# Patient Record
Sex: Male | Born: 1945 | Race: White | Hispanic: No | Smoking: Current every day smoker
Health system: Southern US, Community
[De-identification: ages and names within clinical notes are randomized; demographics above are authoritative.]

## PROBLEM LIST (undated history)

## (undated) DIAGNOSIS — I2510 Atherosclerotic heart disease of native coronary artery without angina pectoris: Secondary | ICD-10-CM

## (undated) DIAGNOSIS — I10 Essential (primary) hypertension: Secondary | ICD-10-CM

## (undated) DIAGNOSIS — R7303 Prediabetes: Secondary | ICD-10-CM

## (undated) DIAGNOSIS — E785 Hyperlipidemia, unspecified: Secondary | ICD-10-CM

## (undated) DIAGNOSIS — J45909 Unspecified asthma, uncomplicated: Secondary | ICD-10-CM

## (undated) DIAGNOSIS — I700 Atherosclerosis of aorta: Secondary | ICD-10-CM

## (undated) DIAGNOSIS — J449 Chronic obstructive pulmonary disease, unspecified: Secondary | ICD-10-CM

## (undated) DIAGNOSIS — M170 Bilateral primary osteoarthritis of knee: Secondary | ICD-10-CM

## (undated) DIAGNOSIS — R0600 Dyspnea, unspecified: Secondary | ICD-10-CM

## (undated) DIAGNOSIS — M1990 Unspecified osteoarthritis, unspecified site: Secondary | ICD-10-CM

## (undated) HISTORY — DX: Essential (primary) hypertension: I10

## (undated) HISTORY — PX: HERNIA REPAIR: SHX51

## (undated) HISTORY — DX: Atherosclerotic heart disease of native coronary artery without angina pectoris: I25.10

## (undated) HISTORY — PX: COLONOSCOPY: SHX174

## (undated) HISTORY — PX: APPENDECTOMY: SHX54

---

## 2018-10-03 ENCOUNTER — Encounter (INDEPENDENT_AMBULATORY_CARE_PROVIDER_SITE_OTHER): Payer: Self-pay

## 2018-10-24 ENCOUNTER — Encounter: Payer: Self-pay | Admitting: Family Medicine

## 2018-10-24 ENCOUNTER — Ambulatory Visit
Admission: RE | Admit: 2018-10-24 | Discharge: 2018-10-24 | Disposition: A | Discharge status: Home / Self Care | Payer: Medicare Other | Source: Ambulatory Visit | Attending: Family Medicine | Admitting: Family Medicine

## 2018-10-24 ENCOUNTER — Ambulatory Visit (INDEPENDENT_AMBULATORY_CARE_PROVIDER_SITE_OTHER): Payer: Medicare Other | Admitting: Family Medicine

## 2018-10-24 ENCOUNTER — Other Ambulatory Visit: Payer: Self-pay | Admitting: Family Medicine

## 2018-10-24 VITALS — BP 138/84 | HR 109 | Temp 98.2°F | Resp 16 | Ht 76.0 in | Wt 252.0 lb

## 2018-10-24 DIAGNOSIS — Z7689 Persons encountering health services in other specified circumstances: Secondary | ICD-10-CM | POA: Diagnosis not present

## 2018-10-24 DIAGNOSIS — J309 Allergic rhinitis, unspecified: Secondary | ICD-10-CM | POA: Insufficient documentation

## 2018-10-24 DIAGNOSIS — G8929 Other chronic pain: Secondary | ICD-10-CM

## 2018-10-24 DIAGNOSIS — M25562 Pain in left knee: Secondary | ICD-10-CM

## 2018-10-24 DIAGNOSIS — Z23 Encounter for immunization: Secondary | ICD-10-CM

## 2018-10-24 DIAGNOSIS — R030 Elevated blood-pressure reading, without diagnosis of hypertension: Secondary | ICD-10-CM | POA: Diagnosis not present

## 2018-10-24 DIAGNOSIS — M170 Bilateral primary osteoarthritis of knee: Secondary | ICD-10-CM

## 2018-10-24 DIAGNOSIS — N401 Enlarged prostate with lower urinary tract symptoms: Secondary | ICD-10-CM

## 2018-10-24 DIAGNOSIS — E669 Obesity, unspecified: Secondary | ICD-10-CM

## 2018-10-24 DIAGNOSIS — R7309 Other abnormal glucose: Secondary | ICD-10-CM

## 2018-10-24 DIAGNOSIS — M25561 Pain in right knee: Secondary | ICD-10-CM | POA: Diagnosis present

## 2018-10-24 DIAGNOSIS — J3089 Other allergic rhinitis: Secondary | ICD-10-CM

## 2018-10-24 DIAGNOSIS — N138 Other obstructive and reflux uropathy: Secondary | ICD-10-CM | POA: Insufficient documentation

## 2018-10-24 DIAGNOSIS — Z1159 Encounter for screening for other viral diseases: Secondary | ICD-10-CM

## 2018-10-24 DIAGNOSIS — M1712 Unilateral primary osteoarthritis, left knee: Secondary | ICD-10-CM | POA: Insufficient documentation

## 2018-10-24 DIAGNOSIS — I10 Essential (primary) hypertension: Secondary | ICD-10-CM | POA: Insufficient documentation

## 2018-10-24 DIAGNOSIS — Z0000 Encounter for general adult medical examination without abnormal findings: Secondary | ICD-10-CM

## 2018-10-24 DIAGNOSIS — E66811 Obesity, class 1: Secondary | ICD-10-CM

## 2018-10-24 IMAGING — DX DG KNEE COMPLETE 4+V*R*
4 series · 5 of 5 positions shown · non-contrast
Comparison: None.

CLINICAL DATA: Chronic right knee pain for years.

EXAM:
RIGHT KNEE - COMPLETE 4+ VIEW

[knee ap]
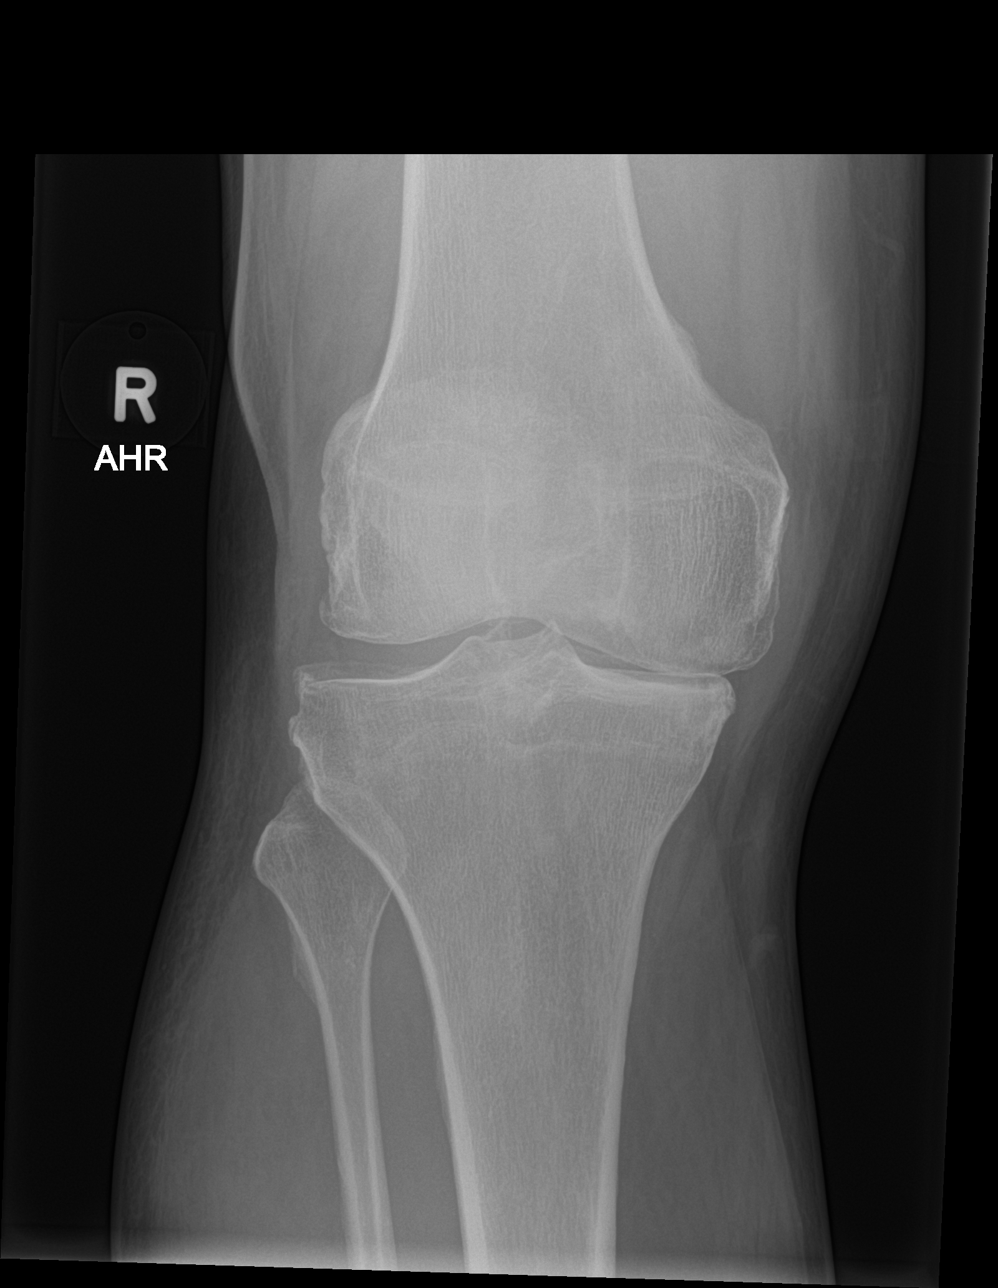

[knee tunnel]
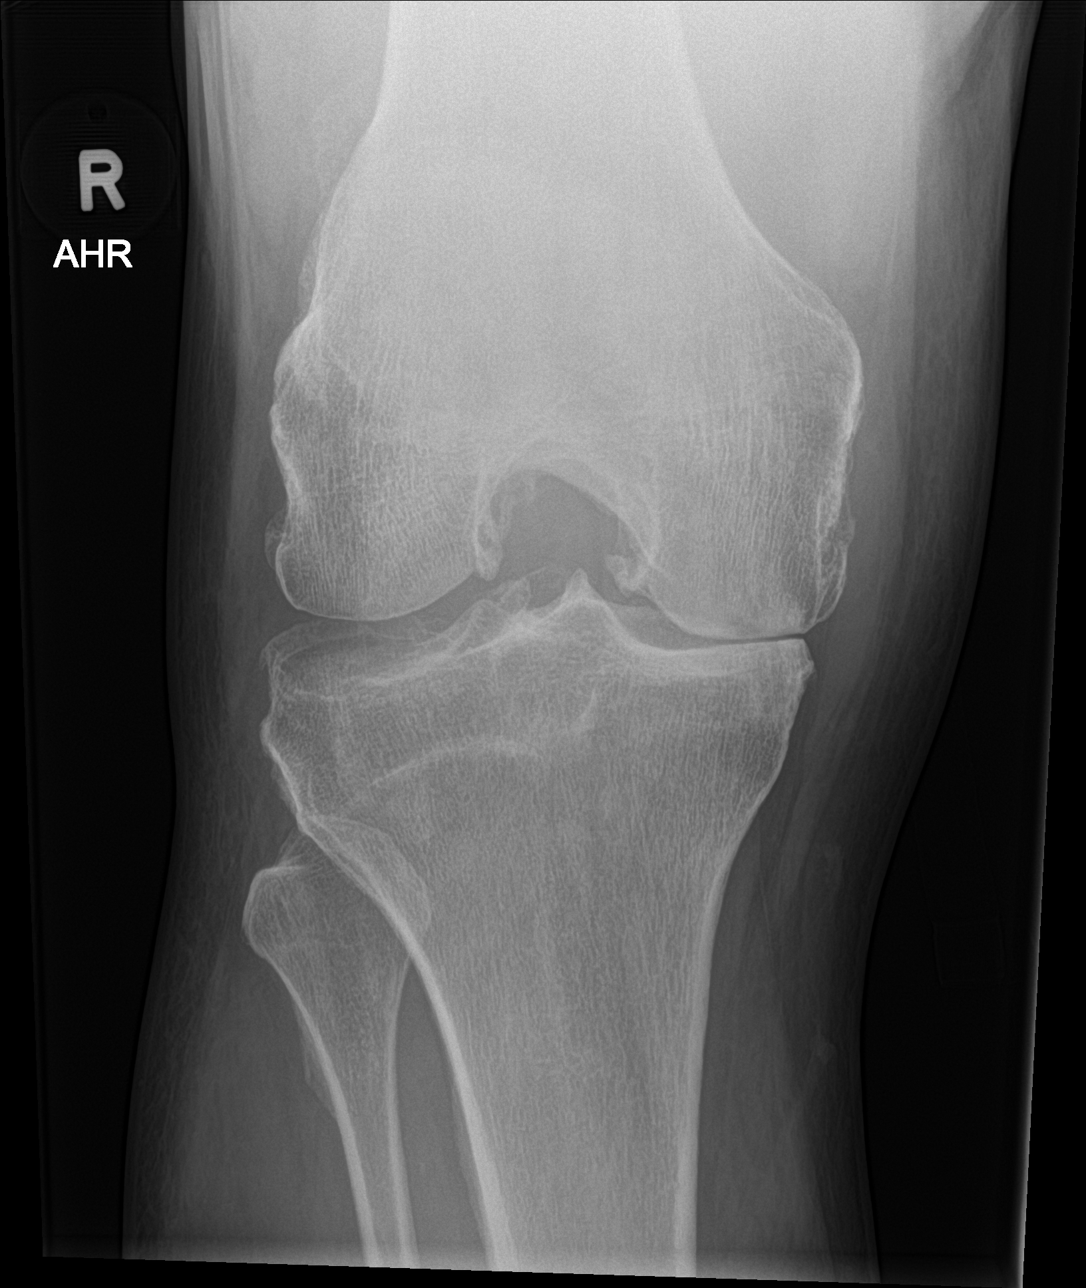

[knee lat]
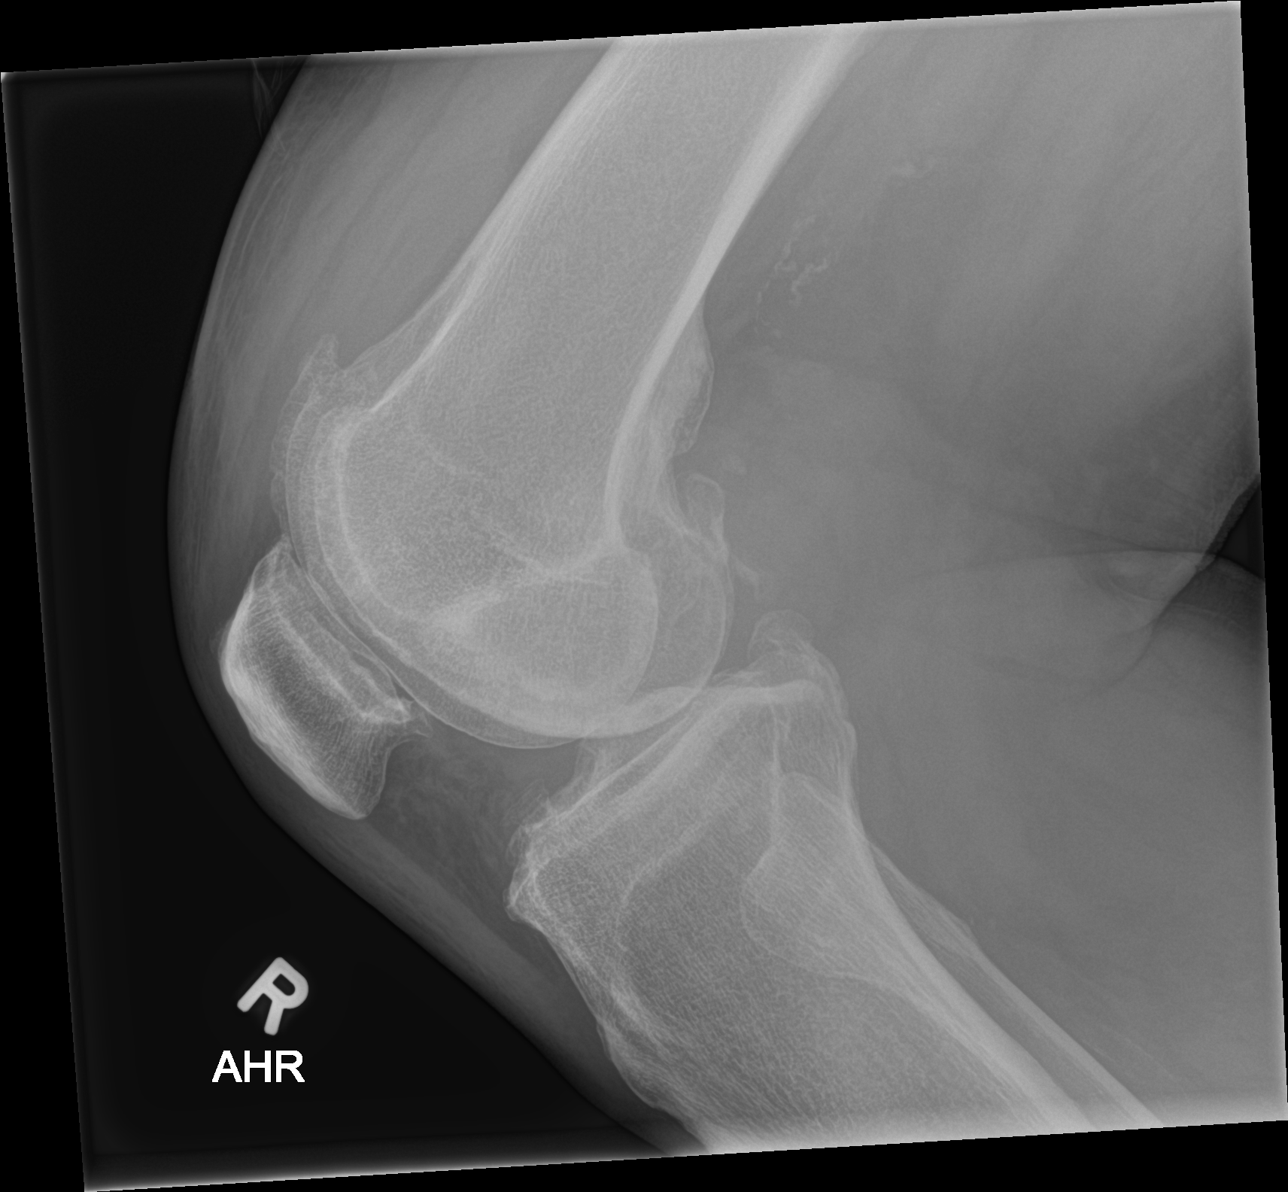

[Series 4: patella skyline · 0.14mm/px · 2 of 2 slices shown]
[im 1/2]
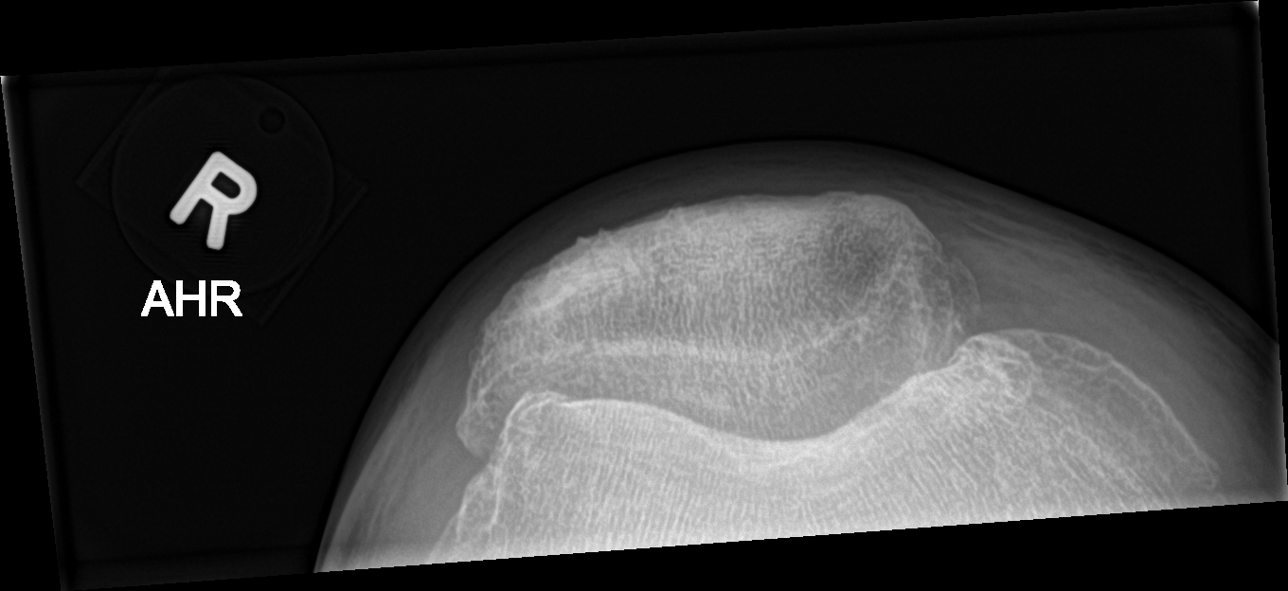
[im 2/2]
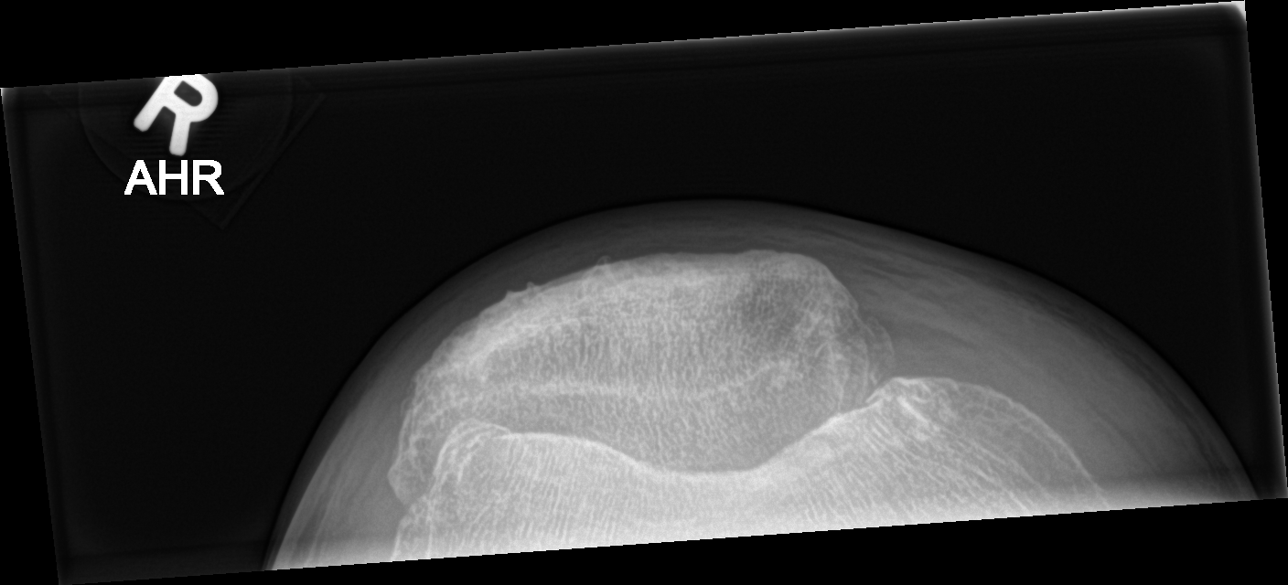

[5 of 5 positions shown; findings below may reference images not displayed]

FINDINGS: No fracture or bone lesion.

There is marked medial joint space compartment narrowing. There are
marginal osteophytes from all 3 compartments. Trace joint effusion.

Bones are demineralized.

There are posterior vascular calcifications. Soft tissues otherwise
unremarkable.
IMPRESSION: 1. No fracture or bone lesion.
2. Advanced osteoarthritis predominantly affecting the medial
compartment. Trace joint effusion.

## 2018-10-24 IMAGING — DX DG KNEE STANDING AP BILAT
1 series · 1 of 1 positions shown · non-contrast
Comparison: None.

CLINICAL DATA: Pt c/o bilateral knee pain, R > L, chronic wear and
tear, suspected osteo arthritis

EXAM:
BILATERAL KNEES STANDING - 1 VIEW

[knee ap]
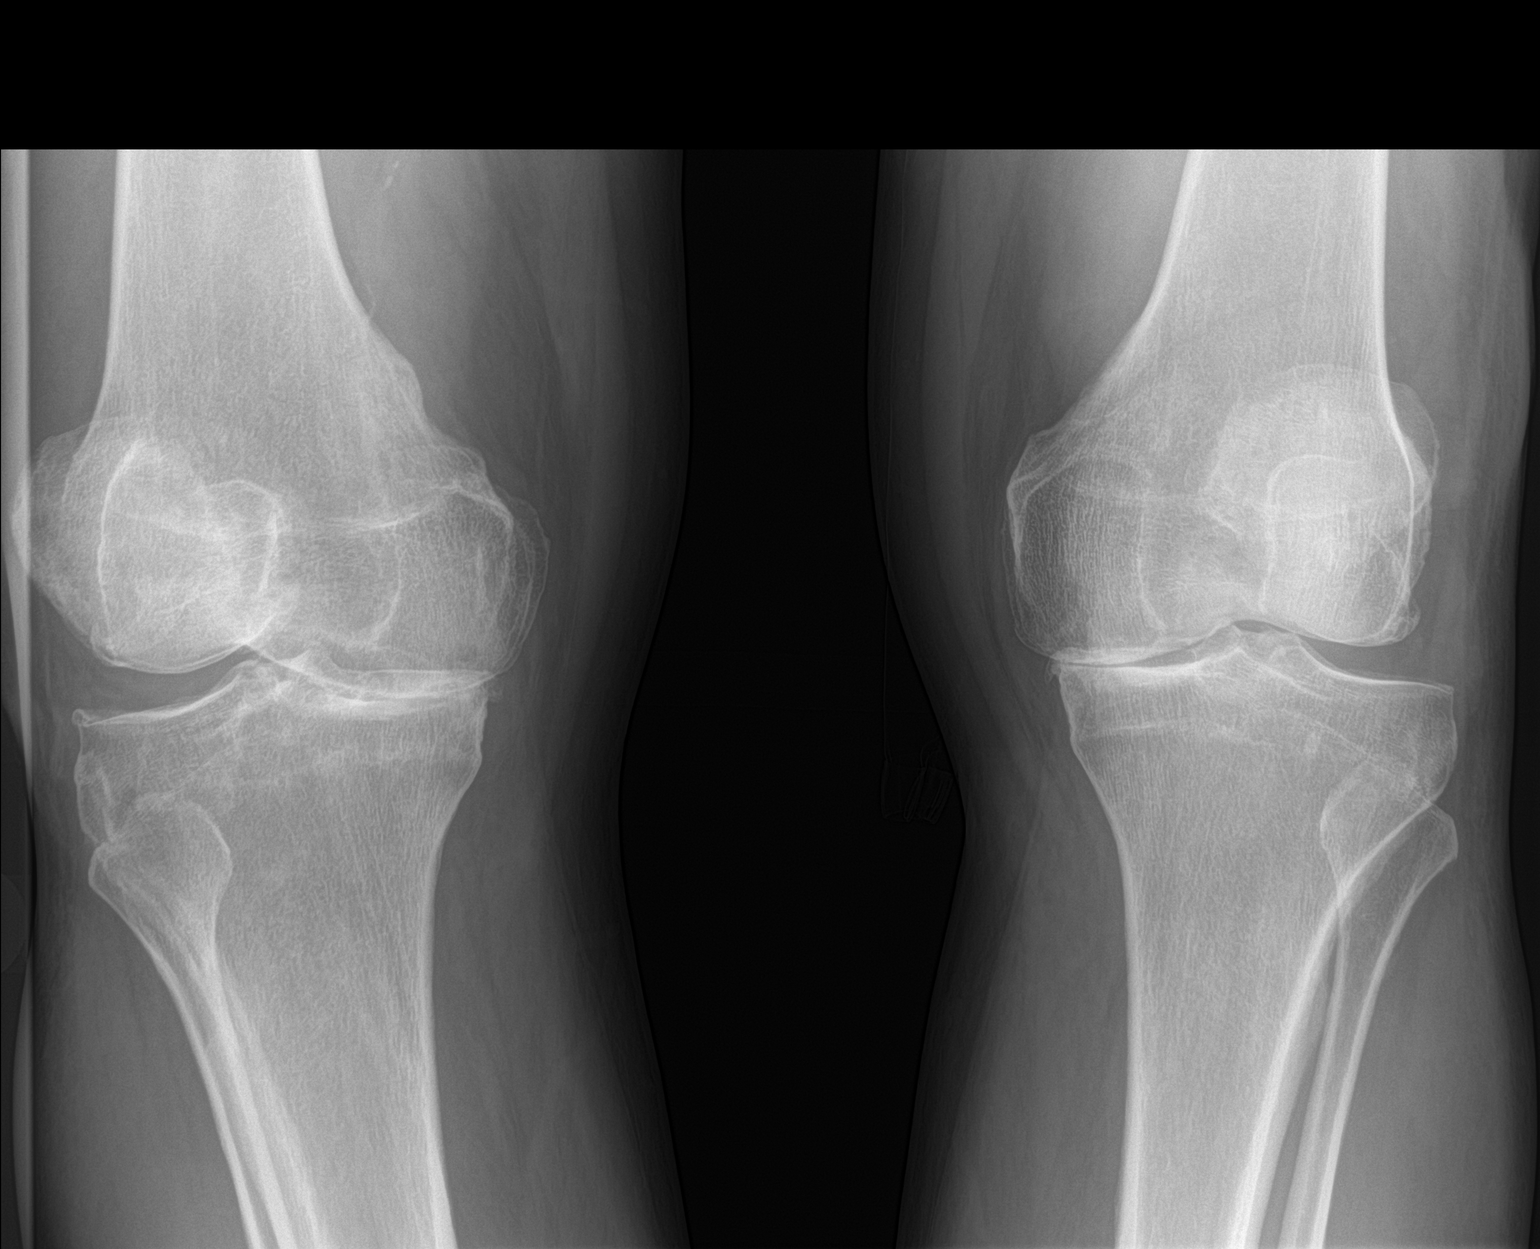

[1 of 1 positions shown; findings below may reference images not displayed]

FINDINGS: There is marked bilateral medial joint space compartment narrowing
with mild subchondral sclerosis of the medial tibial plateaus.
Marginal osteophytes project from the mediolateral compartments.
Knee appearance is relatively symmetric.

No fracture or bone lesion.

Soft tissues are unremarkable.
IMPRESSION: Advanced bilateral osteoarthritis predominantly affecting the medial
compartments. No fracture or acute finding.

## 2018-10-24 MED ORDER — FLUTICASONE PROPIONATE 50 MCG/ACT NA SUSP: 2.0000 | Freq: Every day | NASAL | 3 refills | Status: DC

## 2018-10-24 NOTE — Assessment & Plan Note (Signed)
Subacute on chronic R>L generalized Knee pain and swelling without known injury or trauma  Presumed significant OA/DJD, without confirmed on X-ray, long history of wear and tear on knees - Able to bear weight, no knee instability, mechanical locking - No prior history of knee surgery, arthroscopy - Inadequate conservative therapy   Plan: 1. Bilateral knee standing AP x-ray today w/ additional views of R knee - reviewed results, confirmed OA/DJD, see results - patient notified, continue current plan - Continue w/ NSAID OTC Aleve may notify us if interested in rx NSAID Meloxicam or Naproxen in future - Start Tylenol 500-1000mg  per dose TID PRN breakthrough - RICE therapy (rest, ice, compression, elevation) for swelling, activity modification  Follow-up 3 months, if still worsening, consider steroid injection and referral to Ortho vs PT for further eval

## 2018-10-24 NOTE — Assessment & Plan Note (Signed)
Secondary to underlying OA/DJD See A&P 

## 2018-10-24 NOTE — Assessment & Plan Note (Signed)
Encourage wt loss, diet exercise

## 2018-10-24 NOTE — Assessment & Plan Note (Signed)
Initial elevated BP May be related to knee pain arthritis, smoking and other factors Improved on re-check Advised him to keep track of home Bp readings, follow-up sooner if abnormal or persistently elevated Follow-up as scheduled 3 months for annual w/ labs

## 2018-10-24 NOTE — Progress Notes (Signed)
Subjective:    Patient ID: Jesse Gardner, male    DOB: 11/29/1945, 72 y.o.   MRN: [id number]  Jesse Gardner is a 72 y.o. male presenting on 10/24/2018 for Establish Care (knee pain right side, Elevated BP as per patient) and Osteoarthritis  [facility]. He states has been few years since previous general check up. Here to establish care.  HPI   Elevated BP without dx HTN / Obesity BMI >30 Reports history of mixed BP, has had some elevated readings, but never on anti HTN medication. He has a wrist cuff at home, checks Bp occasionally, seems variable Current Meds - None   Lifestyle: - Diet: Drinks pot of coffee daily, drinks tea. Not drinking soft drinks. - Exercise: No longer going to the gym. He has silver sneakers plan. Denies CP, dyspnea, HA, edema, dizziness / lightheadedness  Bilateral Knee Pain, Osteoarthritis Reports history of bilateral knee pain R > L for past 5 years Past history he grew up working w/ Nutritional therapistplumber, and in Electronics engineerarmy / Retail bankeraircraft mechanic, he worked in Landscape architectindustrial maintenance, - often his jobs have entailed crawling, kneeling, lifting - No prior X-rays of knees - He takes Osteo-Bioflex supplement. Aleve 250mg  x 2 in AM daily, rarely take 1-2 in PM usually just AM dose. - Not taking any Tylenol, has not tried - Tried OTC [place] Dream Cream some mild relief if aching. Tried hot or cold packs. - Not tried any knee sleeve or brace - Never had any knee surgery or shots within knees Admits some swelling at times of R>L knee Denies any recent fall or new injury, redness or bruising of knees, weakness, numbness tingling  Tobacco Abuse Active smoker >65 years, 0.5 to 1ppd. He has no formal dx of COPD or other lung problems. He is asking about Chest X-ray evaluation. He has not had CT of lungs before. Interested in this program for smoker.  Allergic Rhinosinusitis Reports persistent issue with sinus drainage, leads to cough and other symptoms. He has  difficulty now with drainage, not tried nose spray.  Health Maintenance: Due for High Dose Flu Shot, will receive today    Depression screen [facility] 10/24/2018  Decreased Interest 0  Down, Depressed, Hopeless 0  PHQ - 2 Score 0    History reviewed. No pertinent past medical history. Past Surgical History:  Procedure Laterality Date  . APPENDECTOMY    . HERNIA REPAIR     Social History   Socioeconomic History  . Marital status: Widowed    Spouse name: Not on file  . Number of children: Not on file  . Years of education: Not on file  . Highest education level: Not on file  Occupational History  . Not on file  Social Needs  . Financial resource strain: Not on file  . Food insecurity:    Worry: Not on file    Inability: Not on file  . Transportation needs:    Medical: Not on file    Non-medical: Not on file  Tobacco Use  . Smoking status: Current Every Day Smoker    Packs/day: 0.50    Years: 65.00    Pack years: 32.50    Types: Cigarettes  . Smokeless tobacco: Current User  Substance and Sexual Activity  . Alcohol use: Yes  . Drug use: Never  . Sexual activity: Not on file  Lifestyle  . Physical activity:    Days per week: Not on file    Minutes per session: Not  on file  . Stress: Not on file  Relationships  . Social connections:    Talks on phone: Not on file    Gets together: Not on file    Attends religious service: Not on file    Active member of club or organization: Not on file    Attends meetings of clubs or organizations: Not on file    Relationship status: Not on file  . Intimate partner violence:    Fear of current or ex partner: Not on file    Emotionally abused: Not on file    Physically abused: Not on file    Forced sexual activity: Not on file  Other Topics Concern  . Not on file  Social History Narrative  . Not on file   Family History  Problem Relation Age of Onset  . Alzheimer's disease Mother   . Prostate cancer Brother    No  current outpatient medications on file prior to visit.   No current facility-administered medications on file prior to visit.     Review of Systems Per HPI unless specifically indicated above     Objective:    BP 138/84 (BP Location: Left Arm, Cuff Size: Normal)   Pulse (!) 109   Temp 98.2 F (36.8 C) (Oral)   Resp 16   Ht 6\' 4"  (1.93 m)   Wt 252 lb (114.3 kg)   BMI 30.67 kg/m   Wt Readings from Last 3 Encounters:  10/24/18 252 lb (114.3 kg)    Physical Exam Vitals signs and nursing note reviewed.  Constitutional:      General: He is not in acute distress.    Appearance: He is well-developed. He is not diaphoretic.     Comments: Well-appearing, comfortable, cooperative  HENT:     Head: Normocephalic and atraumatic.  Eyes:     General:        Right eye: No discharge.        Left eye: No discharge.     Conjunctiva/sclera: Conjunctivae normal.  Neck:     Musculoskeletal: Normal range of motion and neck supple.     Thyroid: No thyromegaly.  Cardiovascular:     Rate and Rhythm: Normal rate and regular rhythm.     Heart sounds: Normal heart sounds. No murmur.  Pulmonary:     Effort: Pulmonary effort is normal. No respiratory distress.     Breath sounds: Normal breath sounds. No wheezing or rales.     Comments: Mild reduced air movement bilateral lower lung fields. Musculoskeletal:     Comments: Bilateral Knees Inspection: R>L bulky larger appearance, some localized soft tissue edema to R knee. No ecchymosis. Palpation: R mild +tenderness lateral aspect of knee. R knee very significant crepitus generalized, L knee has mild crepitus on ROM ROM: Slightly reduced knee flexion bilaterally but mostly full range actively. Special Testing: Lachman / Valgus/Varus tests negative with intact ligaments (ACL, MCL, LCL). McMurray negative without meniscus symptoms. Standing Thessaly test negative for meniscus bilaterally Strength: 5/5 intact knee flex/ext, ankle  dorsi/plantarflex Neurovascular: distally intact sensation light touch and pulses   Lymphadenopathy:     Cervical: No cervical adenopathy.  Skin:    General: Skin is warm and dry.     Findings: No erythema or rash.  Neurological:     Mental Status: He is alert and oriented to person, place, and time.  Psychiatric:        Behavior: Behavior normal.     Comments: Well groomed, good eye contact,  normal speech and thoughts    I have personally reviewed the radiology report from Bilateral Knee AP and R Knee X-rays on 10/24/18  CLINICAL DATA:  Chronic right knee pain for years.  EXAM: RIGHT KNEE - COMPLETE 4+ VIEW  COMPARISON:  None.  FINDINGS: No fracture or bone lesion.  There is marked medial joint space compartment narrowing. There are marginal osteophytes from all 3 compartments. Trace joint effusion.  Bones are demineralized.  There are posterior vascular calcifications. Soft tissues otherwise unremarkable.  IMPRESSION: 1. No fracture or bone lesion. 2. Advanced osteoarthritis predominantly affecting the medial compartment. Trace joint effusion.   Electronically Signed   By: Amie Portland M.D.   On: 10/24/2018 12:15  No results found for this or any previous visit.    Assessment & Plan:   Problem List Items Addressed This Visit    Allergic rhinitis due to allergen    Clinically without obvious sign of bacterial sinusitis Likely secondary to allergy vs smoking  Start nasal steroid Flonase 2 sprays in each nostril daily for 4-6 weeks, may repeat course seasonally or as needed      Relevant Medications   fluticasone (FLONASE) 50 MCG/ACT nasal spray   Bilateral chronic knee pain    Secondary to underlying OA/DJD See A&P      Relevant Orders   DG Knee Bilateral Standing AP (Completed)   DG Knee Complete 4 Views Right (Completed)   BPH with obstruction/lower urinary tract symptoms    Persistent chronic issue mild BPH LUTS symptoms with reduced  urinary stream Not on medication Not seen Urologist  Plan Advised to follow-up w/ labs including routine PSA at next visit and will discuss AUA BPH score and possibility of Saw Palmetto vs Flomax      Elevated BP without diagnosis of hypertension    Initial elevated BP May be related to knee pain arthritis, smoking and other factors Improved on re-check Advised him to keep track of home Bp readings, follow-up sooner if abnormal or persistently elevated Follow-up as scheduled 3 months for annual w/ labs      Obesity (BMI 30.0-34.9)    Encourage wt loss, diet exercise      Osteoarthritis of knees, bilateral - Primary    Subacute on chronic R>L generalized Knee pain and swelling without known injury or trauma  Presumed significant OA/DJD, without confirmed on X-ray, long history of wear and tear on knees - Able to bear weight, no knee instability, mechanical locking - No prior history of knee surgery, arthroscopy - Inadequate conservative therapy   Plan: 1. Bilateral knee standing AP x-ray today w/ additional views of R knee - reviewed results, confirmed OA/DJD, see results - patient notified, continue current plan - Continue w/ NSAID OTC Aleve may notify us if interested in rx NSAID Meloxicam or Naproxen in future - Start Tylenol 500-1000mg  per dose TID PRN breakthrough - RICE therapy (rest, ice, compression, elevation) for swelling, activity modification  Follow-up 3 months, if still worsening, consider steroid injection and referral to Ortho vs PT for further eval      Relevant Orders   DG Knee Bilateral Standing AP (Completed)   DG Knee Complete 4 Views Right (Completed)    Other Visit Diagnoses    Encounter to establish care with new doctor       Needs flu shot       Relevant Orders   Flu vaccine HIGH DOSE PF (Completed)      Meds ordered this encounter  Medications  . fluticasone (FLONASE) 50 MCG/ACT nasal spray    Sig: Place 2 sprays into both nostrils daily. Use  for 4-6 weeks then stop and use seasonally or as needed.    Dispense:  16 g    Refill:  3    Follow up plan: Return in about 3 months (around 01/23/2019) for Annual Physical.   Future labs ordered for 01/19/19 - including   Saralyn Pilar, DO [facility] 10/24/2018, 10:38 AM

## 2018-10-24 NOTE — Assessment & Plan Note (Signed)
Persistent chronic issue mild BPH LUTS symptoms with reduced urinary stream Not on medication Not seen Urologist  Plan Advised to follow-up w/ labs including routine PSA at next visit and will discuss AUA BPH score and possibility of Saw Palmetto vs Flomax

## 2018-10-24 NOTE — Patient Instructions (Addendum)
Thank you for coming to the office today.  For Knees - most likely arthritis, wear and tear - X-ray of Knees today, stay tuned for results  Recommend to start taking Tylenol Extra Strength 500mg  tabs - take 1 to 2 tabs per dose (max 1000mg ) every 6-8 hours for pain (take regularly, don't skip a dose for next 7 days), max 24 hour daily dose is 6 tablets or 3000mg . In the future you can repeat the same everyday Tylenol course for 1-2 weeks at a time.  - This is safe to take with anti-inflammatory medicines (Aleve)  Also I would recommend a knee sleeve for compression  --------------------------  Low Dose Chest CT Lung CA Screening - Age 72-74 - Smoking history >30 pack years - Annual Physical  [facility] Glenna FellowsShawn Perkins, RN, OCN  [facility] Smoking Cessation Class Ph: [phone]  DUE for FASTING BLOOD WORK (no food or drink after midnight before the lab appointment, only water or coffee without cream/sugar on the morning of)  SCHEDULE "Lab Only" visit in the morning at the clinic for lab draw in 3 MONTHS   - Make sure Lab Only appointment is at about 1 week before your next appointment, so that results will be available  For Lab Results, once available within 2-3 days of blood draw, you can can log in to MyChart online to view your results and a brief explanation. Also, we can discuss results at next follow-up visit.    Please schedule a Follow-up Appointment to: Return in about 3 months (around 01/23/2019) for Annual Physical.  If you have any other questions or concerns, please feel free to call the office or send a message through MyChart. You may also schedule an earlier appointment if necessary.  Additionally, you may be receiving a survey about your experience at our office within a few days to 1 week by e-mail or mail. We value your feedback.  Saralyn PilarAlexander Karamalegos, DO [facility]

## 2018-10-24 NOTE — Assessment & Plan Note (Signed)
Clinically without obvious sign of bacterial sinusitis Likely secondary to allergy vs smoking  Start nasal steroid Flonase 2 sprays in each nostril daily for 4-6 weeks, may repeat course seasonally or as needed

## 2018-10-27 ENCOUNTER — Telehealth: Payer: Self-pay | Admitting: *Deleted

## 2018-10-27 ENCOUNTER — Ambulatory Visit: Payer: Self-pay | Admitting: Family Medicine

## 2018-10-27 DIAGNOSIS — Z122 Encounter for screening for malignant neoplasm of respiratory organs: Secondary | ICD-10-CM

## 2018-10-27 DIAGNOSIS — Z87891 Personal history of nicotine dependence: Secondary | ICD-10-CM

## 2018-10-27 NOTE — Telephone Encounter (Signed)
Received referral for initial lung cancer screening scan. Contacted patient and obtained smoking history,(current, 103.5 pack year) as well as answering questions related to screening process. Patient denies signs of lung cancer such as weight loss or hemoptysis. Patient denies comorbidity that would prevent curative treatment if lung cancer were found. Patient is scheduled for shared decision making visit and CT scan on 11/06/18 at 215pm.

## 2018-10-30 ENCOUNTER — Telehealth: Payer: Self-pay | Admitting: Family Medicine

## 2018-10-30 NOTE — Telephone Encounter (Signed)
I can have paperwork completed for his handicap placard by tomorrow Friday 10/31/18. He may either pick it up anytime from office starting Friday or can be picked up next week. Or we can mail it to him.  Let me know what he prefers.  Saralyn Pilar, DO [facility] 10/30/2018, 2:12 PM

## 2018-10-30 NOTE — Telephone Encounter (Signed)
Pt asked about getting a handicap placard.  His call back number is [phone]

## 2018-10-30 NOTE — Telephone Encounter (Signed)
Patient's preference to pick it up from the office tomorrow.

## 2018-11-03 ENCOUNTER — Telehealth: Payer: Self-pay | Admitting: *Deleted

## 2018-11-03 NOTE — Telephone Encounter (Signed)
Called patient to remind him of his appt for ldct screening on 11-06-2018 at 1415, voiced understanding.

## 2018-11-06 ENCOUNTER — Encounter: Payer: Self-pay | Admitting: Oncology

## 2018-11-06 ENCOUNTER — Ambulatory Visit
Admission: RE | Admit: 2018-11-06 | Discharge: 2018-11-06 | Disposition: A | Discharge status: Home / Self Care | Payer: Medicare Other | Source: Ambulatory Visit | Attending: Oncology | Admitting: Oncology

## 2018-11-06 ENCOUNTER — Inpatient Hospital Stay: Payer: Medicare Other | Attending: Nurse Practitioner | Admitting: Oncology

## 2018-11-06 DIAGNOSIS — Z122 Encounter for screening for malignant neoplasm of respiratory organs: Secondary | ICD-10-CM | POA: Diagnosis present

## 2018-11-06 DIAGNOSIS — Z87891 Personal history of nicotine dependence: Secondary | ICD-10-CM | POA: Diagnosis present

## 2018-11-06 IMAGING — CT CT CHEST LUNG CANCER SCREENING LOW DOSE W/O CM
2 of 5 series · 15 of 40 positions shown, 18 images · non-contrast
Comparison: No priors.

CLINICAL DATA: 72-year-old male current smoker with 104 pack-year
history of smoking. Lung cancer screening examination.

EXAM:
CT CHEST WITHOUT CONTRAST LOW-DOSE FOR LUNG CANCER SCREENING
TECHNIQUE: Multidetector CT imaging of the chest was performed following the
standard protocol without IV contrast.

[Series 3: lung · axial · 0.75mm/px · z∈[-1240,-908]mm · 12 of 368 slices shown, 15 images (1 of 2)]
[im 18/368  mediastinal]
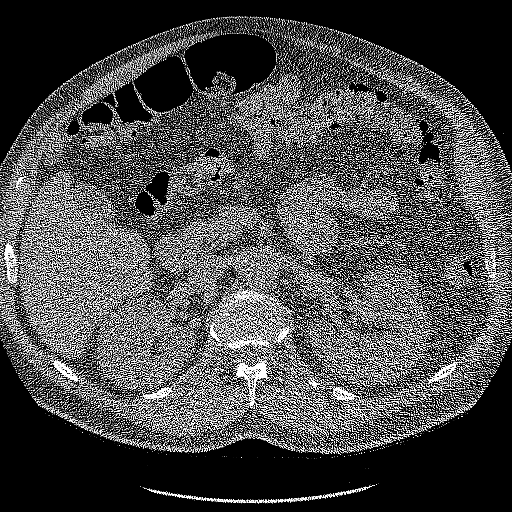
[im 18/368  lung]
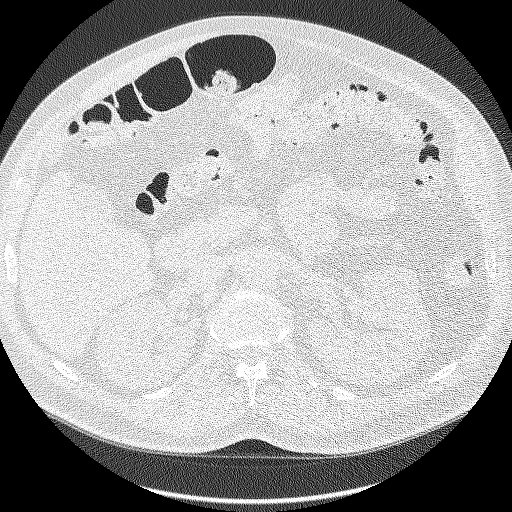
[im 53/368  lung]
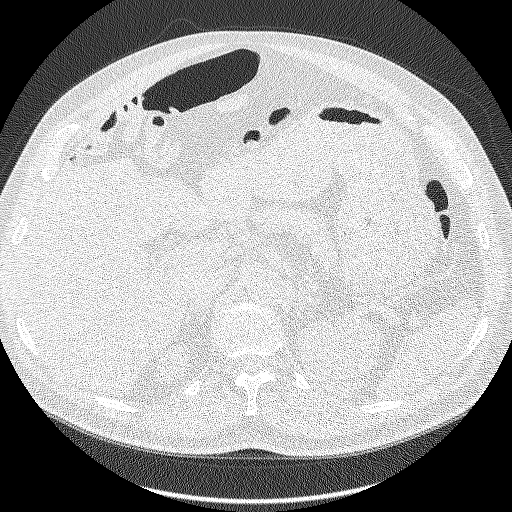
[im 88/368  lung]
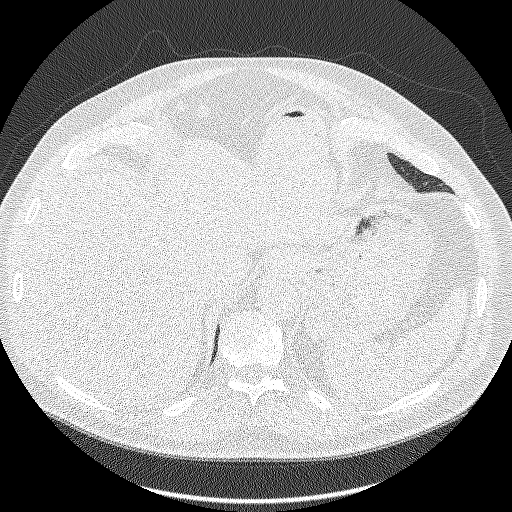
[im 105/368  lung]
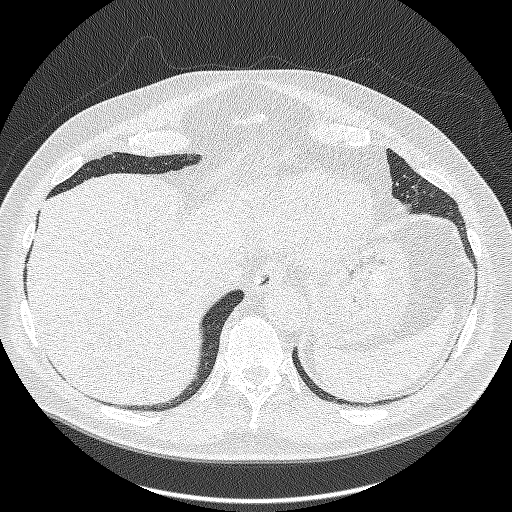
[im 140/368  mediastinal]
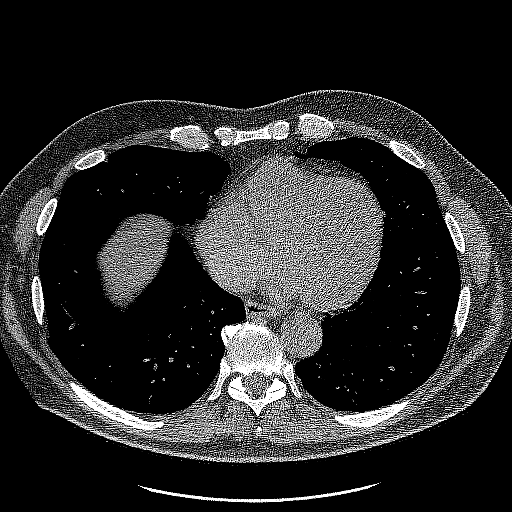
[im 140/368  lung]
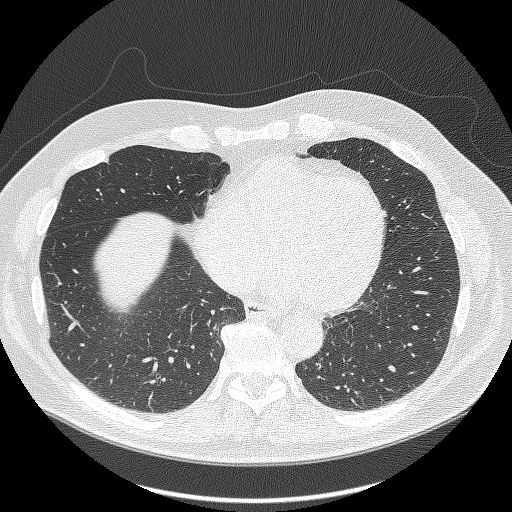
[im 175/368  lung]
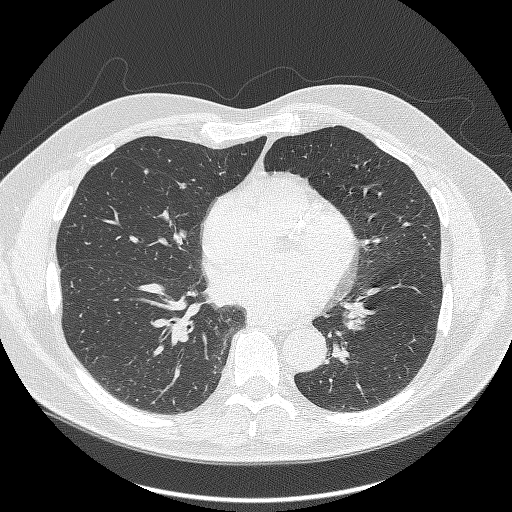
[im 193/368  lung]
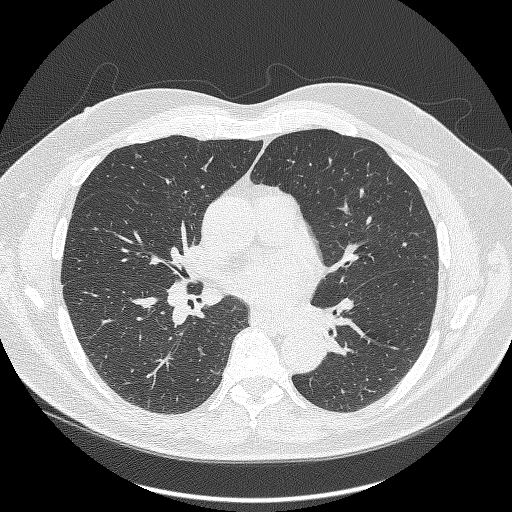
[im 228/368  lung]
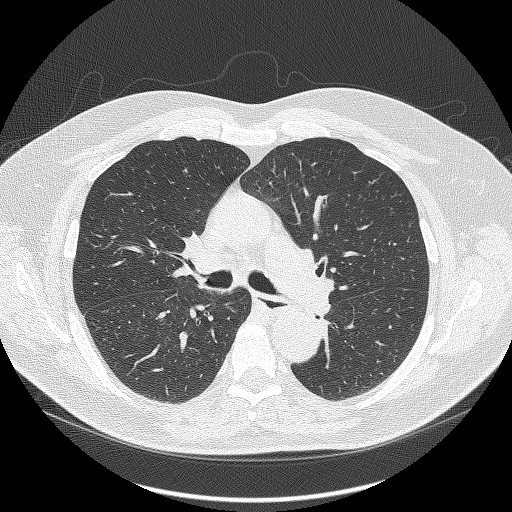
[im 263/368  mediastinal]
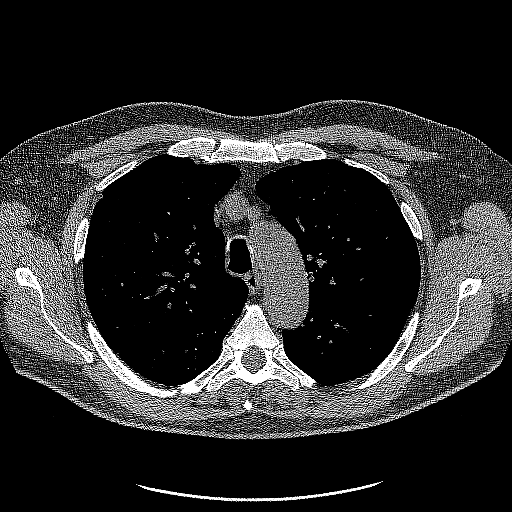
[im 263/368  lung]
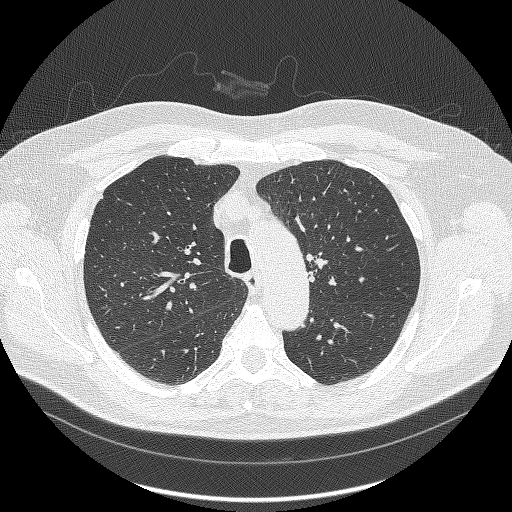
[im 280/368  lung]
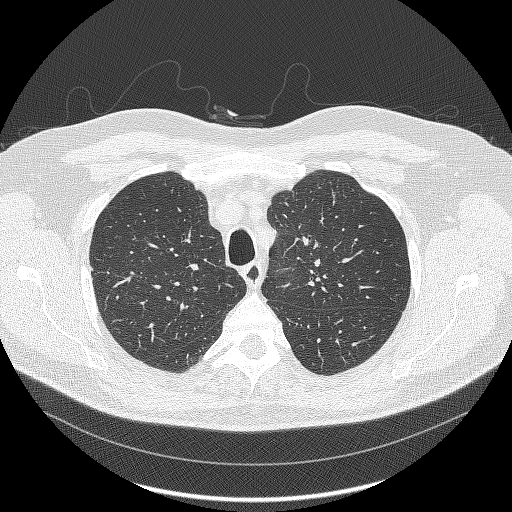
[im 315/368  lung]
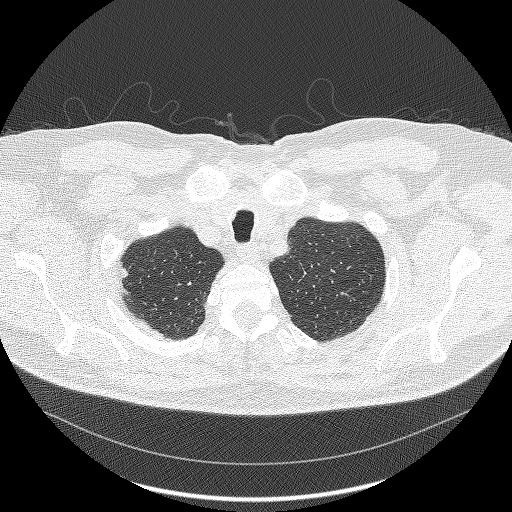
[im 350/368  lung]
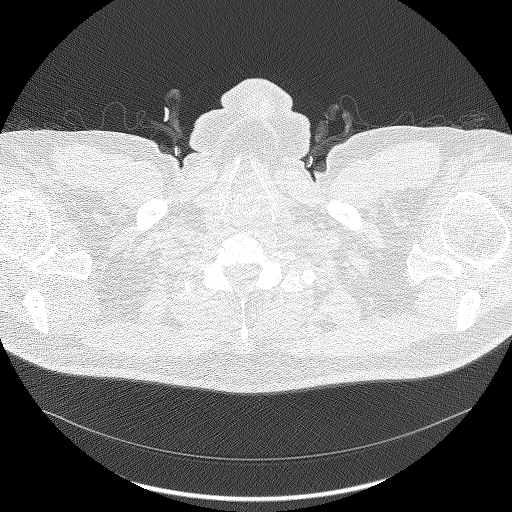

[Series 4: lung · coronal · 0.72mm/px · 3 of 268 slices shown (2 of 2)]
[im 54/268  lung]
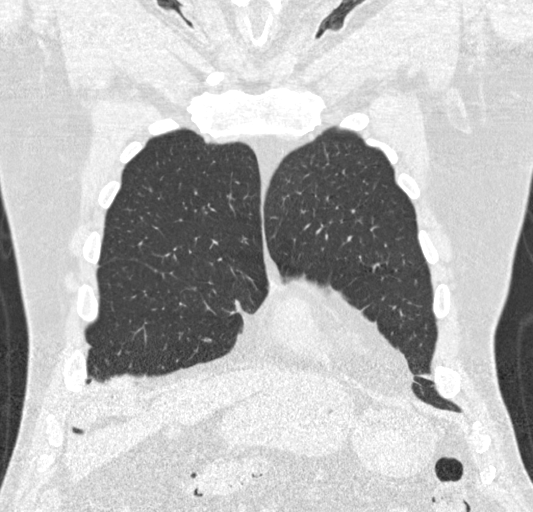
[im 107/268  lung]
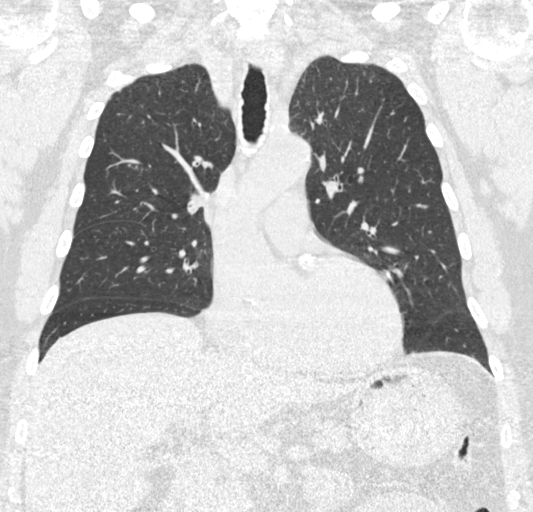
[im 161/268  lung]
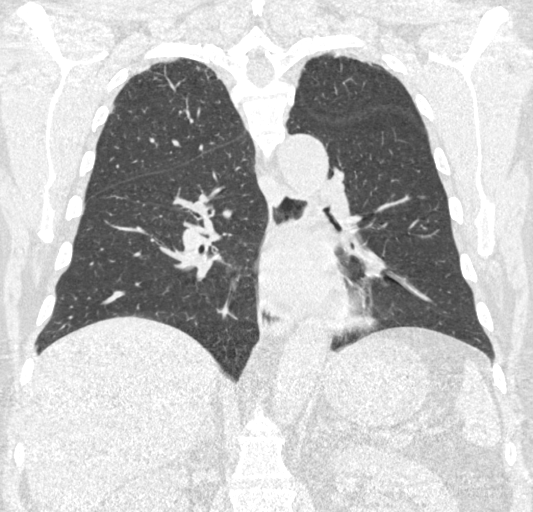

[15 of 40 positions shown; findings below may reference images not displayed]

FINDINGS: Cardiovascular: Heart size is normal. There is no significant
pericardial fluid, thickening or pericardial calcification. There is
aortic atherosclerosis, as well as atherosclerosis of the great
vessels of the mediastinum and the coronary arteries, including
calcified atherosclerotic plaque in the left main, left anterior
descending, left circumflex and right coronary arteries.
Calcifications of the aortic valve.

Mediastinum/Nodes: No pathologically enlarged mediastinal or hilar
lymph nodes. Please note that accurate exclusion of hilar adenopathy
is limited on noncontrast CT scans. Esophagus is unremarkable in
appearance. No axillary lymphadenopathy.

Lungs/Pleura: Multiple small pulmonary nodules are scattered
throughout both lungs, largest of which is in the right middle lobe
abutting the minor fissure (axial image 201 of series 3), with a
volume derived mean diameter 5.4 mm. No larger more suspicious
appearing pulmonary nodules or masses are noted. No acute
consolidative airspace disease. No pleural effusions. Mild diffuse
bronchial wall thickening with mild very centrilobular and
paraseptal emphysema.

Upper Abdomen: Aortic atherosclerosis. Incompletely imaged
low-attenuation lesion measuring at least 6.1 cm extending from the
anterior aspect of the interpolar region of the right kidney, not
characterized on today's noncontrast CT examination, but
statistically likely a large cyst.

Musculoskeletal: There are no aggressive appearing lytic or blastic
lesions noted in the visualized portions of the skeleton.
IMPRESSION: 1. Lung-RADS 2S, benign appearance or behavior. Continue annual
screening with low-dose chest CT without contrast in 12 months.
2. The "S" modifier above refers to potentially clinically
significant non lung cancer related findings. Specifically, there is
aortic atherosclerosis, in addition to left main and 3 vessel
coronary artery disease. Assessment for potential risk factor
modification, dietary therapy or pharmacologic therapy may be
warranted, if clinically indicated.
3. Mild diffuse bronchial wall thickening with very mild
centrilobular and paraseptal emphysema; imaging findings suggestive
of underlying COPD.

Aortic Atherosclerosis ([OE]-[OE]) and Emphysema ([OE]-[OE]).

## 2018-11-06 NOTE — Progress Notes (Signed)
In accordance with CMS guidelines, patient has met eligibility criteria including age, absence of signs or symptoms of lung cancer.  Social History   Tobacco Use  . Smoking status: Current Every Day Smoker    Packs/day: 1.82    Years: 57.00    Pack years: 103.74    Types: Cigarettes  . Smokeless tobacco: Current User  Substance Use Topics  . Alcohol use: Yes  . Drug use: Never     A shared decision-making session was conducted prior to the performance of CT scan. This includes one or more decision aids, includes benefits and harms of screening, follow-up diagnostic testing, over-diagnosis, false positive rate, and total radiation exposure.  Counseling on the importance of adherence to annual lung cancer LDCT screening, impact of co-morbidities, and ability or willingness to undergo diagnosis and treatment is imperative for compliance of the program.  Counseling on the importance of continued smoking cessation for former smokers; the importance of smoking cessation for current smokers, and information about tobacco cessation interventions have been given to patient including [place] and [place] quit [place] programs.  Written order for lung cancer screening with LDCT has been given to the patient and any and all questions have been answered to the best of my abilities.   Yearly follow up will be coordinated by Burgess Estelle, Thoracic Navigator.  Faythe Casa, NP 11/06/2018 2:58 PM

## 2018-11-07 ENCOUNTER — Encounter: Payer: Self-pay | Admitting: *Deleted

## 2019-01-19 ENCOUNTER — Other Ambulatory Visit: Payer: Self-pay

## 2019-01-19 ENCOUNTER — Other Ambulatory Visit: Payer: Medicare Other

## 2019-01-19 DIAGNOSIS — M170 Bilateral primary osteoarthritis of knee: Secondary | ICD-10-CM

## 2019-01-19 DIAGNOSIS — E669 Obesity, unspecified: Secondary | ICD-10-CM

## 2019-01-19 DIAGNOSIS — N138 Other obstructive and reflux uropathy: Secondary | ICD-10-CM

## 2019-01-19 DIAGNOSIS — R7309 Other abnormal glucose: Secondary | ICD-10-CM

## 2019-01-19 DIAGNOSIS — R030 Elevated blood-pressure reading, without diagnosis of hypertension: Secondary | ICD-10-CM

## 2019-01-19 DIAGNOSIS — Z1159 Encounter for screening for other viral diseases: Secondary | ICD-10-CM

## 2019-01-19 DIAGNOSIS — N401 Enlarged prostate with lower urinary tract symptoms: Secondary | ICD-10-CM

## 2019-01-19 DIAGNOSIS — Z0000 Encounter for general adult medical examination without abnormal findings: Secondary | ICD-10-CM

## 2019-01-20 LAB — CBC WITH DIFFERENTIAL/PLATELET
Absolute Monocytes: 1111 cells/uL — ABNORMAL HIGH (ref 200–950)
Basophils Absolute: 44 cells/uL (ref 0–200)
Basophils Relative: 0.4 %
Eosinophils Absolute: 308 cells/uL (ref 15–500)
Eosinophils Relative: 2.8 %
HCT: 43.8 % (ref 38.5–50.0)
Hemoglobin: 15.1 g/dL (ref 13.2–17.1)
Lymphs Abs: 3234 cells/uL (ref 850–3900)
MCH: 29.4 pg (ref 27.0–33.0)
MCHC: 34.5 g/dL (ref 32.0–36.0)
MCV: 85.2 fL (ref 80.0–100.0)
MPV: 12.1 fL (ref 7.5–12.5)
Monocytes Relative: 10.1 %
NEUTROS PCT: 57.3 %
Neutro Abs: 6303 cells/uL (ref 1500–7800)
Platelets: 212 10*3/uL (ref 140–400)
RBC: 5.14 10*6/uL (ref 4.20–5.80)
RDW: 12.7 % (ref 11.0–15.0)
Total Lymphocyte: 29.4 %
WBC: 11 10*3/uL — ABNORMAL HIGH (ref 3.8–10.8)

## 2019-01-20 LAB — COMPLETE METABOLIC PANEL WITH GFR
AG Ratio: 1.6 (calc) (ref 1.0–2.5)
ALT: 23 U/L (ref 9–46)
AST: 23 U/L (ref 10–35)
Albumin: 4.6 g/dL (ref 3.6–5.1)
Alkaline phosphatase (APISO): 58 U/L (ref 35–144)
BILIRUBIN TOTAL: 0.5 mg/dL (ref 0.2–1.2)
BUN: 21 mg/dL (ref 7–25)
CHLORIDE: 105 mmol/L (ref 98–110)
CO2: 28 mmol/L (ref 20–32)
Calcium: 9.6 mg/dL (ref 8.6–10.3)
Creat: 0.72 mg/dL (ref 0.70–1.18)
GFR, Est African American: 108 mL/min/{1.73_m2} (ref >=60)
GFR, Est Non African American: 93 mL/min/{1.73_m2} (ref >=60)
Globulin: 2.8 g/dL (calc) (ref 1.9–3.7)
Glucose, Bld: 101 mg/dL — ABNORMAL HIGH (ref 65–99)
Potassium: 4.2 mmol/L (ref 3.5–5.3)
Sodium: 141 mmol/L (ref 135–146)
Total Protein: 7.4 g/dL (ref 6.1–8.1)

## 2019-01-20 LAB — HEMOGLOBIN A1C
Hgb A1c MFr Bld: 6.2 % of total Hgb — ABNORMAL HIGH (ref <5.7)
Mean Plasma Glucose: 131 (calc)
eAG (mmol/L): 7.3 (calc)

## 2019-01-20 LAB — LIPID PANEL
Cholesterol: 220 mg/dL — ABNORMAL HIGH (ref <200)
HDL: 43 mg/dL (ref >=40)
LDL CHOLESTEROL (CALC): 153 mg/dL — AB
Non-HDL Cholesterol (Calc): 177 mg/dL (calc) — ABNORMAL HIGH (ref <130)
TRIGLYCERIDES: 119 mg/dL (ref <150)
Total CHOL/HDL Ratio: 5.1 (calc) — ABNORMAL HIGH (ref <5.0)

## 2019-01-20 LAB — HEPATITIS C ANTIBODY
Hepatitis C Ab: NONREACTIVE
SIGNAL TO CUT-OFF: 0.04 (ref <1.00)

## 2019-01-20 LAB — PSA: PSA: 1.8 ng/mL (ref <=4.0)

## 2019-01-22 ENCOUNTER — Encounter: Payer: Self-pay | Admitting: Family Medicine

## 2019-01-22 DIAGNOSIS — E7800 Pure hypercholesterolemia, unspecified: Secondary | ICD-10-CM | POA: Insufficient documentation

## 2019-01-22 DIAGNOSIS — E1169 Type 2 diabetes mellitus with other specified complication: Secondary | ICD-10-CM | POA: Insufficient documentation

## 2019-01-22 DIAGNOSIS — R7303 Prediabetes: Secondary | ICD-10-CM | POA: Insufficient documentation

## 2019-01-26 ENCOUNTER — Encounter: Payer: Medicare Other | Admitting: Family Medicine

## 2019-04-02 ENCOUNTER — Ambulatory Visit (INDEPENDENT_AMBULATORY_CARE_PROVIDER_SITE_OTHER): Payer: Medicare Other | Admitting: Family Medicine

## 2019-04-02 ENCOUNTER — Encounter: Payer: Self-pay | Admitting: Family Medicine

## 2019-04-02 ENCOUNTER — Other Ambulatory Visit: Payer: Self-pay

## 2019-04-02 VITALS — BP 161/90 | HR 99 | Temp 98.1°F | Ht 76.0 in | Wt 250.8 lb

## 2019-04-02 DIAGNOSIS — Z0000 Encounter for general adult medical examination without abnormal findings: Secondary | ICD-10-CM | POA: Diagnosis not present

## 2019-04-02 DIAGNOSIS — I10 Essential (primary) hypertension: Secondary | ICD-10-CM

## 2019-04-02 DIAGNOSIS — R7303 Prediabetes: Secondary | ICD-10-CM

## 2019-04-02 DIAGNOSIS — E669 Obesity, unspecified: Secondary | ICD-10-CM | POA: Diagnosis not present

## 2019-04-02 DIAGNOSIS — J432 Centrilobular emphysema: Secondary | ICD-10-CM | POA: Diagnosis not present

## 2019-04-02 DIAGNOSIS — E7800 Pure hypercholesterolemia, unspecified: Secondary | ICD-10-CM

## 2019-04-02 MED ORDER — LOSARTAN POTASSIUM 50 MG PO TABS: 50.0000 mg | ORAL_TABLET | Freq: Every day | ORAL | 3 refills | Status: DC

## 2019-04-02 NOTE — Patient Instructions (Addendum)
Thank you for coming to the office today.  START new medication for high blood pressure - Losartan 50mg , once daily, if you develop facial swelling or lip swelling, or short of breath reaction - STOP taking and call us or go to hospital or urgent care immediately  Try to improve diet and eat low sugar low carb diet  Stay active  DUE for FASTING BLOOD WORK (no food or drink after midnight before the lab appointment, only water or coffee without cream/sugar on the morning of)  SCHEDULE "Lab Only" visit in the morning at the clinic for lab draw in 3 MONTHS   - Make sure Lab Only appointment is at about 1 week before your next appointment, so that results will be available  For Lab Results, once available within 2-3 days of blood draw, you can can log in to MyChart online to view your results and a brief explanation. Also, we can discuss results at next follow-up visit.    Please schedule a Follow-up Appointment to: Return in about 3 months (around 07/03/2019) for HTN, PreDM result.  If you have any other questions or concerns, please feel free to call the office or send a message through MyChart. You may also schedule an earlier appointment if necessary.  Additionally, you may be receiving a survey about your experience at our office within a few days to 1 week by e-mail or mail. We value your feedback.  Saralyn Pilar, DO [facility], [facility]   Heart-Healthy Eating Plan Many factors influence your heart (coronary) health, including eating and exercise habits. Coronary risk increases with abnormal blood fat (lipid) levels. Heart-healthy meal planning includes limiting unhealthy fats, increasing healthy fats, and making other diet and lifestyle changes. What is my plan? Your health care provider may recommend that you:  Limit your fat intake to _________% or less of your total calories each day.  Limit your saturated fat intake to _________% or less of your total  calories each day.  Limit the amount of cholesterol in your diet to less than _________ mg per day. What are tips for following this plan? Cooking Cook foods using methods other than frying. Baking, boiling, grilling, and broiling are all good options. Other ways to reduce fat include:  Removing the skin from poultry.  Removing all visible fats from meats.  Steaming vegetables in water or broth. Meal planning   At meals, imagine dividing your plate into fourths: ? Fill one-half of your plate with vegetables and green salads. ? Fill one-fourth of your plate with whole grains. ? Fill one-fourth of your plate with lean protein foods.  Eat 4-5 servings of vegetables per day. One serving equals 1 cup raw or cooked vegetable, or 2 cups raw leafy greens.  Eat 4-5 servings of fruit per day. One serving equals 1 medium whole fruit,  cup dried fruit,  cup fresh, frozen, or canned fruit, or  cup 100% fruit juice.  Eat more foods that contain soluble fiber. Examples include apples, broccoli, carrots, beans, peas, and barley. Aim to get 25-30 g of fiber per day.  Increase your consumption of legumes, nuts, and seeds to 4-5 servings per week. One serving of dried beans or legumes equals  cup cooked, 1 serving of nuts is  cup, and 1 serving of seeds equals 1 tablespoon. Fats  Choose healthy fats more often. Choose monounsaturated and polyunsaturated fats, such as olive and canola oils, flaxseeds, walnuts, almonds, and seeds.  Eat more omega-3 fats. Choose  salmon, mackerel, sardines, tuna, flaxseed oil, and ground flaxseeds. Aim to eat fish at least 2 times each week.  Check food labels carefully to identify foods with trans fats or high amounts of saturated fat.  Limit saturated fats. These are found in animal products, such as meats, butter, and cream. Plant sources of saturated fats include palm oil, palm kernel oil, and coconut oil.  Avoid foods with partially hydrogenated oils in them.  These contain trans fats. Examples are stick margarine, some tub margarines, cookies, crackers, and other baked goods.  Avoid fried foods. General information  Eat more home-cooked food and less restaurant, buffet, and fast food.  Limit or avoid alcohol.  Limit foods that are high in starch and sugar.  Lose weight if you are overweight. Losing just 5-10% of your body weight can help your overall health and prevent diseases such as diabetes and heart disease.  Monitor your salt (sodium) intake, especially if you have high blood pressure. Talk with your health care provider about your sodium intake.  Try to incorporate more vegetarian meals weekly. What foods can I eat? Fruits All fresh, canned (in natural juice), or frozen fruits. Vegetables Fresh or frozen vegetables (raw, steamed, roasted, or grilled). Green salads. Grains Most grains. Choose whole wheat and whole grains most of the time. Rice and pasta, including brown rice and pastas made with whole wheat. Meats and other proteins Lean, well-trimmed beef, veal, pork, and lamb. Chicken and [place] without skin. All fish and shellfish. Wild duck, rabbit, pheasant, and venison. Egg whites or low-cholesterol egg substitutes. Dried beans, peas, lentils, and tofu. Seeds and most nuts. Dairy Low-fat or nonfat cheeses, including ricotta and mozzarella. Skim or 1% milk (liquid, powdered, or evaporated). Buttermilk made with low-fat milk. Nonfat or low-fat yogurt. Fats and oils Non-hydrogenated (trans-free) margarines. Vegetable oils, including soybean, sesame, sunflower, olive, peanut, safflower, corn, canola, and cottonseed. Salad dressings or mayonnaise made with a vegetable oil. Beverages Water (mineral or sparkling). Coffee and tea. Diet carbonated beverages. Sweets and desserts Sherbet, gelatin, and fruit ice. Small amounts of dark chocolate. Limit all sweets and desserts. Seasonings and condiments All seasonings and condiments. The  items listed above may not be a complete list of foods and beverages you can eat. Contact a dietitian for more options. What foods are not recommended? Fruits Canned fruit in heavy syrup. Fruit in cream or butter sauce. Fried fruit. Limit coconut. Vegetables Vegetables cooked in cheese, cream, or butter sauce. Fried vegetables. Grains Breads made with saturated or trans fats, oils, or whole milk. Croissants. Sweet rolls. Donuts. High-fat crackers, such as cheese crackers. Meats and other proteins Fatty meats, such as hot dogs, ribs, sausage, bacon, rib-eye roast or steak. High-fat deli meats, such as salami and bologna. Caviar. Domestic duck and goose. Organ meats, such as liver. Dairy Cream, sour cream, cream cheese, and creamed cottage cheese. Whole milk cheeses. Whole or 2% milk (liquid, evaporated, or condensed). Whole buttermilk. Cream sauce or high-fat cheese sauce. Whole-milk yogurt. Fats and oils Meat fat, or shortening. Cocoa butter, hydrogenated oils, palm oil, coconut oil, palm kernel oil. Solid fats and shortenings, including bacon fat, salt pork, lard, and butter. Nondairy cream substitutes. Salad dressings with cheese or sour cream. Beverages Regular sodas and any drinks with added sugar. Sweets and desserts Frosting. Pudding. Cookies. Cakes. Pies. Milk chocolate or white chocolate. Buttered syrups. Full-fat ice cream or ice cream drinks. The items listed above may not be a complete list of foods and beverages to avoid.  Contact a dietitian for more information. Summary  Heart-healthy meal planning includes limiting unhealthy fats, increasing healthy fats, and making other diet and lifestyle changes.  Lose weight if you are overweight. Losing just 5-10% of your body weight can help your overall health and prevent diseases such as diabetes and heart disease.  Focus on eating a balance of foods, including fruits and vegetables, low-fat or nonfat dairy, lean protein, nuts and  legumes, whole grains, and heart-healthy oils and fats. This information is not intended to replace advice given to you by your health care provider. Make sure you discuss any questions you have with your health care provider. Document Released: 07/24/2008 Document Revised: 11/22/2017 Document Reviewed: 11/22/2017 Elsevier Interactive Patient Education  2019 [employer].

## 2019-04-02 NOTE — Assessment & Plan Note (Signed)
Elevated weight Encourage lifestyle diet, exercise

## 2019-04-02 NOTE — Assessment & Plan Note (Signed)
Elevated Bp consistently new diagnosis HTN confirmed - Home BP readings elevated >SBP 140-150 avg  No known complications     Plan:  1. START new medicine ARB - Losartan 50mg  daily 2. Encourage improved lifestyle - low sodium diet, regular exercise 3. Continue monitor BP outside office, bring readings to next visit, if persistently >140/90 or new symptoms notify office sooner 4. Follow-up 3 months labs chemistry for K and Cr on new ARB and BP check

## 2019-04-02 NOTE — Assessment & Plan Note (Signed)
Uncontrolled cholesterol >150 LDL, poor lifestyle Last lipid panel 12/2018  Plan: 1. Recommended Statin cholesterol medicine but he declined, discussed ASCVD risk 2. Encourage improved lifestyle - low carb/cholesterol, reduce portion size, continue improving regular exercise

## 2019-04-02 NOTE — Assessment & Plan Note (Signed)
Mild elevated A1c to 6.2 Concern with obesity, HTN, HLD  Plan:  1. Not on any therapy currently  2. Encourage improved lifestyle - low carb, low sugar diet, reduce portion size, continue improving regular exercise 3. Follow-up 3 months check A1c

## 2019-04-02 NOTE — Assessment & Plan Note (Signed)
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

## 2019-04-02 NOTE — Progress Notes (Signed)
Subjective:    Patient ID: Jesse Gardner, male    DOB: 07/20/1946, 73 y.o.   MRN: [id number]  Jesse Gardner is a 73 y.o. male presenting on 04/02/2019 for Annual Exam (elevated bp)   HPI   Here for Annual Physical and Lab Review  Pre-Diabetes Last lab A1c 6.2, no prior comparison. Meds: None Lifestyle: - Diet (not following any particular diet, admits not limiting starch or sugar)  - Exercise (limited due to knee arthritis) Denies hypoglycemia, polyuria, visual changes, numbness or tingling.  Essential Hypertension / Obesity BMI >30 Reports history of mixed BP, has had some elevated readings, but never on anti HTN medication. He has a wrist cuff at home, checks Bp occasionally, seems variable readings >140-150 often Current Meds - None   Lifestyle: - Diet: Drinks up to pot of coffee daily, drinks tea. Not drinking soft drinks. - Exercise: No longer going to the gym. Limited exercise  HYPERLIPIDEMIA: - Last lipid panel 12/2018 elevated LDL Not on cholesterol medicine   PMH Bilateral Knee Pain, Osteoarthritis  Centrilobular Emphyseoma (COPD) /  Tobacco Abuse Active smoker >65 years, 0.5 to 1ppd. Last imaging done CT chest screen lung cancer diagnosed emphysema in 10/2018. He has declined maintenance therapy inhalers. - He admits some slight inc work of breathing at times usually only when "stuffy or hot out"   Health Maintenance:  Colon CA Screening: Last Colonoscopy few years ago, results were normal. Currently asymptomatic. He does the FOBT test sent by insurance yearly. He declines Cologuard or Colonoscopy today.  Prostate CA Screening: Prior PSA / DRE reported normal. Last PSA 1.8 (02/2019). Currently asymptomatic. No known family history of prostate CA.   Depression screen PHQ 2/9 10/24/2018  Decreased Interest 0  Down, Depressed, Hopeless 0  PHQ - 2 Score 0    No past medical history on file. Past Surgical History:  Procedure Laterality Date  . APPENDECTOMY     . HERNIA REPAIR     Social History   Socioeconomic History  . Marital status: Widowed    Spouse name: Not on file  . Number of children: Not on file  . Years of education: Not on file  . Highest education level: Not on file  Occupational History  . Not on file  Social Needs  . Financial resource strain: Not on file  . Food insecurity:    Worry: Not on file    Inability: Not on file  . Transportation needs:    Medical: Not on file    Non-medical: Not on file  Tobacco Use  . Smoking status: Current Every Day Smoker    Packs/day: 0.50    Years: 57.00    Pack years: 28.50    Types: Cigarettes  . Smokeless tobacco: Never Used  Substance and Sexual Activity  . Alcohol use: Yes    Alcohol/week: 1.0 standard drinks    Types: 1 Glasses of wine per week  . Drug use: Never  . Sexual activity: Not on file  Lifestyle  . Physical activity:    Days per week: Not on file    Minutes per session: Not on file  . Stress: Not on file  Relationships  . Social connections:    Talks on phone: Not on file    Gets together: Not on file    Attends religious service: Not on file    Active member of club or organization: Not on file    Attends meetings of clubs or organizations: Not on file  Relationship status: Not on file  . Intimate partner violence:    Fear of current or ex partner: Not on file    Emotionally abused: Not on file    Physically abused: Not on file    Forced sexual activity: Not on file  Other Topics Concern  . Not on file  Social History Narrative  . Not on file   Family History  Problem Relation Age of Onset  . Alzheimer's disease Mother   . Prostate cancer Brother    Current Outpatient Medications on File Prior to Visit  Medication Sig  . fluticasone (FLONASE) 50 MCG/ACT nasal spray Place 2 sprays into both nostrils daily. Use for 4-6 weeks then stop and use seasonally or as needed.  . Misc Natural Products (OSTEO BI-FLEX TRIPLE STRENGTH PO) Take by mouth.   . naproxen sodium (ALEVE) 220 MG tablet Take 440 mg by mouth daily as needed.   No current facility-administered medications on file prior to visit.     Review of Systems  Constitutional: Negative for activity change, appetite change, chills, diaphoresis, fatigue and fever.  HENT: Negative for congestion and hearing loss.   Eyes: Negative for visual disturbance.  Respiratory: Positive for cough. Negative for apnea, choking, chest tightness, shortness of breath and wheezing.   Cardiovascular: Negative for chest pain, palpitations and leg swelling.  Gastrointestinal: Negative for abdominal pain, anal bleeding, blood in stool, constipation, diarrhea, nausea and vomiting.  Endocrine: Negative for cold intolerance.  Genitourinary: Negative for dysuria, frequency and hematuria.  Musculoskeletal: Negative for arthralgias, back pain and neck pain.  Skin: Negative for rash.  Allergic/Immunologic: Negative for environmental allergies.  Neurological: Negative for dizziness, weakness, light-headedness, numbness and headaches.  Hematological: Negative for adenopathy.  Psychiatric/Behavioral: Negative for behavioral problems, dysphoric mood and sleep disturbance. The patient is not nervous/anxious.    Per HPI unless specifically indicated above      Objective:    BP (!) 161/90 (BP Location: Left Arm, Patient Position: Sitting, Cuff Size: Normal)   Pulse 99   Temp 98.1 F (36.7 C) (Oral)   Ht  (1.93 m)   Wt 250 lb 12.8 oz (113.8 kg)   BMI 30.53 kg/m   Wt Readings from Last 3 Encounters:  04/02/19 250 lb 12.8 oz (113.8 kg)  11/06/18 252 lb (114.3 kg)  10/24/18 252 lb (114.3 kg)    Physical Exam Vitals signs and nursing note reviewed.  Constitutional:      General: He is not in acute distress.    Appearance: He is well-developed. He is not diaphoretic.     Comments: Well-appearing, comfortable, cooperative, obese  HENT:     Head: Normocephalic and atraumatic.  Eyes:     General:         Right eye: No discharge.        Left eye: No discharge.     Conjunctiva/sclera: Conjunctivae normal.     Pupils: Pupils are equal, round, and reactive to light.  Neck:     Musculoskeletal: Normal range of motion and neck supple.     Thyroid: No thyromegaly.  Cardiovascular:     Rate and Rhythm: Normal rate and regular rhythm.     Heart sounds: Normal heart sounds. No murmur.  Pulmonary:     Effort: Pulmonary effort is normal. No respiratory distress.     Breath sounds: No wheezing, rhonchi or rales.     Comments: Reduced air movement diffuse just mild. Some slight increased work of breathing at baseline. Abdominal:  General: Bowel sounds are normal. There is no distension.     Palpations: Abdomen is soft. There is no mass.     Tenderness: There is no abdominal tenderness.  Musculoskeletal: Normal range of motion.        General: No tenderness.     Comments: Upper / Lower Extremities: - Normal muscle tone, strength bilateral upper extremities 5/5, lower extremities 5/5  Lymphadenopathy:     Cervical: No cervical adenopathy.  Skin:    General: Skin is warm and dry.     Findings: No erythema or rash.  Neurological:     Mental Status: He is alert and oriented to person, place, and time.     Comments: Distal sensation intact to light touch all extremities  Psychiatric:        Behavior: Behavior normal.     Comments: Well groomed, good eye contact, normal speech and thoughts       I have personally reviewed the radiology report from 11/06/18 on LDCT Lung.  CLINICAL DATA:  73 year old male current smoker with 104 pack-year history of smoking. Lung cancer screening examination.  EXAM: CT CHEST WITHOUT CONTRAST LOW-DOSE FOR LUNG CANCER SCREENING  TECHNIQUE: Multidetector CT imaging of the chest was performed following the standard protocol without IV contrast.  COMPARISON:  No priors.  FINDINGS: Cardiovascular: Heart size is normal. There is no significant  pericardial fluid, thickening or pericardial calcification. There is aortic atherosclerosis, as well as atherosclerosis of the great vessels of the mediastinum and the coronary arteries, including calcified atherosclerotic plaque in the left main, left anterior descending, left circumflex and right coronary arteries. Calcifications of the aortic valve.  Mediastinum/Nodes: No pathologically enlarged mediastinal or hilar lymph nodes. Please note that accurate exclusion of hilar adenopathy is limited on noncontrast CT scans. Esophagus is unremarkable in appearance. No axillary lymphadenopathy.  Lungs/Pleura: Multiple small pulmonary nodules are scattered throughout both lungs, largest of which is in the right middle lobe abutting the minor fissure (axial image 201 of series 3), with a volume derived mean diameter 5.4 mm. No larger more suspicious appearing pulmonary nodules or masses are noted. No acute consolidative airspace disease. No pleural effusions. Mild diffuse bronchial wall thickening with mild very centrilobular and paraseptal emphysema.  Upper Abdomen: Aortic atherosclerosis. Incompletely imaged low-attenuation lesion measuring at least 6.1 cm extending from the anterior aspect of the interpolar region of the right kidney, not characterized on today's noncontrast CT examination, but statistically likely a large cyst.  Musculoskeletal: There are no aggressive appearing lytic or blastic lesions noted in the visualized portions of the skeleton.  IMPRESSION: 1. Lung-RADS 2S, benign appearance or behavior. Continue annual screening with low-dose chest CT without contrast in 12 months. 2. The "S" modifier above refers to potentially clinically significant non lung cancer related findings. Specifically, there is aortic atherosclerosis, in addition to left main and 3 vessel coronary artery disease. Assessment for potential risk factor modification, dietary therapy or  pharmacologic therapy may be warranted, if clinically indicated. 3. Mild diffuse bronchial wall thickening with very mild centrilobular and paraseptal emphysema; imaging findings suggestive of underlying COPD.  Aortic Atherosclerosis (ICD10-I70.0) and Emphysema (ICD10-J43.9).   Electronically Signed   By: Trudie Reed M.D.   On: 11/07/2018 08:31  Results for orders placed or performed in visit on 01/19/19  Hepatitis C antibody  Result Value Ref Range   Hepatitis C Ab NON-REACTIVE NON-REACTI   SIGNAL TO CUT-OFF 0.04 <1.00  PSA  Result Value Ref Range   PSA 1.8 <  OR = 4.0 ng/mL  Lipid panel  Result Value Ref Range   Cholesterol 220 (H) <200 mg/dL   HDL 43 > OR = 40 mg/dL   Triglycerides 161 <096 mg/dL   LDL Cholesterol (Calc) 153 (H) mg/dL (calc)   Total CHOL/HDL Ratio 5.1 (H) <5.0 (calc)   Non-HDL Cholesterol (Calc) 177 (H) <130 mg/dL (calc)  COMPLETE METABOLIC PANEL WITH GFR  Result Value Ref Range   Glucose, Bld 101 (H) 65 - 99 mg/dL   BUN 21 7 - 25 mg/dL   Creat 0.45 4.09 - 8.11 mg/dL   GFR, Est Non African American 93 > OR = 60 mL/min/1.70m2   GFR, Est African American 108 > OR = 60 mL/min/1.82m2   BUN/Creatinine Ratio NOT APPLICABLE 6 - 22 (calc)   Sodium 141 135 - 146 mmol/L   Potassium 4.2 3.5 - 5.3 mmol/L   Chloride 105 98 - 110 mmol/L   CO2 28 20 - 32 mmol/L   Calcium 9.6 8.6 - 10.3 mg/dL   Total Protein 7.4 6.1 - 8.1 g/dL   Albumin 4.6 3.6 - 5.1 g/dL   Globulin 2.8 1.9 - 3.7 g/dL (calc)   AG Ratio 1.6 1.0 - 2.5 (calc)   Total Bilirubin 0.5 0.2 - 1.2 mg/dL   Alkaline phosphatase (APISO) 58 35 - 144 U/L   AST 23 10 - 35 U/L   ALT 23 9 - 46 U/L  CBC with Differential/Platelet  Result Value Ref Range   WBC 11.0 (H) 3.8 - 10.8 Thousand/uL   RBC 5.14 4.20 - 5.80 Million/uL   Hemoglobin 15.1 13.2 - 17.1 g/dL   HCT 91.4 78.2 - 95.6 %   MCV 85.2 80.0 - 100.0 fL   MCH 29.4 27.0 - 33.0 pg   MCHC 34.5 32.0 - 36.0 g/dL   RDW 21.3 08.6 - 57.8 %    Platelets 212 140 - 400 Thousand/uL   MPV 12.1 7.5 - 12.5 fL   Neutro Abs 6,303 1,500 - 7,800 cells/uL   Lymphs Abs 3,234 850 - 3,900 cells/uL   Absolute Monocytes 1,111 (H) 200 - 950 cells/uL   Eosinophils Absolute 308 15 - 500 cells/uL   Basophils Absolute 44 0 - 200 cells/uL   Neutrophils Relative % 57.3 %   Total Lymphocyte 29.4 %   Monocytes Relative 10.1 %   Eosinophils Relative 2.8 %   Basophils Relative 0.4 %  Hemoglobin A1c  Result Value Ref Range   Hgb A1c MFr Bld 6.2 (H) <5.7 % of total Hgb   Mean Plasma Glucose 131 (calc)   eAG (mmol/L) 7.3 (calc)      Assessment & Plan:   Problem List Items Addressed This Visit    Centrilobular emphysema (HCC)    Chronic COPD emphysema, 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 START new medicine ARB - Losartan  daily 2. Encourage improved lifestyle - low sodium diet, regular exercise 3. Continue monitor BP outside office, bring readings to next visit, if persistently >140/90 or new symptoms notify office sooner 4. Follow-up 3 months labs chemistry for K and Cr on new ARB and BP  check       Relevant Medications   losartan (COZAAR) 50 MG tablet   Other Relevant Orders   BASIC METABOLIC PANEL WITH GFR   Obesity (BMI 30.0-34.9)    Elevated weight Encourage lifestyle diet, exercise      Pre-diabetes    Mild elevated A1c to 6.2 Concern with obesity, HTN, HLD  Plan:  1. Not on any therapy currently  2. Encourage improved lifestyle - low carb, low sugar diet, reduce portion size, continue improving regular exercise 3. Follow-up 3 months check A1c         Relevant Orders   Hemoglobin A1c   Pure hypercholesterolemia    Uncontrolled cholesterol >150 LDL, poor lifestyle Last lipid panel 12/2018  Plan: 1. Recommended Statin cholesterol medicine but he declined, discussed ASCVD risk 2. Encourage improved lifestyle - low carb/cholesterol, reduce portion size, continue improving regular exercise      Relevant Medications   losartan (COZAAR) 50 MG tablet    Other Visit Diagnoses    Annual physical exam    -  Primary      Updated Health Maintenance information - Offered Cologuard, he declined, reconsider Reviewed recent lab results with patient Encouraged improvement to lifestyle with diet and exercise   Meds ordered this encounter  Medications  . losartan (COZAAR) 50 MG tablet    Sig: Take 1 tablet (50 mg total) by mouth daily.    Dispense:  30 tablet    Refill:  3      Follow up plan: Return in about 3 months (around 07/03/2019) for HTN, PreDM result.  Saralyn Pilar, DO [facility] 04/02/2019, 9:14 AM

## 2019-06-29 ENCOUNTER — Other Ambulatory Visit: Payer: Medicare Other

## 2019-06-29 ENCOUNTER — Other Ambulatory Visit: Payer: Self-pay

## 2019-06-29 DIAGNOSIS — I10 Essential (primary) hypertension: Secondary | ICD-10-CM

## 2019-06-29 DIAGNOSIS — R7303 Prediabetes: Secondary | ICD-10-CM | POA: Diagnosis not present

## 2019-06-30 ENCOUNTER — Telehealth: Payer: Self-pay | Admitting: Family Medicine

## 2019-06-30 LAB — BASIC METABOLIC PANEL WITH GFR
BUN: 19 mg/dL (ref 7–25)
CO2: 30 mmol/L (ref 20–32)
Calcium: 9.2 mg/dL (ref 8.6–10.3)
Chloride: 104 mmol/L (ref 98–110)
Creat: 0.82 mg/dL (ref 0.70–1.18)
GFR, Est African American: 102 mL/min/{1.73_m2} (ref >=60)
GFR, Est Non African American: 88 mL/min/{1.73_m2} (ref >=60)
Glucose, Bld: 106 mg/dL — ABNORMAL HIGH (ref 65–99)
Potassium: 4.4 mmol/L (ref 3.5–5.3)
Sodium: 141 mmol/L (ref 135–146)

## 2019-06-30 LAB — HEMOGLOBIN A1C
Hgb A1c MFr Bld: 6 % of total Hgb — ABNORMAL HIGH (ref <5.7)
Mean Plasma Glucose: 126 (calc)
eAG (mmol/L): 7 (calc)

## 2019-06-30 NOTE — Telephone Encounter (Signed)
Telephone note created in error.  Nobie Putnam, DO [facility] 06/30/2019, 12:57 PM

## 2019-07-03 ENCOUNTER — Ambulatory Visit (INDEPENDENT_AMBULATORY_CARE_PROVIDER_SITE_OTHER): Payer: Medicare Other | Admitting: Family Medicine

## 2019-07-03 ENCOUNTER — Encounter: Payer: Self-pay | Admitting: Family Medicine

## 2019-07-03 ENCOUNTER — Other Ambulatory Visit: Payer: Self-pay

## 2019-07-03 VITALS — BP 138/80 | HR 105 | Temp 98.4°F | Resp 16 | Ht 76.0 in | Wt 251.0 lb

## 2019-07-03 DIAGNOSIS — Z23 Encounter for immunization: Secondary | ICD-10-CM | POA: Diagnosis not present

## 2019-07-03 DIAGNOSIS — I10 Essential (primary) hypertension: Secondary | ICD-10-CM | POA: Diagnosis not present

## 2019-07-03 DIAGNOSIS — R7303 Prediabetes: Secondary | ICD-10-CM

## 2019-07-03 DIAGNOSIS — M25561 Pain in right knee: Secondary | ICD-10-CM

## 2019-07-03 DIAGNOSIS — M170 Bilateral primary osteoarthritis of knee: Secondary | ICD-10-CM | POA: Diagnosis not present

## 2019-07-03 DIAGNOSIS — G8929 Other chronic pain: Secondary | ICD-10-CM

## 2019-07-03 DIAGNOSIS — M25562 Pain in left knee: Secondary | ICD-10-CM

## 2019-07-03 NOTE — Patient Instructions (Addendum)
Thank you for coming to the office today.  Recent Labs    01/19/19 0808 06/29/19 0802  HGBA1C 6.2* 6.0*   Sugar is improved, keep up the good work  Flu shot today  If you need me sooner for the knee pain, we can consider medication and or injection of cortisone, or referral to Orthopedic - let me knkow   Please schedule a Follow-up Appointment to: Return in about 6 months (around 12/31/2019) for 6 month follow-up HTN, PreDM A1c, Smoking, Knee Pain.  If you have any other questions or concerns, please feel free to call the office or send a message through [facility]. You may also schedule an earlier appointment if necessary.  Additionally, you may be receiving a survey about your experience at our office within a few days to 1 week by e-mail or mail. We value your feedback.  Nobie Putnam, DO [facility]

## 2019-07-03 NOTE — Assessment & Plan Note (Signed)
Secondary to underlying OA/DJD See A&P

## 2019-07-03 NOTE — Assessment & Plan Note (Signed)
Improved BP, mild elevated initial reading. - Home BP readings improved avg  No known complications     Plan:  1. Continue current med - Losartan 50mg  daily 2. Encourage improved lifestyle - low sodium diet, regular exercise 3. Continue monitor BP outside office, bring readings to next visit, if persistently >140/90 or new symptoms notify office sooner 4. Follow-up 6 mo

## 2019-07-03 NOTE — Assessment & Plan Note (Signed)
Subacute on chronic R>L generalized vs upper lateral aspect Knee pain and swelling without known injury or trauma  Confirmed bilateral OA/DJD knees on last X-ray 09/2018 - Able to bear weight, possible rare knee instability but no locking - No prior history of knee surgery, arthroscopy  Plan: 1. Bilateral knee standing AP x-ray today w/ additional views of R knee - reviewed results, confirmed OA/DJD, see results - patient notified, continue current plan - Offered options - rx NSAID oral temporary use, rx topical NSAID, Muscle relaxant, future knee injection cortisone or referral to PT vs Ortho - he declines at this time. - Continue w/ NSAID OTC Aleve - Can still use Tylenol 500-1000mg  per dose TID PRN breakthrough - RICE therapy (rest, ice, compression, elevation) for swelling, activity modification  F/u PRN

## 2019-07-03 NOTE — Assessment & Plan Note (Signed)
Improved A1c from 6.2 down to 6.0 Concern with obesity, HTN, HLD  Plan:  1. Not on any therapy currently  2. Encourage improved lifestyle - low carb, low sugar diet, reduce portion size, continue improving regular exercise  F/u A1c q 6 mo

## 2019-07-03 NOTE — Progress Notes (Signed)
Subjective:    Patient ID: Jesse Gardner, male    DOB: 19-Feb-1946, 73 y.o.   MRN: [id number]  Jesse Gardner is a 73 y.o. male presenting on 07/03/2019 for Hypertension and Pre-Diabetes   HPI   Pre-Diabetes Improved A1c from 6.2 down to 6.0 on last lab. He has improved diet. Meds: None He is on ARB Lifestyle: - Diet (Reduced sugar added to tea and coffee, he has reduced desserts) - Exercise (limited due to knee arthritis) Denies hypoglycemia, polyuria, visual changes, numbness or tingling.  Essential Hypertension / Obesity BMI >30 Previously started on Losartan in 03/2019, avg 112/60 - 133/80 Last lab was normal since starting ARB Current Meds -Losartan 50mg  daily Lifestyle: - Diet:Drinks up to pot of coffee daily, drinks tea. Not drinking soft drinks. - Exercise:No longer going to the gym. Limited exercise Denies CP, dyspnea, HA, edema, dizziness / lightheadedness  Bilateral Knee Pain, Osteoarthritis Chronic problem. R>L. Known OA/DJD Worse aching after increased activity Has taken OTC Aleve, Tylenol PRN He uses OTC [place] Dream cream, Osteobiflex Tried Knee wrap with limited results. Admits occasional leg swelling and knee swelling Denies injury or trauma, bruising, redness, other joint pain   PMH COPD, Tobacco abuse  Health Maintenance:  Due for Flu Shot, will receive today     Depression screen [facility] 2/9 07/03/2019 10/24/2018  Decreased Interest 0 0  Down, Depressed, Hopeless 0 0  PHQ - 2 Score 0 0    Social History   Tobacco Use  . Smoking status: Current Every Day Smoker    Packs/day: 0.50    Years: 57.00    Pack years: 28.50    Types: Cigarettes  . Smokeless tobacco: Never Used  Substance Use Topics  . Alcohol use: Yes    Alcohol/week: 1.0 standard drinks    Types: 1 Glasses of wine per week  . Drug use: Never    Review of Systems Per HPI unless specifically indicated above     Objective:    BP 138/80 (BP Location: Left Arm, Cuff  Size: Normal)   Pulse (!) 105   Temp 98.4 F (36.9 C) (Oral)   Resp 16   Ht 6\' 4"  (1.93 m)   Wt 251 lb (113.9 kg)   SpO2 93%   BMI 30.55 kg/m   Wt Readings from Last 3 Encounters:  07/03/19 251 lb (113.9 kg)  04/02/19 250 lb 12.8 oz (113.8 kg)  11/06/18 252 lb (114.3 kg)    Physical Exam Vitals signs and nursing note reviewed.  Constitutional:      General: He is not in acute distress.    Appearance: He is well-developed. He is not diaphoretic.     Comments: Well-appearing, comfortable, cooperative, obese  HENT:     Head: Normocephalic and atraumatic.  Eyes:     General:        Right eye: No discharge.        Left eye: No discharge.     Conjunctiva/sclera: Conjunctivae normal.     Pupils: Pupils are equal, round, and reactive to light.  Neck:     Musculoskeletal: Normal range of motion and neck supple.     Thyroid: No thyromegaly.  Cardiovascular:     Rate and Rhythm: Normal rate and regular rhythm.     Heart sounds: Normal heart sounds. No murmur.  Pulmonary:     Effort: Pulmonary effort is normal. No respiratory distress.     Breath sounds: No wheezing, rhonchi or rales.     Comments:  Reduced air movement diffuse just mild. Some slight increased work of breathing at baseline. Abdominal:     General: Bowel sounds are normal. There is no distension.     Palpations: Abdomen is soft. There is no mass.     Tenderness: There is no abdominal tenderness.  Musculoskeletal: Normal range of motion.        General: No tenderness.     Comments: Upper / Lower Extremities: - Normal muscle tone, strength bilateral upper extremities 5/5, lower extremities 5/5  Right Knee Inspection: Mild bulky appearance. No ecchymosis or effusion. Palpation: Mild +TTP R knee superior lateral aspect over muscle tendon. Significant fine crepitus ROM: Full active ROM bilaterally Special Testing: Lachman / Valgus/Varus tests negative with intact ligaments (ACL, MCL, LCL). Strength: 5/5 intact knee  flex/ext, ankle dorsi/plantarflex Neurovascular: distally intact sensation light touch and pulses   Lymphadenopathy:     Cervical: No cervical adenopathy.  Skin:    General: Skin is warm and dry.     Findings: No erythema or rash.  Neurological:     Mental Status: He is alert and oriented to person, place, and time.     Comments: Distal sensation intact to light touch all extremities  Psychiatric:        Behavior: Behavior normal.     Comments: Well groomed, good eye contact, normal speech and thoughts      Results for orders placed or performed in visit on 06/29/19  Hemoglobin A1c  Result Value Ref Range   Hgb A1c MFr Bld 6.0 (H) <5.7 % of total Hgb   Mean Plasma Glucose 126 (calc)   eAG (mmol/L) 7.0 (calc)  BASIC METABOLIC PANEL WITH GFR  Result Value Ref Range   Glucose, Bld 106 (H) 65 - 99 mg/dL   BUN 19 7 - 25 mg/dL   Creat 6.570.82 8.460.70 - 9.621.18 mg/dL   GFR, Est Non African American 88 > OR = 60 mL/min/1.773m2   GFR, Est African American 102 > OR = 60 mL/min/1.2173m2   BUN/Creatinine Ratio NOT APPLICABLE 6 - 22 (calc)   Sodium 141 135 - 146 mmol/L   Potassium 4.4 3.5 - 5.3 mmol/L   Chloride 104 98 - 110 mmol/L   CO2 30 20 - 32 mmol/L   Calcium 9.2 8.6 - 10.3 mg/dL      Assessment & Plan:   Problem List Items Addressed This Visit    Bilateral chronic knee pain    Secondary to underlying OA/DJD See A&P      Essential hypertension    Improved BP, mild elevated initial reading. - Home BP readings improved avg  No known complications     Plan:  1. Continue current med - Losartan 50mg  daily 2. Encourage improved lifestyle - low sodium diet, regular exercise 3. Continue monitor BP outside office, bring readings to next visit, if persistently >140/90 or new symptoms notify office sooner 4. Follow-up 6 mo      Osteoarthritis of knees, bilateral    Subacute on chronic R>L generalized vs upper lateral aspect Knee pain and swelling without known injury or trauma   Confirmed bilateral OA/DJD knees on last X-ray 09/2018 - Able to bear weight, possible rare knee instability but no locking - No prior history of knee surgery, arthroscopy  Plan: 1. Bilateral knee standing AP x-ray today w/ additional views of R knee - reviewed results, confirmed OA/DJD, see results - patient notified, continue current plan - Offered options - rx NSAID oral temporary use, rx topical  NSAID, Muscle relaxant, future knee injection cortisone or referral to PT vs Ortho - he declines at this time. - Continue w/ NSAID OTC Aleve - Can still use Tylenol 500-1000mg  per dose TID PRN breakthrough - RICE therapy (rest, ice, compression, elevation) for swelling, activity modification  F/u PRN      Pre-diabetes - Primary    Improved A1c from 6.2 down to 6.0 Concern with obesity, HTN, HLD  Plan:  1. Not on any therapy currently  2. Encourage improved lifestyle - low carb, low sugar diet, reduce portion size, continue improving regular exercise  F/u A1c q 6 mo        Other Visit Diagnoses    Needs flu shot       Relevant Orders   Flu Vaccine QUAD High Dose(Fluad) (Completed)      No orders of the defined types were placed in this encounter.   Follow up plan: Return in about 6 months (around 12/31/2019) for 6 month follow-up HTN, PreDM A1c, Smoking, Knee Pain.   Saralyn Pilar, DO [facility] 07/03/2019, 9:42 AM

## 2019-07-16 ENCOUNTER — Other Ambulatory Visit: Payer: Self-pay | Admitting: Family Medicine

## 2019-07-16 DIAGNOSIS — I10 Essential (primary) hypertension: Secondary | ICD-10-CM

## 2019-09-14 LAB — FECAL OCCULT BLOOD, IMMUNOCHEMICAL: IFOBT: NEGATIVE

## 2019-09-22 ENCOUNTER — Ambulatory Visit (INDEPENDENT_AMBULATORY_CARE_PROVIDER_SITE_OTHER): Payer: Medicare Other

## 2019-09-22 ENCOUNTER — Other Ambulatory Visit: Payer: Self-pay

## 2019-09-22 VITALS — BP 141/81 | HR 93 | Ht 76.0 in | Wt 264.0 lb

## 2019-09-22 DIAGNOSIS — Z0000 Encounter for general adult medical examination without abnormal findings: Secondary | ICD-10-CM | POA: Diagnosis not present

## 2019-09-22 NOTE — Progress Notes (Signed)
Subjective:   Jesse Gardner is a 74 y.o. male who presents for an Initial Medicare Annual Wellness Visit.  Review of Systems   Cardiac Risk Factors include: advanced age (>66men, >11 women);hypertension;obesity (BMI >30kg/m2);smoking/ tobacco exposure    Objective:    Today's Vitals   09/22/19 0922  BP: (!) 141/81  Pulse: 93  Weight: 264 lb (119.7 kg)  Height: 6\' 4"  (1.93 m)   Body mass index is 32.14 kg/m.  Advanced Directives 09/22/2019  Does Patient Have a Medical Advance Directive? No  Would patient like information on creating a medical advance directive? No - Patient declined    Current Medications (verified) Outpatient Encounter Medications as of 09/22/2019  Medication Sig   fluticasone (FLONASE) 50 MCG/ACT nasal spray Place 2 sprays into both nostrils daily. Use for 4-6 weeks then stop and use seasonally or as needed.   losartan (COZAAR) 50 MG tablet TAKE 1 TABLET(50 MG) BY MOUTH DAILY   Misc Natural Products (OSTEO BI-FLEX TRIPLE STRENGTH PO) Take by mouth.   naproxen sodium (ALEVE) 220 MG tablet Take 440 mg by mouth daily as needed.   No facility-administered encounter medications on file as of 09/22/2019.     Allergies (verified) Patient has no known allergies.   History: Past Medical History:  Diagnosis Date   Hypertension    Past Surgical History:  Procedure Laterality Date   APPENDECTOMY     HERNIA REPAIR     Family History  Problem Relation Age of Onset   Alzheimer's disease Mother    Diabetes Father    Prostate cancer Brother    Social History   Socioeconomic History   Marital status: Significant Other    Spouse name: Not on file   Number of children: Not on file   Years of education: Not on file   Highest education level: Not on file  Occupational History   Not on file  Social Needs   Financial resource strain: Not hard at all   Food insecurity    Worry: Never true    Inability: Never true   Transportation  needs    Medical: No    Non-medical: No  Tobacco Use   Smoking status: Current Some Day Smoker    Packs/day: 0.25    Years: 57.00    Pack years: 14.25    Types: Cigarettes   Smokeless tobacco: Never Used   Tobacco comment: 1 cigarette in the last month   Substance and Sexual Activity   Alcohol use: Yes    Alcohol/week: 1.0 standard drinks    Types: 1 Glasses of wine per week   Drug use: Never   Sexual activity: Not on file  Lifestyle   Physical activity    Days per week: 0 days    Minutes per session: 0 min   Stress: To some extent  Relationships   Social connections    Talks on phone: Three times a week    Gets together: Three times a week    Attends religious service: Never    Active member of club or organization: No    Attends meetings of clubs or organizations: Never    Relationship status: Living with partner  Other Topics Concern   Not on file  Social History Narrative   Not on file   Tobacco Counseling Ready to quit: Yes Counseling given: Yes Comment: 1 cigarette in the last month    Clinical Intake:  Pre-visit preparation completed: Yes  Pain : No/denies pain  Nutritional Status: BMI > 30  Obese Nutritional Risks: None Diabetes: No  How often do you need to have someone help you when you read instructions, pamphlets, or other written materials from your doctor or pharmacy?: 1 - Never  Interpreter Needed?: No  Information entered by :: Jesse Kelsay,LPN  Activities of Daily Living In your present state of health, do you have any difficulty performing the following activities: 09/22/2019 07/03/2019  Hearing? Y N  Comment no hearing aids -  Vision? Y N  Comment eyeglasses, [facility] annually -  Difficulty concentrating or making decisions? Y N  Comment comes back -  Walking or climbing stairs? Y N  Comment knee pain -  Dressing or bathing? N N  Doing errands, shopping? N N  Preparing Food and eating ? N -  Using the Toilet?  N -  In the past six months, have you accidently leaked urine? N -  Do you have problems with loss of bowel control? N -  Managing your Medications? N -  Managing your Finances? N -  Housekeeping or managing your Housekeeping? N -     Immunizations and Health Maintenance Immunization History  Administered Date(s) Administered   Fluad Quad(high Dose 65+) 07/03/2019   Influenza, High Dose Seasonal PF 10/24/2018   Health Maintenance Due  Topic Date Due   COLONOSCOPY  05/02/1996    Patient Care Team: Jesse Cords, DO as PCP - General (Family Medicine)  Indicate any recent Medical Services you may have received from other than [facility] providers in the past year (date may be approximate).    Assessment:   This is a routine wellness examination for Jesse Gardner.  Hearing/Vision screen No exam data present  Dietary issues and exercise activities discussed: Current Exercise Habits: The patient does not participate in regular exercise at present, Exercise limited by: None identified  Goals     Quit Smoking      Depression Screen PHQ 2/9 Scores 09/22/2019 07/03/2019 10/24/2018  PHQ - 2 Score 0 0 0    Fall Risk Fall Risk  09/22/2019 07/03/2019 10/24/2018  Falls in the past year? 0 0 0  Number falls in past yr: 0 - -  Injury with Fall? 0 - -  Follow up - Falls evaluation completed Falls evaluation completed    FALL RISK PREVENTION PERTAINING TO THE HOME:  Any stairs in or around the home? Yes  If so, are there any without handrails? No   Home free of loose throw rugs in walkways, pet beds, electrical Gardner, etc? Yes  Adequate lighting in your home to reduce risk of falls? Yes   ASSISTIVE DEVICES UTILIZED TO PREVENT FALLS:  Life alert? No  Use of a cane, walker or w/c? No  Grab bars in the bathroom? No  Shower chair or bench in shower? No  Elevated toilet seat or a handicapped toilet? No    TIMED UP AND GO:  Was the test performed? Yes .  Length of time to  ambulate 10 feet: 9 sec.   GAIT:  Appearance of gait: Gait steady and fast *without the use of an assistive device.  Education: Fall risk prevention has been discussed.  Intervention(s) required? No   DME/home health order needed?  No    Cognitive Function:     6CIT Screen 09/22/2019  What Year? 0 points  What month? 0 points  What time? 0 points  Count back from 20 0 points  Months in reverse 0 points  Repeat  phrase 0 points  Total Score 0    Screening Tests Health Maintenance  Topic Date Due   COLONOSCOPY  05/02/1996   TETANUS/TDAP  09/21/2020 (Originally 05/02/1965)   PNA vac Low Risk Adult (1 of 2 - PCV13) 09/21/2020 (Originally 05/03/2011)   INFLUENZA VACCINE  Completed   Hepatitis C Screening  Completed    Qualifies for Shingles Vaccine? Yes  Zostavax completed n/a. Due for Shingrix. Education has been provided regarding the importance of this vaccine. Pt has been advised to call insurance company to determine out of pocket expense. Advised may also receive vaccine at local pharmacy or Health Dept. Verbalized acceptance and understanding.  Tdap: Although this vaccine is not a covered service during a Wellness Exam, does the patient still wish to receive this vaccine today?  No .  Education has been provided regarding the importance of this vaccine. Advised may receive this vaccine at local pharmacy or Health Dept. Aware to provide a copy of the vaccination record if obtained from local pharmacy or Health Dept. Verbalized acceptance and understanding.  Flu Vaccine: up to date   Pneumococcal Vaccine: Due for Pneumococcal vaccine. Does the patient want to receive this vaccine today?  No . Education has been provided regarding the importance of this vaccine but still declined. Advised may receive this vaccine at local pharmacy or Health Dept. Aware to provide a copy of the vaccination record if obtained from local pharmacy or Health Dept. Verbalized acceptance and  understanding.   Cancer Screenings:  Colorectal Screening: Completed colonoscopy in [place] in the last 5 years, it was normal.  Completed fecal occult testing this past week through insurance.   Lung Cancer Screening: (Low Dose CT Chest recommended if Age 30-80 years, 30 pack-year currently smoking OR have quit w/in 15years.) does qualify.  Completed 10/2018    Additional Screening:  Hepatitis C Screening: does qualify; Completed 01/19/2019  Vision Screening: Recommended annual ophthalmology exams for early detection of glaucoma and other disorders of the eye. Is the patient up to date with their annual eye exam?  Yes  Who is the provider or what is the name of the office in which the pt attends annual eye exams? [facility]   .  Dental Screening: Recommended annual dental exams for proper oral hygiene  Community Resource Referral:  CRR required this visit?  No        Plan:  I have personally reviewed and addressed the Medicare Annual Wellness questionnaire and have noted the following in the patients chart:  A. Medical and social history B. Use of alcohol, tobacco or illicit drugs  C. Current medications and supplements D. Functional ability and status E.  Nutritional status F.  Physical activity G. Advance directives H. List of other physicians I.  Hospitalizations, surgeries, and ER visits in previous 12 months J.  Vitals K. Screenings such as hearing and vision if needed, cognitive and depression L. Referrals and appointments   In addition, I have reviewed and discussed with patient certain preventive protocols, quality metrics, and best practice recommendations. A written personalized care plan for preventive services as well as general preventive health recommendations were provided to patient.   Signed,    Collene SchlichterHill, Meriah Shands A, LPN   [id number]/2020  Nurse Health Advisor   Nurse Notes: none

## 2019-09-22 NOTE — Patient Instructions (Signed)
Jesse Gardner , Thank you for taking time to come for your Medicare Wellness Visit. I appreciate your ongoing commitment to your health goals. Please review the following plan we discussed and let me know if I can assist you in the future.   Screening recommendations/referrals: Colonoscopy: up to date  Recommended yearly ophthalmology/optometry visit for glaucoma screening and checkup Recommended yearly dental visit for hygiene and checkup  Vaccinations: Influenza vaccine: up to date Pneumococcal vaccine: declined  Tdap vaccine: due now  Shingles vaccine: shingrix eligible     Advanced directives: Advance directive discussed with you today. Even though you declined this today please call our office should you change your mind and we can give you the proper paperwork for you to fill out.  Conditions/risks identified: smoking cessation discussed. Discussed lung cancer screening - due 10/2019  Next appointment: Follow up in one year for your annual wellness visit   Preventive Care 65 Years and Older, Male Preventive care refers to lifestyle choices and visits with your health care provider that can promote health and wellness. What does preventive care include?  A yearly physical exam. This is also called an annual well check.  Dental exams once or twice a year.  Routine eye exams. Ask your health care provider how often you should have your eyes checked.  Personal lifestyle choices, including:  Daily care of your teeth and gums.  Regular physical activity.  Eating a healthy diet.  Avoiding tobacco and drug use.  Limiting alcohol use.  Practicing safe sex.  Taking low doses of aspirin every day.  Taking vitamin and mineral supplements as recommended by your health care provider. What happens during an annual well check? The services and screenings done by your health care provider during your annual well check will depend on your age, overall health, lifestyle risk factors,  and family history of disease. Counseling  Your health care provider may ask you questions about your:  Alcohol use.  Tobacco use.  Drug use.  Emotional well-being.  Home and relationship well-being.  Sexual activity.  Eating habits.  History of falls.  Memory and ability to understand (cognition).  Work and work Statistician. Screening  You may have the following tests or measurements:  Height, weight, and BMI.  Blood pressure.  Lipid and cholesterol levels. These may be checked every 5 years, or more frequently if you are over 73 years old.  Skin check.  Lung cancer screening. You may have this screening every year starting at age 73 if you have a 30-pack-year history of smoking and currently smoke or have quit within the past 15 years.  Fecal occult blood test (FOBT) of the stool. You may have this test every year starting at age 73.  Flexible sigmoidoscopy or colonoscopy. You may have a sigmoidoscopy every 5 years or a colonoscopy every 10 years starting at age 73.  Prostate cancer screening. Recommendations will vary depending on your family history and other risks.  Hepatitis C blood test.  Hepatitis B blood test.  Sexually transmitted disease (STD) testing.  Diabetes screening. This is done by checking your blood sugar (glucose) after you have not eaten for a while (fasting). You may have this done every 1-3 years.  Abdominal aortic aneurysm (AAA) screening. You may need this if you are a current or former smoker.  Osteoporosis. You may be screened starting at age 73 if you are at high risk. Talk with your health care provider about your test results, treatment options, and if  necessary, the need for more tests. Vaccines  Your health care provider may recommend certain vaccines, such as:  Influenza vaccine. This is recommended every year.  Tetanus, diphtheria, and acellular pertussis (Tdap, Td) vaccine. You may need a Td booster every 10 years.   Zoster vaccine. You may need this after age 8.  Pneumococcal 13-valent conjugate (PCV13) vaccine. One dose is recommended after age 22.  Pneumococcal polysaccharide (PPSV23) vaccine. One dose is recommended after age 55. Talk to your health care provider about which screenings and vaccines you need and how often you need them. This information is not intended to replace advice given to you by your health care provider. Make sure you discuss any questions you have with your health care provider. Document Released: 11/11/2015 Document Revised: 07/04/2016 Document Reviewed: 08/16/2015 Elsevier Interactive Patient Education  2017 [place] Prevention in the Home Falls can cause injuries. They can happen to people of all ages. There are many things you can do to make your home safe and to help prevent falls. What can I do on the outside of my home?  Regularly fix the edges of walkways and driveways and fix any cracks.  Remove anything that might make you trip as you walk through a door, such as a raised step or threshold.  Trim any bushes or trees on the path to your home.  Use bright outdoor lighting.  Clear any walking paths of anything that might make someone trip, such as rocks or tools.  Regularly check to see if handrails are loose or broken. Make sure that both sides of any steps have handrails.  Any raised decks and porches should have guardrails on the edges.  Have any leaves, snow, or ice cleared regularly.  Use sand or salt on walking paths during winter.  Clean up any spills in your garage right away. This includes oil or grease spills. What can I do in the bathroom?  Use night lights.  Install grab bars by the toilet and in the tub and shower. Do not use towel bars as grab bars.  Use non-skid mats or decals in the tub or shower.  If you need to sit down in the shower, use a plastic, non-slip stool.  Keep the floor dry. Clean up any water that spills on  the floor as soon as it happens.  Remove soap buildup in the tub or shower regularly.  Attach bath mats securely with double-sided non-slip rug tape.  Do not have throw rugs and other things on the floor that can make you trip. What can I do in the bedroom?  Use night lights.  Make sure that you have a light by your bed that is easy to reach.  Do not use any sheets or blankets that are too big for your bed. They should not hang down onto the floor.  Have a firm chair that has side arms. You can use this for support while you get dressed.  Do not have throw rugs and other things on the floor that can make you trip. What can I do in the kitchen?  Clean up any spills right away.  Avoid walking on wet floors.  Keep items that you use a lot in easy-to-reach places.  If you need to reach something above you, use a strong step stool that has a grab bar.  Keep electrical cords out of the way.  Do not use floor polish or wax that makes floors slippery. If you must  use wax, use non-skid floor wax.  Do not have throw rugs and other things on the floor that can make you trip. What can I do with my stairs?  Do not leave any items on the stairs.  Make sure that there are handrails on both sides of the stairs and use them. Fix handrails that are broken or loose. Make sure that handrails are as long as the stairways.  Check any carpeting to make sure that it is firmly attached to the stairs. Fix any carpet that is loose or worn.  Avoid having throw rugs at the top or bottom of the stairs. If you do have throw rugs, attach them to the floor with carpet tape.  Make sure that you have a light switch at the top of the stairs and the bottom of the stairs. If you do not have them, ask someone to add them for you. What else can I do to help prevent falls?  Wear shoes that:  Do not have high heels.  Have rubber bottoms.  Are comfortable and fit you well.  Are closed at the toe. Do not  wear sandals.  If you use a stepladder:  Make sure that it is fully opened. Do not climb a closed stepladder.  Make sure that both sides of the stepladder are locked into place.  Ask someone to hold it for you, if possible.  Clearly mark and make sure that you can see:  Any grab bars or handrails.  First and last steps.  Where the edge of each step is.  Use tools that help you move around (mobility aids) if they are needed. These include:  Canes.  Walkers.  Scooters.  Crutches.  Turn on the lights when you go into a dark area. Replace any light bulbs as soon as they burn out.  Set up your furniture so you have a clear path. Avoid moving your furniture around.  If any of your floors are uneven, fix them.  If there are any pets around you, be aware of where they are.  Review your medicines with your doctor. Some medicines can make you feel dizzy. This can increase your chance of falling. Ask your doctor what other things that you can do to help prevent falls. This information is not intended to replace advice given to you by your health care provider. Make sure you discuss any questions you have with your health care provider. Document Released: 08/11/2009 Document Revised: 03/22/2016 Document Reviewed: 11/19/2014 Elsevier Interactive Patient Education  2017 [employer].

## 2019-11-01 ENCOUNTER — Other Ambulatory Visit: Payer: Self-pay | Admitting: Nurse Practitioner

## 2019-11-01 DIAGNOSIS — I10 Essential (primary) hypertension: Secondary | ICD-10-CM

## 2019-11-06 ENCOUNTER — Telehealth: Payer: Self-pay | Admitting: *Deleted

## 2019-11-06 DIAGNOSIS — Z87891 Personal history of nicotine dependence: Secondary | ICD-10-CM

## 2019-11-06 NOTE — Telephone Encounter (Signed)
Patient has been notified that annual lung cancer screening low dose CT scan is due currently or will be in near future. Confirmed that patient is within the age range of 55-77, and asymptomatic, (no signs or symptoms of lung cancer). Patient denies illness that would prevent curative treatment for lung cancer if found. Verified smoking history, (current, 103.5 pack year). The shared decision making visit was done 11/06/18. Patient is agreeable for CT scan being scheduled.

## 2019-11-13 ENCOUNTER — Ambulatory Visit
Admission: RE | Admit: 2019-11-13 | Discharge: 2019-11-13 | Disposition: A | Discharge status: Home / Self Care | Payer: Medicare Other | Source: Ambulatory Visit | Attending: Nurse Practitioner | Admitting: Nurse Practitioner

## 2019-11-13 ENCOUNTER — Other Ambulatory Visit: Payer: Self-pay

## 2019-11-13 DIAGNOSIS — Z87891 Personal history of nicotine dependence: Secondary | ICD-10-CM

## 2019-11-13 IMAGING — CT CT CHEST LUNG CANCER SCREENING LOW DOSE W/O CM
2 of 5 series · 15 of 40 positions shown, 18 images · non-contrast
Comparison: [DATE]

CLINICAL DATA: 73-year-old male with 104 pack-year history of
smoking. Lung cancer screening.

EXAM:
CT CHEST WITHOUT CONTRAST LOW-DOSE FOR LUNG CANCER SCREENING
TECHNIQUE: Multidetector CT imaging of the chest was performed following the
standard protocol without IV contrast.

[Series 3: lung 1.00 · axial · 0.81mm/px · z∈[-1198,-926]mm · 12 of 302 slices shown, 15 images]
[im 15/302  mediastinal]
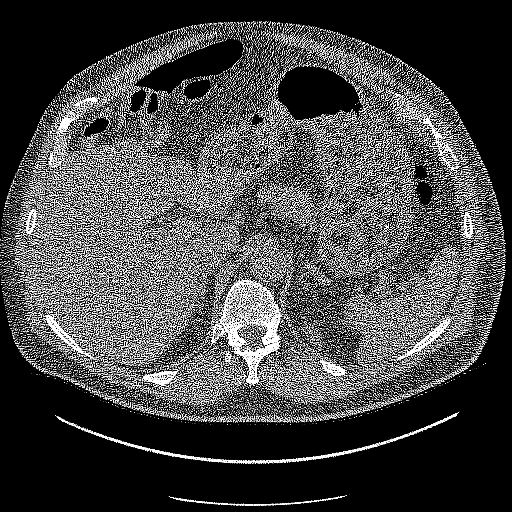
[im 15/302  lung]
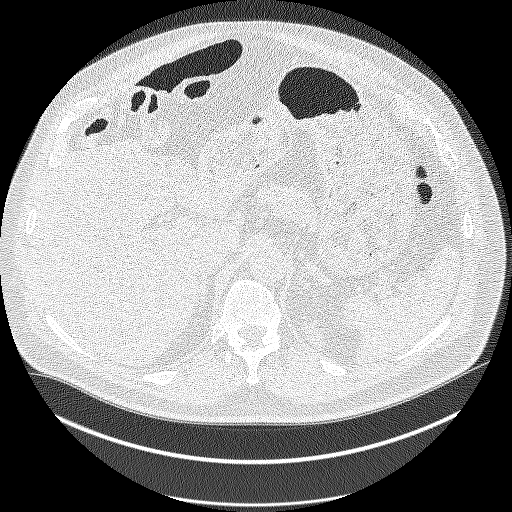
[im 44/302  lung]
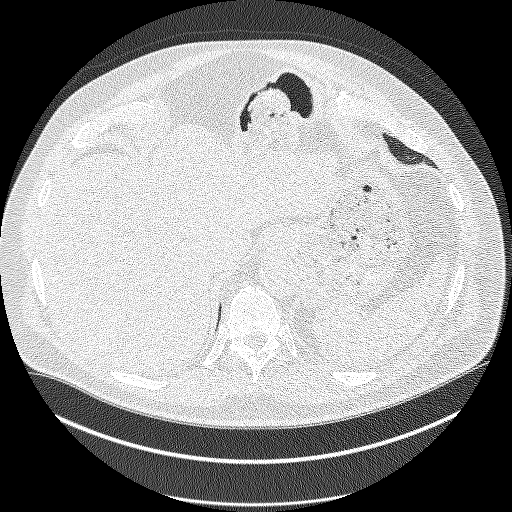
[im 72/302  lung]
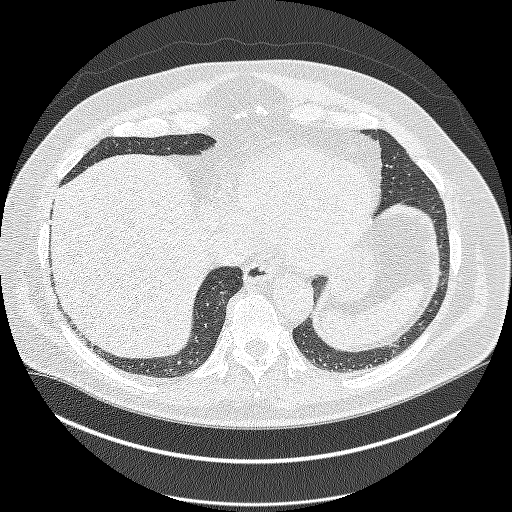
[im 87/302  lung]
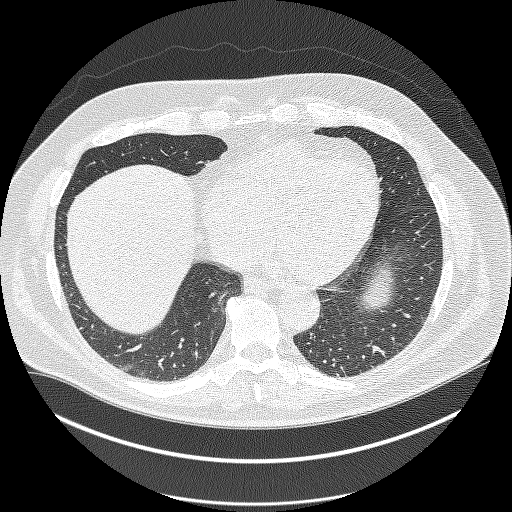
[im 115/302  mediastinal]
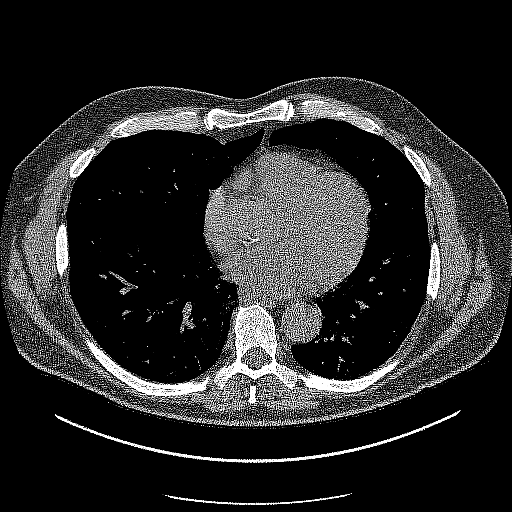
[im 115/302  lung]
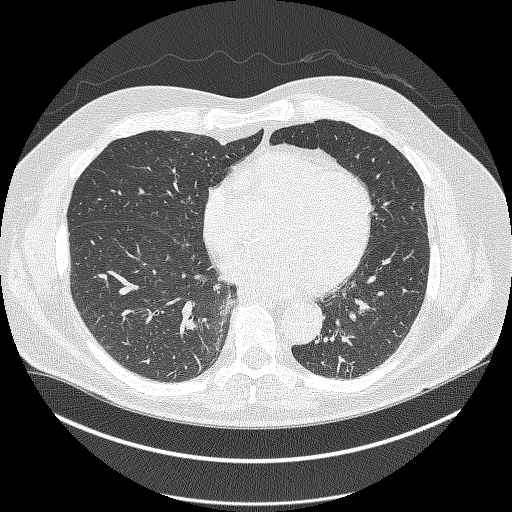
[im 144/302  lung]
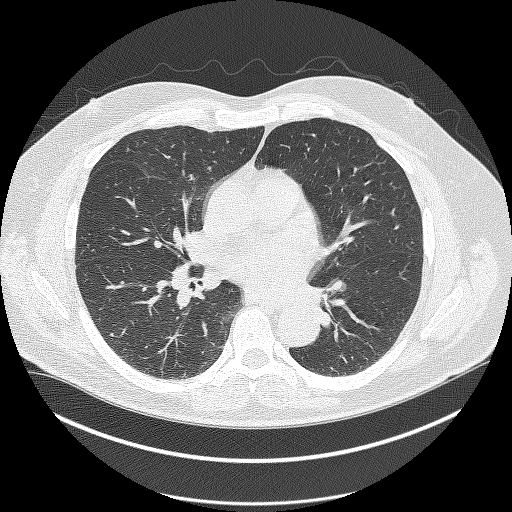
[im 158/302  lung]
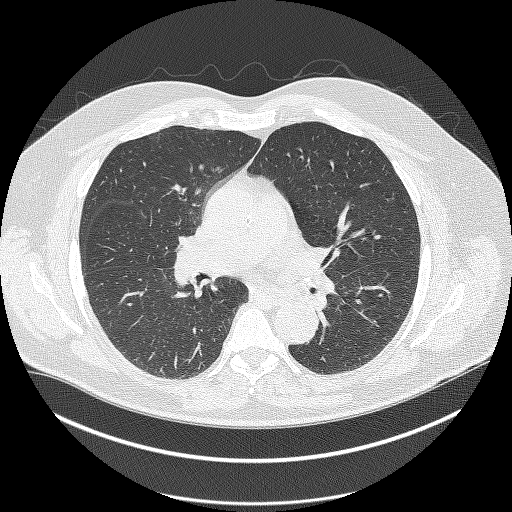
[im 187/302  lung]
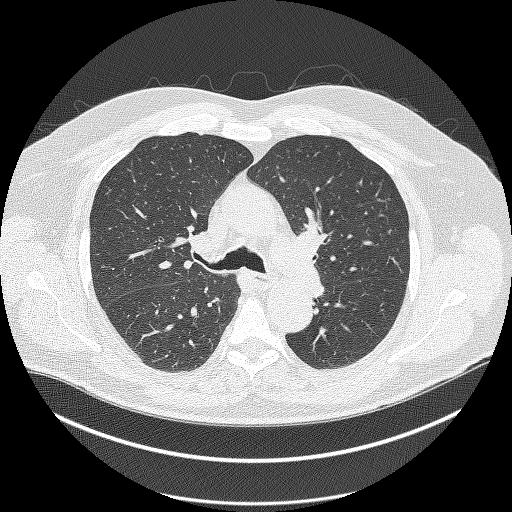
[im 216/302  mediastinal]
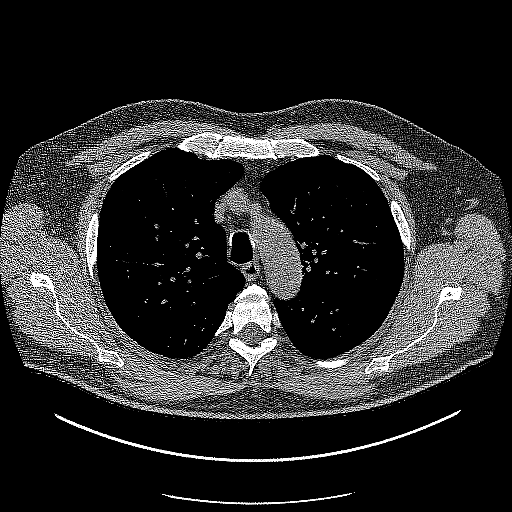
[im 216/302  lung]
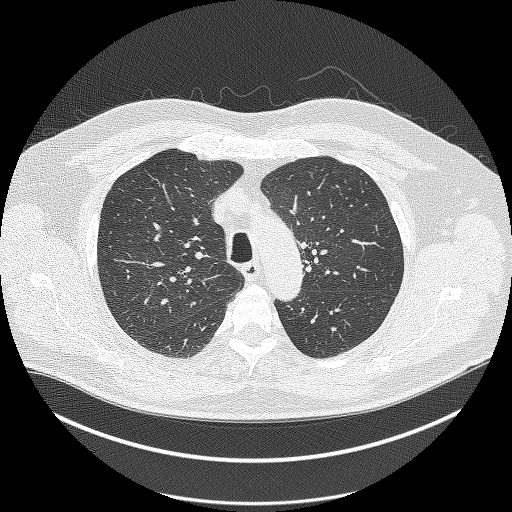
[im 230/302  lung]
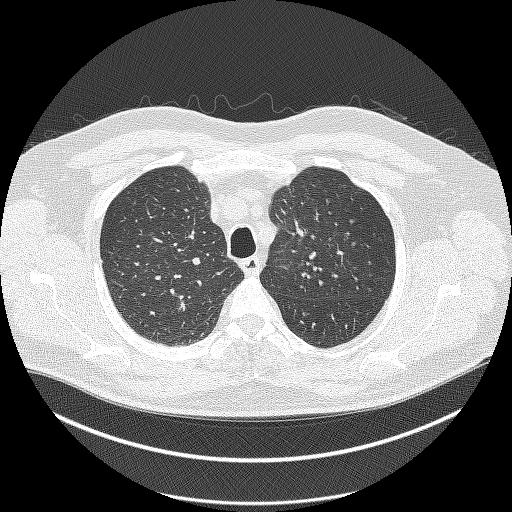
[im 259/302  lung]
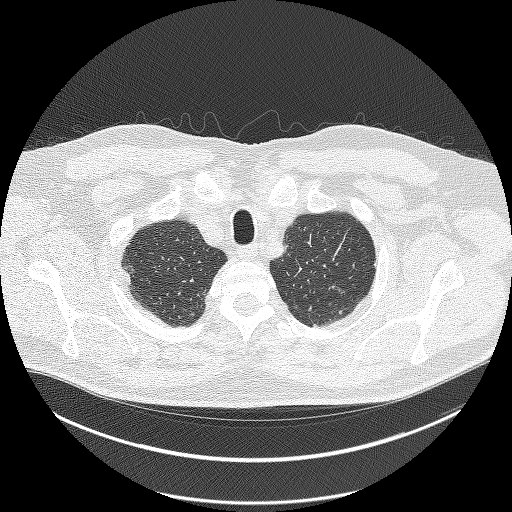
[im 287/302  lung]
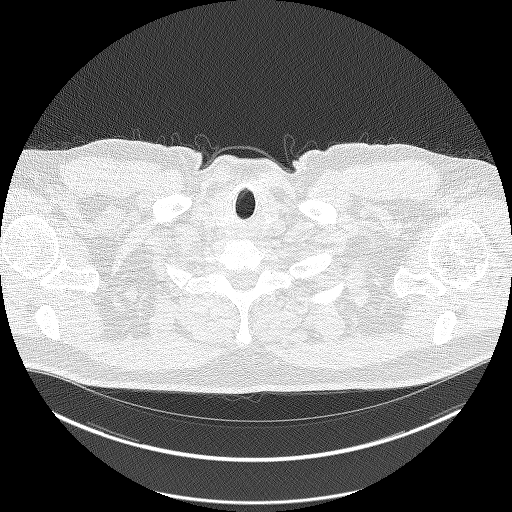

[Series 4: coronals lung 1.00 cor · coronal · 0.59mm/px · 3 of 407 slices shown]
[im 82/407  lung]
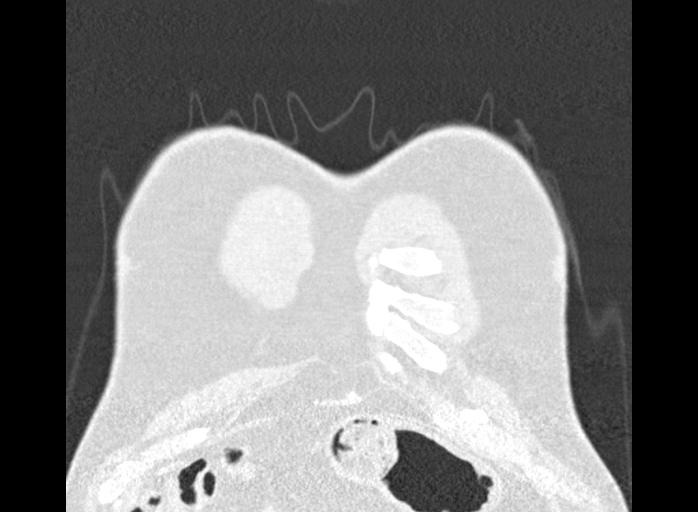
[im 163/407  lung]
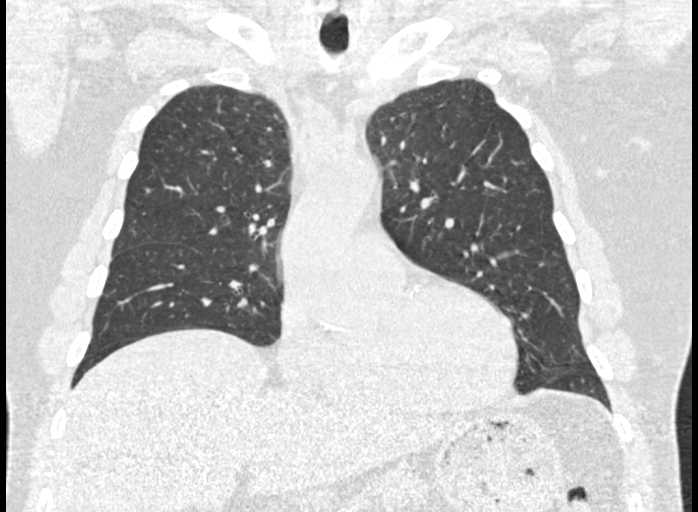
[im 244/407  lung]
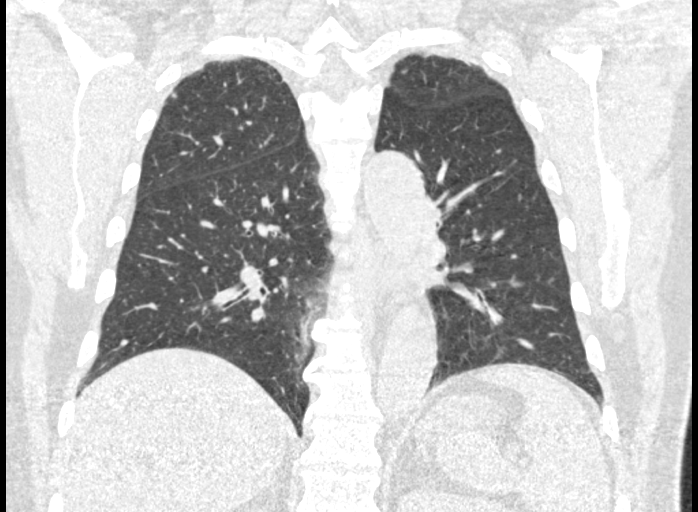

[15 of 40 positions shown; findings below may reference images not displayed]

FINDINGS: Cardiovascular: The heart size is normal. No substantial pericardial
effusion. Coronary artery calcification is evident. Atherosclerotic
calcification is noted in the wall of the thoracic aorta.

Mediastinum/Nodes: No mediastinal lymphadenopathy. No evidence for
gross hilar lymphadenopathy although assessment is limited by the
lack of intravenous contrast on today's study. The esophagus has
normal imaging features. There is no axillary lymphadenopathy.

Lungs/Pleura: Biapical pleuroparenchymal scarring noted.
Centrilobular emphsyema noted. Scattered bilateral pulmonary nodules
identified on the previous study show no substantial interval
change. No new suspicious pulmonary nodule or mass. No focal
airspace consolidation. No pleural effusion.

Upper Abdomen: Unremarkable.

Musculoskeletal: No worrisome lytic or sclerotic osseous
abnormality.
IMPRESSION: 1. Lung-RADS 2, benign appearance or behavior. Continue annual
screening with low-dose chest CT without contrast in 12 months.
2.  Emphysema. ([ZV]-[ZV])
3.  Aortic Atherosclerois ([ZV]-170.0)

## 2019-11-16 ENCOUNTER — Encounter: Payer: Self-pay | Admitting: *Deleted

## 2020-01-01 ENCOUNTER — Ambulatory Visit (INDEPENDENT_AMBULATORY_CARE_PROVIDER_SITE_OTHER): Payer: Medicare Other | Admitting: Family Medicine

## 2020-01-01 ENCOUNTER — Other Ambulatory Visit: Payer: Self-pay | Admitting: Family Medicine

## 2020-01-01 ENCOUNTER — Encounter: Payer: Self-pay | Admitting: Family Medicine

## 2020-01-01 ENCOUNTER — Other Ambulatory Visit: Payer: Self-pay

## 2020-01-01 VITALS — BP 136/77 | HR 100 | Temp 97.9°F | Resp 16 | Ht 76.0 in | Wt 263.0 lb

## 2020-01-01 DIAGNOSIS — I10 Essential (primary) hypertension: Secondary | ICD-10-CM

## 2020-01-01 DIAGNOSIS — K644 Residual hemorrhoidal skin tags: Secondary | ICD-10-CM | POA: Insufficient documentation

## 2020-01-01 DIAGNOSIS — M25562 Pain in left knee: Secondary | ICD-10-CM

## 2020-01-01 DIAGNOSIS — M25561 Pain in right knee: Secondary | ICD-10-CM

## 2020-01-01 DIAGNOSIS — J432 Centrilobular emphysema: Secondary | ICD-10-CM

## 2020-01-01 DIAGNOSIS — E669 Obesity, unspecified: Secondary | ICD-10-CM

## 2020-01-01 DIAGNOSIS — N138 Other obstructive and reflux uropathy: Secondary | ICD-10-CM

## 2020-01-01 DIAGNOSIS — M170 Bilateral primary osteoarthritis of knee: Secondary | ICD-10-CM

## 2020-01-01 DIAGNOSIS — R7303 Prediabetes: Secondary | ICD-10-CM | POA: Diagnosis not present

## 2020-01-01 DIAGNOSIS — G8929 Other chronic pain: Secondary | ICD-10-CM

## 2020-01-01 DIAGNOSIS — E7800 Pure hypercholesterolemia, unspecified: Secondary | ICD-10-CM

## 2020-01-01 DIAGNOSIS — Z0000 Encounter for general adult medical examination without abnormal findings: Secondary | ICD-10-CM

## 2020-01-01 DIAGNOSIS — N401 Enlarged prostate with lower urinary tract symptoms: Secondary | ICD-10-CM

## 2020-01-01 LAB — POCT GLYCOSYLATED HEMOGLOBIN (HGB A1C): Hemoglobin A1C: 5.9 % — AB (ref 4.0–5.6)

## 2020-01-01 NOTE — Progress Notes (Signed)
Subjective:    Patient ID: Jesse Gardner, male    DOB: [birth year], 74 y.o.   MRN: [id number]  Bastion Bolger is a 74 y.o. male presenting on 01/01/2020 for Hypertension   HPI   Pre-Diabetes Improved A1c trend from 6.2 to 6.0. Now due A1c Meds:None He is on ARB Lifestyle: - Diet (Reduced sugar added to tea and coffee, he has reduced desserts even more now, less cobbler) - Exercise (limited due to knee arthritis) Denies hypoglycemia, polyuria, visual changes, numbness or tingling.  Essential Hypertension/ Obesity BMI >32 Home BP has been in normal range mostly Current Meds -Losartan 50mg  daily Lifestyle: - Diet:Drinks up topot of coffee daily, drinks tea. Not drinking soft drinks. - Exercise:No longer going to the gym.Limited exercise Denies CP, dyspnea, HA, edema, dizziness / lightheadedness  Bilateral Knee Pain, Osteoarthritis Chronic problem. R>L. Known OA/DJD Worse aching after increased activity Has taken OTC Aleve, Tylenol PRN He uses OTC Dream cream, Osteobiflex Tried Knee wrap with limited results. Admits occasional leg swelling and knee swelling Denies injury or trauma, bruising, redness, other joint pain  External Hemorrhoids / Anal Bleeding / Itching Burning Reports occasional episodes with some spots of blood on toilet paper or wipes, he has known large external hemorrhoid for many years, has episodic flare.  Centrilobular Emphysema No recent flare up. Prior dx on LDCT. Not on maintenance therapy.  PMH Tobacco abuse  Health Maintenance:  Due for colon cancer screening yearly, last FOBT FIT test 08/2019 negative. Offer Cologuard he can reconsider by next apt, he is interested.   Depression screen [facility] 2/9 01/01/2020 09/22/2019 07/03/2019  Decreased Interest 0 0 0  Down, Depressed, Hopeless 0 0 0  PHQ - 2 Score 0 0 0    Past Medical History:  Diagnosis Date  . Hypertension    Past Surgical History:  Procedure Laterality Date  .  APPENDECTOMY    . HERNIA REPAIR     Social History   Socioeconomic History  . Marital status: Significant Other    Spouse name: Not on file  . Number of children: Not on file  . Years of education: Not on file  . Highest education level: Not on file  Occupational History  . Not on file  Tobacco Use  . Smoking status: Current Some Day Smoker    Packs/day: 0.25    Years: 57.00    Pack years: 14.25    Types: Cigarettes  . Smokeless tobacco: Never Used  . Tobacco comment: 1 cigarette in the last month   Substance and Sexual Activity  . Alcohol use: Yes    Alcohol/week: 1.0 standard drinks    Types: 1 Glasses of wine per week  . Drug use: Never  . Sexual activity: Not on file  Other Topics Concern  . Not on file  Social History Narrative  . Not on file   Social Determinants of Health   Financial Resource Strain: Low Risk   . Difficulty of Paying Living Expenses: Not hard at all  Food Insecurity: No Food Insecurity  . Worried About 07-22-1993 in the Last Year: Never true  . Ran Out of Food in the Last Year: Never true  Transportation Needs: No Transportation Needs  . Lack of Transportation (Medical): No  . Lack of Transportation (Non-Medical): No  Physical Activity: Inactive  . Days of Exercise per Week: 0 days  . Minutes of Exercise per Session: 0 min  Stress: Stress Concern Present  . Feeling of  Stress : To some extent  Social Connections: Somewhat Isolated  . Frequency of Communication with Friends and Family: Three times a week  . Frequency of Social Gatherings with Friends and Family: Three times a week  . Attends Religious Services: Never  . Active Member of Clubs or Organizations: No  . Attends Archivist Meetings: Never  . Marital Status: Living with partner  Intimate Partner Violence: Not At Risk  . Fear of Current or Ex-Partner: No  . Emotionally Abused: No  . Physically Abused: No  . Sexually Abused: No   Family History  Problem  Relation Age of Onset  . Alzheimer's disease Mother   . Diabetes Father   . Prostate cancer Brother    Current Outpatient Medications on File Prior to Visit  Medication Sig  . fluticasone (FLONASE) 50 MCG/ACT nasal spray Place 2 sprays into both nostrils daily. Use for 4-6 weeks then stop and use seasonally or as needed.  Marland Kitchen losartan (COZAAR) 50 MG tablet TAKE 1 TABLET(50 MG) BY MOUTH DAILY  . Misc Natural Products (OSTEO BI-FLEX TRIPLE STRENGTH PO) Take by mouth.  . naproxen sodium (ALEVE) 220 MG tablet Take 440 mg by mouth daily as needed.   No current facility-administered medications on file prior to visit.    Review of Systems Per HPI unless specifically indicated above      Objective:    BP 136/77   Pulse 100   Temp 97.9 F (36.6 C) (Temporal)   Resp 16   Ht 6\' 4"  (1.93 m)   Wt 263 lb (119.3 kg)   BMI 32.01 kg/m   Wt Readings from Last 3 Encounters:  01/01/20 263 lb (119.3 kg)  11/13/19 264 lb (119.7 kg)  09/22/19 264 lb (119.7 kg)    Physical Exam Vitals and nursing note reviewed.  Constitutional:      General: He is not in acute distress.    Appearance: He is well-developed. He is not diaphoretic.     Comments: Well-appearing, comfortable, cooperative  HENT:     Head: Normocephalic and atraumatic.  Eyes:     General:        Right eye: No discharge.        Left eye: No discharge.     Conjunctiva/sclera: Conjunctivae normal.  Neck:     Thyroid: No thyromegaly.  Cardiovascular:     Rate and Rhythm: Normal rate and regular rhythm.     Heart sounds: Normal heart sounds. No murmur.  Pulmonary:     Effort: Pulmonary effort is normal. No respiratory distress.     Breath sounds: Normal breath sounds. No wheezing or rales.  Musculoskeletal:        General: Normal range of motion.     Cervical back: Normal range of motion and neck supple.     Right lower leg: Edema (+1 pitting) present.     Left lower leg: Edema (+1 pitting edema) present.  Lymphadenopathy:       Cervical: No cervical adenopathy.  Skin:    General: Skin is warm and dry.     Findings: No erythema or rash.  Neurological:     Mental Status: He is alert and oriented to person, place, and time.  Psychiatric:        Behavior: Behavior normal.     Comments: Well groomed, good eye contact, normal speech and thoughts      Recent Labs    01/19/19 0808 06/29/19 0802 01/01/20 0959  HGBA1C 6.2* 6.0* 5.9*  Results for orders placed or performed in visit on 01/01/20  POCT HgB A1C  Result Value Ref Range   Hemoglobin A1C 5.9 (A) 4.0 - 5.6 %      Assessment & Plan:   Problem List Items Addressed This Visit    Pre-diabetes - Primary    Improved A1c from 6 down to 5.9 Concern with obesity, HTN, HLD  Plan:  1. Not on any therapy currently  2. Encourage improved lifestyle - low carb, low sugar diet, reduce portion size, continue improving regular exercise       Relevant Orders   POCT HgB A1C (Completed)   Osteoarthritis of knees, bilateral    Subacute on chronic R>L generalized vs upper lateral aspect Knee pain and swelling without known injury or trauma  Confirmed bilateral OA/DJD knees on last X-ray 09/2018 - Able to bear weight, possible rare knee instability but no locking - No prior history of knee surgery, arthroscopy  Plan: Anticipate he will be interested in refer to Ortho / PT in future. Otherwise consider Rx NSAID PRN, Muscle relaxant injection - Continue w/ NSAID OTC Aleve - Can still use Tylenol 500-1000mg  per dose TID PRN breakthrough - RICE therapy (rest, ice, compression, elevation) for swelling, activity modification  F/u PRN      Obesity (BMI 30.0-34.9)    Weight down 1 lb Encourage lifestyle diet exercise       External hemorrhoids    Stable, chronic problem Mild symptoms episodic Offered OTC treatment and counseling Consider future surgical refer if indicated F/u      Centrilobular emphysema (HCC)    Chronic COPD emphysema New  identified 10/2018 on CT Some increased work of breathing Relatively new diagnosis of chronic problem, with active tobacco abuse long history Declined maintenance therapy inhalers  Counseling provided Follow yearly LDCT Lung Scan      Bilateral chronic knee pain      No orders of the defined types were placed in this encounter.    Follow up plan: Return in about 6 months (around 07/03/2020) for Annual Physical.   Future labs 07/06/20  Saralyn Pilar, DO [facility] 01/01/2020, 9:50 AM

## 2020-01-01 NOTE — Assessment & Plan Note (Signed)
Improved A1c from 6 down to 5.9 Concern with obesity, HTN, HLD  Plan:  1. Not on any therapy currently  2. Encourage improved lifestyle - low carb, low sugar diet, reduce portion size, continue improving regular exercise

## 2020-01-01 NOTE — Assessment & Plan Note (Signed)
Chronic COPD emphysema New identified 10/2018 on CT Some increased work of breathing Relatively new diagnosis of chronic problem, with active tobacco abuse long history Declined maintenance therapy inhalers  Counseling provided Follow yearly LDCT Lung Scan

## 2020-01-01 NOTE — Patient Instructions (Addendum)
Thank you for coming to the office today.  Let me know when ready for referral to Orthopedic to see PT as well to help with knees.  Recent Labs    01/19/19 0808 06/29/19 0802 01/01/20 0959  HGBA1C 6.2* 6.0* 5.9*   For hemorrhoids, your symptoms and small amount of bleeding on toilet paper is normal. Can keep using topical cream as needed. Let me know if want to see a surgeon at some point  Colon Cancer Screening: - For all adults age 74+ routine colon cancer screening is highly recommended.     - Recent guidelines from [place] recommend starting age of 15 - Early detection of colon cancer is important, because often there are no warning signs or symptoms, also if found early usually it can be cured. Late stage is hard to treat.  - If you are not interested in Colonoscopy screening (if done and normal you could be cleared for 5 to 10 years until next due), then Cologuard is an excellent alternative for screening test for Colon Cancer. It is highly sensitive for detecting DNA of colon cancer from even the earliest stages. Also, there is NO bowel prep required. - If Cologuard is NEGATIVE, then it is good for 3 years before next due - If Cologuard is POSITIVE, then it is strongly advised to get a Colonoscopy, which allows the GI doctor to locate the source of the cancer or polyp (even very early stage) and treat it by removing it. ------------------------- If you would like to proceed with Cologuard (stool DNA test) - FIRST, call your insurance company and tell them you want to check cost of Cologuard tell them CPT Code [phone] (it may be completely covered and you could get for no cost, OR max cost without any coverage is about $600). Also, keep in mind if you do NOT open the kit, and decide not to do the test, you will NOT be charged, you should contact the company if you decide not to do the test. - If you want to proceed, you can notify us (phone message, [place], or at  next visit) and we will order it for you. The test kit will be delivered to you house within about 1 week. Follow instructions to collect sample, you may call the company for any help or questions, 24/7 telephone support at [phone].    DUE for FASTING BLOOD WORK (no food or drink after midnight before the lab appointment, only water or coffee without cream/sugar on the morning of)  SCHEDULE "Lab Only" visit in the morning at the clinic for lab draw in 6 MONTHS   - Make sure Lab Only appointment is at about 1 week before your next appointment, so that results will be available  For Lab Results, once available within 2-3 days of blood draw, you can can log in to MyChart online to view your results and a brief explanation. Also, we can discuss results at next follow-up visit.   Please schedule a Follow-up Appointment to: Return in about 6 months (around 07/03/2020) for Annual Physical.  If you have any other questions or concerns, please feel free to call the office or send a message through [place]. You may also schedule an earlier appointment if necessary.  Additionally, you may be receiving a survey about your experience at our office within a few days to 1 week by e-mail or mail. We value your feedback.  Nobie Putnam, DO [place]

## 2020-01-01 NOTE — Assessment & Plan Note (Signed)
Stable, chronic problem Mild symptoms episodic Offered OTC treatment and counseling Consider future surgical refer if indicated F/u

## 2020-01-01 NOTE — Assessment & Plan Note (Signed)
Weight down 1 lb Encourage lifestyle diet exercise

## 2020-01-01 NOTE — Assessment & Plan Note (Signed)
Subacute on chronic R>L generalized vs upper lateral aspect Knee pain and swelling without known injury or trauma  Confirmed bilateral OA/DJD knees on last X-ray 09/2018 - Able to bear weight, possible rare knee instability but no locking - No prior history of knee surgery, arthroscopy  Plan: Anticipate he will be interested in refer to Ortho / PT in future. Otherwise consider Rx NSAID PRN, Muscle relaxant injection - Continue w/ NSAID OTC Aleve - Can still use Tylenol 500-1000mg  per dose TID PRN breakthrough - RICE therapy (rest, ice, compression, elevation) for swelling, activity modification  F/u PRN

## 2020-05-04 ENCOUNTER — Other Ambulatory Visit: Payer: Self-pay | Admitting: Family Medicine

## 2020-05-04 DIAGNOSIS — I10 Essential (primary) hypertension: Secondary | ICD-10-CM

## 2020-05-04 NOTE — Telephone Encounter (Signed)
Patient can wait till his apt for refill --has 5 days worth of medication left.

## 2020-05-04 NOTE — Telephone Encounter (Signed)
Can you contact patient to see if he has enough medicine Losartan 50mg  - to wait until 7/9 for refill at the appointment?  If he is completely out I can re order, but prefer to wait until appointment if he can.  9/9, DO [facility] 05/04/2020, 9:54 AM

## 2020-05-04 NOTE — Telephone Encounter (Signed)
Requested medications are due for refill today? Yes  Requested medications are on active medication list?  Yes  Last Refill:   11/02/2019  # 90 with one refill  Future visit scheduled?  Yes in 2 months  Notes to Clinic:  Medication failed RX refill protocol due to no labs within past 180 days.  Last labs performed on 06/29/2019.

## 2020-05-06 ENCOUNTER — Ambulatory Visit (INDEPENDENT_AMBULATORY_CARE_PROVIDER_SITE_OTHER): Payer: Medicare Other | Admitting: Family Medicine

## 2020-05-06 ENCOUNTER — Other Ambulatory Visit: Payer: Self-pay

## 2020-05-06 ENCOUNTER — Encounter: Payer: Self-pay | Admitting: Family Medicine

## 2020-05-06 VITALS — BP 124/74 | HR 123 | Temp 97.7°F | Resp 16 | Ht 76.0 in | Wt 255.6 lb

## 2020-05-06 DIAGNOSIS — N401 Enlarged prostate with lower urinary tract symptoms: Secondary | ICD-10-CM

## 2020-05-06 DIAGNOSIS — N138 Other obstructive and reflux uropathy: Secondary | ICD-10-CM | POA: Diagnosis not present

## 2020-05-06 DIAGNOSIS — N3001 Acute cystitis with hematuria: Secondary | ICD-10-CM | POA: Diagnosis not present

## 2020-05-06 DIAGNOSIS — N390 Urinary tract infection, site not specified: Secondary | ICD-10-CM | POA: Diagnosis not present

## 2020-05-06 DIAGNOSIS — I10 Essential (primary) hypertension: Secondary | ICD-10-CM | POA: Diagnosis not present

## 2020-05-06 DIAGNOSIS — R319 Hematuria, unspecified: Secondary | ICD-10-CM | POA: Diagnosis not present

## 2020-05-06 LAB — POCT URINALYSIS DIPSTICK
Bilirubin, UA: NEGATIVE
Glucose, UA: NEGATIVE
Ketones, UA: NEGATIVE
Nitrite, UA: NEGATIVE
Protein, UA: POSITIVE — AB
Spec Grav, UA: 1.02 (ref 1.010–1.025)
Urobilinogen, UA: 0.2 E.U./dL
pH, UA: 5 (ref 5.0–8.0)

## 2020-05-06 MED ORDER — CEPHALEXIN 500 MG PO CAPS: 500.0000 mg | ORAL_CAPSULE | Freq: Three times a day (TID) | ORAL | 0 refills | Status: DC

## 2020-05-06 MED ORDER — LOSARTAN POTASSIUM 50 MG PO TABS: 50.0000 mg | ORAL_TABLET | Freq: Every day | ORAL | 3 refills | Status: DC

## 2020-05-06 NOTE — Assessment & Plan Note (Signed)
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 Uncertain if this was gross hematuria based on history and current sample. Was positive for blood.  May benefit from further management on BPH  Future PSA monitoring

## 2020-05-06 NOTE — Patient Instructions (Addendum)
Thank you for coming to the office today.  1. You may have a Urinary Tract Infection - this is very common, your symptoms are reassuring and you should get better within 1 week on the antibiotics - Start Keflex 500mg  3 times daily for next 7 days, complete entire course, even if feeling better - We sent urine for a culture, we will call you within next few days if we need to change antibiotics - Please drink plenty of fluids, improve hydration over next 1 week  If symptoms worsening, developing nausea / vomiting, worsening back pain, fevers / chills / sweats, then please return for re-evaluation sooner.  Drink enough water to keep your pee clear to pale yellow   If the urine does not show bacteria - OR it does not improve - we will refer you to Urologist.  [facility] -1st floor [address] [place],  [place]  [place] Phone: [phone]  Keep your apt upcoming for FASTING Lab only orders.  We can order Cologuard at next visit if ready   Please schedule a Follow-up Appointment to: Return if symptoms worsen or fail to improve, for keep scheduled apt for lab and physical in September .  If you have any other questions or concerns, please feel free to call the office or send a message through MyChart. You may also schedule an earlier appointment if necessary.  Additionally, you may be receiving a survey about your experience at our office within a few days to 1 week by e-mail or mail. We value your feedback.  October, DO [facility], [facility]

## 2020-05-06 NOTE — Progress Notes (Signed)
Subjective:    Patient ID: Jesse Gardner, male    DOB: [birth year], 74 y.o.   MRN: [id number]  Ethon Wymer is a 74 y.o. male presenting on 05/06/2020 for Hematuria (as per pt notice dark brown urine intermitten not all the time )   HPI   Essential Hypertension/ Obesity BMI >31 Home BP has been in normal range mostly at home wrist cuff. He said cuff has told him "irregular heart beat". He says many years ago 50+ he attempted to apply to flight school but had heart irregularity and he had repeat EKGs. No actual problem or diagnosis. Current Meds -Losartan 50mg  daily Lifestyle: - Diet:Drinks up topot of coffee daily, drinks tea. Not drinking soft drinks. - Exercise:No longer going to the gym.Limited exercise Denies CP, dyspnea, HA, edema, dizziness / lightheadedness, palpitations  Dark Urine, intermittent BPH LUTS Known history of BPH. He has history of reduced urinary stream. Prior PSA negative 12/2018 He does admit this onset was after COVID19 vaccine but unsure exactly when. He has had several episodes for past few  Admits urinary urgency and frequency Denies any dysuria, hematuria  Health Maintenance: Due Cologuard. He has done FIT test from insurance 08/2019. He will anticipate cologuard at next physical 06/2020.  Due 07/2020 will get at next physical  Depression screen [facility] 2/9 01/01/2020 09/22/2019 07/03/2019  Decreased Interest 0 0 0  Down, Depressed, Hopeless 0 0 0  PHQ - 2 Score 0 0 0    Social History   Tobacco Use  . Smoking status: Current Some Day Smoker    Packs/day: 0.25    Years: 57.00    Pack years: 14.25    Types: Cigarettes  . Smokeless tobacco: Current User  . Tobacco comment: 1 cigarette in the last month   Vaping Use  . Vaping Use: Never used  Substance Use Topics  . Alcohol use: Yes    Alcohol/week: 1.0 standard drink    Types: 1 Glasses of wine per week  . Drug use: Never    Review of Systems Per HPI unless specifically indicated  above     Objective:    BP 124/74   Pulse (!) 123   Temp 97.7 F (36.5 C) (Temporal)   Resp 16   Ht 6\' 4"  (1.93 m)   Wt 255 lb 9.6 oz (115.9 kg)   SpO2 96%   BMI 31.11 kg/m   Wt Readings from Last 3 Encounters:  05/06/20 255 lb 9.6 oz (115.9 kg)  01/01/20 263 lb (119.3 kg)  11/13/19 264 lb (119.7 kg)    Physical Exam Vitals and nursing note reviewed.  Constitutional:      General: He is not in acute distress.    Appearance: He is well-developed. He is not diaphoretic.     Comments: Well-appearing, comfortable, cooperative  HENT:     Head: Normocephalic and atraumatic.  Eyes:     General:        Right eye: No discharge.        Left eye: No discharge.     Conjunctiva/sclera: Conjunctivae normal.  Cardiovascular:     Rate and Rhythm: Normal rate.     Pulses: Normal pulses.     Heart sounds: No murmur heard.   Pulmonary:     Effort: Pulmonary effort is normal.     Breath sounds: Normal breath sounds. No wheezing, rhonchi or rales.  Skin:    General: Skin is warm and dry.     Findings: No erythema or  rash.  Neurological:     Mental Status: He is alert and oriented to person, place, and time.  Psychiatric:        Behavior: Behavior normal.     Comments: Well groomed, good eye contact, normal speech and thoughts       Results for orders placed or performed in visit on 05/06/20  POCT Urinalysis Dipstick  Result Value Ref Range   Color, UA dark brown    Clarity, UA cloudy    Glucose, UA Negative Negative   Bilirubin, UA Negative    Ketones, UA Negative    Spec Grav, UA 1.020 1.010 - 1.025   Blood, UA large    pH, UA 5.0 5.0 - 8.0   Protein, UA Positive (A) Negative   Urobilinogen, UA 0.2 0.2 or 1.0 E.U./dL   Nitrite, UA Negative    Leukocytes, UA Large (3+) (A) Negative   Appearance     Odor        Assessment & Plan:   Problem List Items Addressed This Visit    Essential hypertension    Controlled HTN. - Home BP readings improved avg  No known  complications     Plan:  1. Continue current med - Losartan 50mg  daily - re order today 2. Encourage improved lifestyle - low sodium diet, regular exercise 3. 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 Uncertain if this was gross hematuria based on history and current sample. Was positive for blood.  May benefit from further management on BPH  Future PSA monitoring       Other Visit Diagnoses    Acute cystitis with hematuria    -  Primary   Relevant Medications   cephALEXin (KEFLEX) 500 MG capsule   Other Relevant Orders   POCT Urinalysis Dipstick (Completed)   Urine Culture      Clinically with dark urine, abnormal urinalysis dipstick concerning for possible UTI with hematuria. Possibly related to prostate Lack of acute UTI symptoms but has progression of BPH LUTS see A&P Poor hydration in general, mostly coffee tea No concern for pyelo today (no systemic symptoms, neg fever, back pain, n/v).  Plan: 1. UA dipstick 2. Ordered Urine culture 3. Keflex 500mg  TID x 7 days - empiric course 4. Improve PO hydration Return if no improvement 1-2 weeks, red flags given to return sooner  He has upcoming physical regardless in 06/2020.  Advised we will anticipate referral to urology based on test results and his clinical course.    Orders Placed This Encounter  Procedures  . Urine Culture  . POCT Urinalysis Dipstick     Meds ordered this encounter  Medications  . losartan  (COZAAR) 50 MG tablet    Sig: Take 1 tablet (50 mg total) by mouth daily.    Dispense:  90 tablet    Refill:  3    **Patient requests 90 days supply**  . cephALEXin (KEFLEX) 500 MG capsule    Sig: Take 1 capsule (500 mg total) by mouth 3 (  three) times daily. For 7 days    Dispense:  21 capsule    Refill:  0      Follow up plan: Return if symptoms worsen or fail to improve, for keep scheduled apt for lab and physical in September .  Future orders already in for 07/06/20  Saralyn Pilar, DO [facility] 05/06/2020, 11:15 AM

## 2020-05-06 NOTE — Assessment & Plan Note (Signed)
Controlled HTN. - Home BP readings improved avg  No known complications     Plan:  1. Continue current med - Losartan 50mg daily - re order today 2. Encourage improved lifestyle - low sodium diet, regular exercise 3. Continue monitor BP outside office, bring readings to next visit, if persistently >140/90 or new symptoms notify office sooner 

## 2020-05-08 LAB — URINE CULTURE
MICRO NUMBER:: 10690694
Result:: NO GROWTH
SPECIMEN QUALITY:: ADEQUATE

## 2020-05-09 ENCOUNTER — Other Ambulatory Visit: Payer: Self-pay | Admitting: Family Medicine

## 2020-05-09 DIAGNOSIS — N138 Other obstructive and reflux uropathy: Secondary | ICD-10-CM

## 2020-05-09 DIAGNOSIS — R82998 Other abnormal findings in urine: Secondary | ICD-10-CM

## 2020-05-24 NOTE — Progress Notes (Signed)
05/25/2020 10:54 AM   [name] 10/13/1946 [id number]  Referring provider: Smitty Cords, DO [address] [place],  [place] [address] Chief Complaint  Patient presents with  . Benign Prostatic Hypertrophy    HPI: Jesse Gardner is a 74 y.o. male with a history of BPH with obstruction/LUTS and dark urine presents today for evaluation and management.   Patient last saw his PCP on 05/06/2020. He complained of hematuria described as intermittent dark brown urine. He had urgency and frequency. Denied dysuria. He drinks a pot of coffee per day and he likes to drink tea. Patient does not drink soda. Patient was treated with keflex 500 mg TID x 7 days. He was encouraged to push fluids and eat a low sodium diet.   UA showed large blood, protein positive and 3+ leukocytes. Urine culture was negative.   He has a personal history of BPH with obstruction/LUTS. He has history of reduced urinary stream. Not on any BPH mediation.  Reports hematuria. He has no burning with urination.  Denies blood clots.  Former smoker. He is trying to cut back and quit smoking.   Most recent PSA a year ago 1.8 on 12/2018.   IPSS    Row Name 05/25/20 1500         International Prostate Symptom Score   How often have you had the sensation of not emptying your bladder? Less than 1 in 5     How often have you had to urinate less than every two hours? Less than 1 in 5 times     How often have you found you stopped and started again several times when you urinated? Less than half the time     How often have you found it difficult to postpone urination? Less than 1 in 5 times     How often have you had a weak urinary stream? More than half the time     How often have you had to strain to start urination? Not at All     How many times did you typically get up at night to urinate? 1 Time     Total IPSS Score 10       Quality of Life due to urinary symptoms   If you were to spend the rest of your life with  your urinary condition just the way it is now how would you feel about that? Unhappy            Score:  1-7 Mild 8-19 Moderate 20-35 Severe   PMH: Past Medical History:  Diagnosis Date  . Hypertension     Surgical History: Past Surgical History:  Procedure Laterality Date  . APPENDECTOMY    . HERNIA REPAIR      Home Medications:  Allergies as of 05/25/2020   No Known Allergies     Medication List       Accurate as of May 25, 2020 11:59 PM. If you have any questions, ask your nurse or doctor.        STOP taking these medications   cephALEXin 500 MG capsule Commonly known as: KEFLEX Stopped by: Vanna Scotland, MD     TAKE these medications   fluticasone 50 MCG/ACT nasal spray Commonly known as: FLONASE Place 2 sprays into both nostrils daily. Use for 4-6 weeks then stop and use seasonally or as needed.   losartan 50 MG tablet Commonly known as: COZAAR Take 1 tablet (50 mg total) by mouth daily.  Melatonin 5 MG Caps Take by mouth.   naproxen sodium 220 MG tablet Commonly known as: ALEVE Take 440 mg by mouth daily as needed.   OSTEO BI-FLEX TRIPLE STRENGTH PO Take by mouth.       Allergies: No Known Allergies  Family History: Family History  Problem Relation Age of Onset  . Alzheimer's disease Mother   . Diabetes Father   . Prostate cancer Brother     Social History:  reports that he has been smoking cigarettes. He has a 14.25 pack-year smoking history. He uses smokeless tobacco. He reports current alcohol use of about 1.0 standard drink of alcohol per week. He reports that he does not use drugs.   Physical Exam: BP (!) 150/83   Pulse (!) 121   Ht 6\' 4"  (1.93 m)   Wt (!) 262 lb (118.8 kg)   BMI 31.89 kg/m   Constitutional:  Alert and oriented, No acute distress. HEENT: [place] AT, moist mucus membranes.  Trachea midline, no masses. Cardiovascular: No clubbing, cyanosis, or edema. Respiratory: Normal respiratory effort, no increased work  of breathing. Skin: No rashes, bruises or suspicious lesions. Neurologic: Grossly intact, no focal deficits, moving all 4 extremities. Psychiatric: Normal mood and affect.  Laboratory Data:   Lab Results  Component Value Date   HGBA1C 5.9 (A) 01/01/2020    Urinalysis shows WBC >30, 11-30 RBC, nitrite positive, moderate bacteria.  Pertinent Imaging: Results for orders placed or performed in visit on 05/25/20  BLADDER SCAN AMB NON-IMAGING  Result Value Ref Range   Scan Result 0 ML      Assessment & Plan:    1. Gross hematuria High risk due to smoking history. UA shows WBC >30, 11-30 RBC, nitrite positive, moderate bacteria. Discussed a CT urogram and cystoscopy.  Patient agreed.  2. Pyuria  UA concerning for infection Patient has no symptoms other than baseline -Urine culture sent to rule out infection. Treat as needed  3. BPH w/ obstruction/LUTS He is not on any medication currently, will consider pharmaceutical treatment vs intervention in the near future. PSA is reassuring and will hold off on repeat PSA for now.  PVR is 0 mL. IPSS score 10/35, moderate.   [facility] [address], [address] [place], [place] [phone]  I, [phone], am acting as a scribe for Dr. Theador Hawthorne.  I have reviewed the above documentation for accuracy and completeness, and I agree with the above.   Vanna Scotland, MD

## 2020-05-25 ENCOUNTER — Ambulatory Visit (INDEPENDENT_AMBULATORY_CARE_PROVIDER_SITE_OTHER): Payer: Medicare Other | Admitting: Urology

## 2020-05-25 ENCOUNTER — Other Ambulatory Visit: Payer: Self-pay

## 2020-05-25 ENCOUNTER — Encounter: Payer: Self-pay | Admitting: Urology

## 2020-05-25 VITALS — BP 150/83 | HR 121 | Ht 76.0 in | Wt 262.0 lb

## 2020-05-25 DIAGNOSIS — R310 Gross hematuria: Secondary | ICD-10-CM | POA: Diagnosis not present

## 2020-05-25 DIAGNOSIS — N401 Enlarged prostate with lower urinary tract symptoms: Secondary | ICD-10-CM | POA: Diagnosis not present

## 2020-05-25 DIAGNOSIS — N138 Other obstructive and reflux uropathy: Secondary | ICD-10-CM

## 2020-05-25 LAB — BLADDER SCAN AMB NON-IMAGING: Scan Result: 0

## 2020-05-25 NOTE — Patient Instructions (Signed)
Cystoscopy Cystoscopy is a procedure that is used to help diagnose and sometimes treat conditions that affect the lower urinary tract. The lower urinary tract includes the bladder and the urethra. The urethra is the tube that drains urine from the bladder. Cystoscopy is done using a thin, tube-shaped instrument with a light and camera at the end (cystoscope). The cystoscope may be hard or flexible, depending on the goal of the procedure. The cystoscope is inserted through the urethra, into the bladder. Cystoscopy may be recommended if you have:  Urinary tract infections that keep coming back.  Blood in the urine (hematuria).  An inability to control when you urinate (urinary incontinence) or an overactive bladder.  Unusual cells found in a urine sample.  A blockage in the urethra, such as a urinary stone.  Painful urination.  An abnormality in the bladder found during an intravenous pyelogram (IVP) or CT scan. Cystoscopy may also be done to remove a sample of tissue to be examined under a microscope (biopsy). What are the risks? Generally, this is a safe procedure. However, problems may occur, including:  Infection.  Bleeding.  What happens during the procedure?  1. You will be given one or more of the following: ? A medicine to numb the area (local anesthetic). 2. The area around the opening of your urethra will be cleaned. 3. The cystoscope will be passed through your urethra into your bladder. 4. Germ-free (sterile) fluid will flow through the cystoscope to fill your bladder. The fluid will stretch your bladder so that your health care provider can clearly examine your bladder walls. 5. Your doctor will look at the urethra and bladder. 6. The cystoscope will be removed The procedure may vary among health care providers  What can I expect after the procedure? After the procedure, it is common to have: 1. Some soreness or pain in your abdomen and urethra. 2. Urinary symptoms.  These include: ? Mild pain or burning when you urinate. Pain should stop within a few minutes after you urinate. This may last for up to 1 week. ? A small amount of blood in your urine for several days. ? Feeling like you need to urinate but producing only a small amount of urine. Follow these instructions at home: General instructions  Return to your normal activities as told by your health care provider.   Do not drive for 24 hours if you were given a sedative during your procedure.  Watch for any blood in your urine. If the amount of blood in your urine increases, call your health care provider.  If a tissue sample was removed for testing (biopsy) during your procedure, it is up to you to get your test results. Ask your health care provider, or the department that is doing the test, when your results will be ready.  Drink enough fluid to keep your urine pale yellow.  Keep all follow-up visits as told by your health care provider. This is important. Contact a health care provider if you:  Have pain that gets worse or does not get better with medicine, especially pain when you urinate.  Have trouble urinating.  Have more blood in your urine. Get help right away if you:  Have blood clots in your urine.  Have abdominal pain.  Have a fever or chills.  Are unable to urinate. Summary  Cystoscopy is a procedure that is used to help diagnose and sometimes treat conditions that affect the lower urinary tract.  Cystoscopy is done using   a thin, tube-shaped instrument with a light and camera at the end.  After the procedure, it is common to have some soreness or pain in your abdomen and urethra.  Watch for any blood in your urine. If the amount of blood in your urine increases, call your health care provider.  If you were prescribed an antibiotic medicine, take it as told by your health care provider. Do not stop taking the antibiotic even if you start to feel better. This  information is not intended to replace advice given to you by your health care provider. Make sure you discuss any questions you have with your health care provider. Document Revised: 10/07/2018 Document Reviewed: 10/07/2018 Elsevier Patient Education  2020 Elsevier Inc.   

## 2020-05-26 LAB — MICROSCOPIC EXAMINATION: WBC, UA: >30 /hpf — AB (ref 0–5)

## 2020-05-26 LAB — URINALYSIS, COMPLETE
Bilirubin, UA: NEGATIVE
Nitrite, UA: POSITIVE — AB
Specific Gravity, UA: >1.03 — ABNORMAL HIGH (ref 1.005–1.030)
Urobilinogen, Ur: 1 mg/dL (ref 0.2–1.0)
pH, UA: 5.5 (ref 5.0–7.5)

## 2020-05-27 ENCOUNTER — Other Ambulatory Visit: Payer: Self-pay

## 2020-05-27 DIAGNOSIS — R310 Gross hematuria: Secondary | ICD-10-CM

## 2020-05-28 LAB — CULTURE, URINE COMPREHENSIVE

## 2020-05-30 ENCOUNTER — Other Ambulatory Visit: Payer: Self-pay

## 2020-05-30 ENCOUNTER — Other Ambulatory Visit
Admission: RE | Admit: 2020-05-30 | Discharge: 2020-05-30 | Disposition: A | Discharge status: Home / Self Care | Payer: Medicare Other | Source: Home / Self Care | Attending: Urology | Admitting: Urology

## 2020-05-30 ENCOUNTER — Ambulatory Visit
Admission: RE | Admit: 2020-05-30 | Discharge: 2020-05-30 | Disposition: A | Discharge status: Home / Self Care | Payer: Medicare Other | Source: Ambulatory Visit | Attending: Urology | Admitting: Urology

## 2020-05-30 ENCOUNTER — Telehealth: Payer: Self-pay

## 2020-05-30 DIAGNOSIS — N281 Cyst of kidney, acquired: Secondary | ICD-10-CM | POA: Diagnosis not present

## 2020-05-30 DIAGNOSIS — K4020 Bilateral inguinal hernia, without obstruction or gangrene, not specified as recurrent: Secondary | ICD-10-CM | POA: Diagnosis not present

## 2020-05-30 DIAGNOSIS — R310 Gross hematuria: Secondary | ICD-10-CM | POA: Insufficient documentation

## 2020-05-30 DIAGNOSIS — R188 Other ascites: Secondary | ICD-10-CM | POA: Diagnosis not present

## 2020-05-30 DIAGNOSIS — R319 Hematuria, unspecified: Secondary | ICD-10-CM | POA: Diagnosis not present

## 2020-05-30 LAB — CREATININE, SERUM
Creatinine, Ser: 0.8 mg/dL (ref 0.61–1.24)
GFR calc Af Amer: >60 mL/min (ref >60)
GFR calc non Af Amer: >60 mL/min (ref >60)

## 2020-05-30 IMAGING — CT CT ABD-PEL WO/W CM
2 of 6 series · 13 of 32 positions shown, 18 images · IV contrast (APPLIED)
Comparison: None available

CLINICAL DATA: Hematuria for 2 3 months. Negative urinalysis.
History of benign prostate hypertrophy.

EXAM:
CT ABDOMEN AND PELVIS WITHOUT AND WITH CONTRAST
TECHNIQUE: Multidetector CT imaging of the abdomen and pelvis was performed
following the standard protocol before and following the bolus
administration of intravenous contrast.
CONTRAST:  125mL OMNIPAQUE IOHEXOL 300 MG/ML  SOLN

[Series 2: axial pre · axial · non-contrast · 0.87mm/px · z∈[-1022,-637]mm · 6 of 109 slices shown]
[im 16/109  soft-tissue]
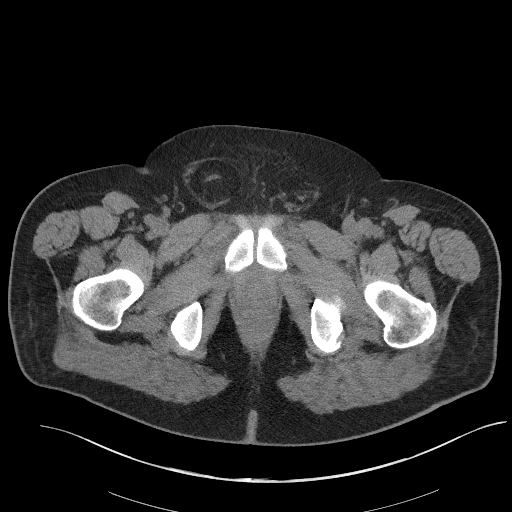
[im 31/109  soft-tissue]
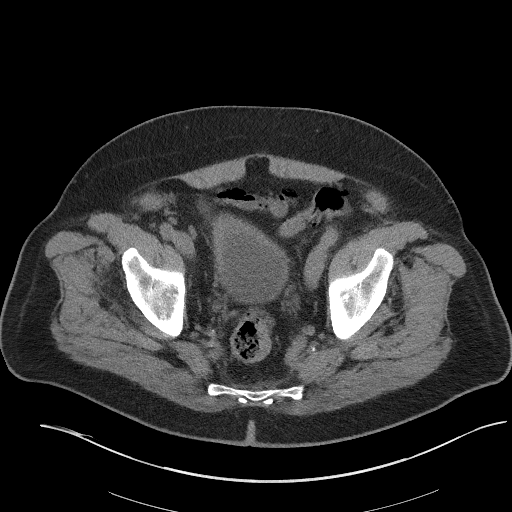
[im 47/109  soft-tissue]
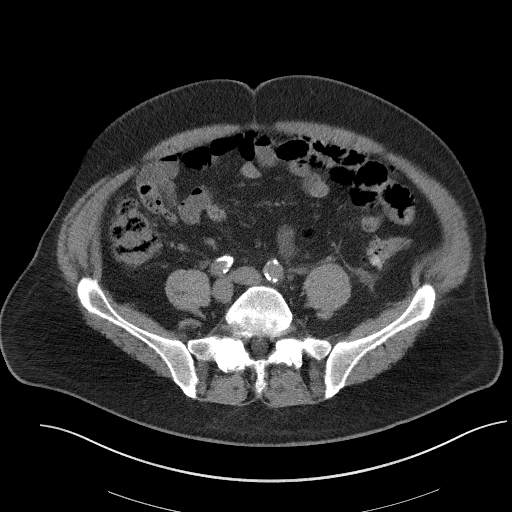
[im 62/109  soft-tissue]
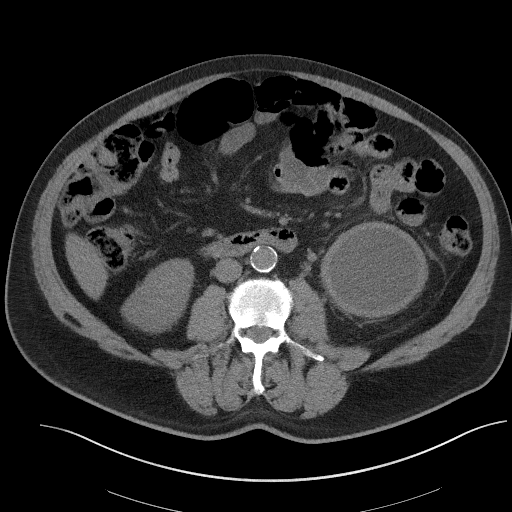
[im 78/109  soft-tissue]
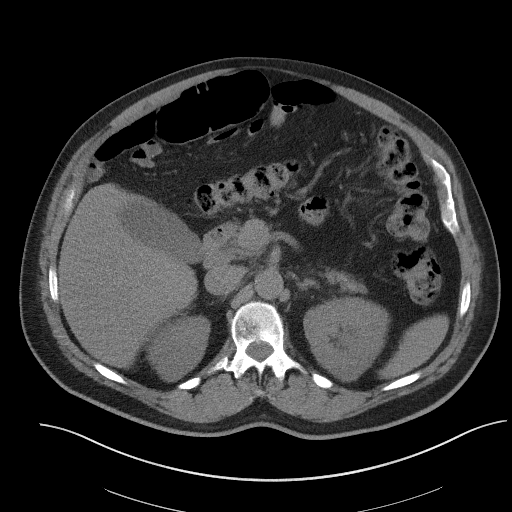
[im 93/109  soft-tissue]
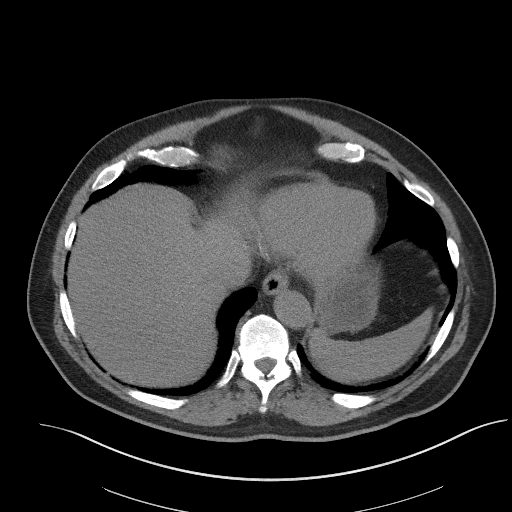

[Series 13: axial delay · axial · delayed · 0.87mm/px · z∈[-956,-531]mm · 7 of 115 slices shown, 12 images]
[im 15/115  soft-tissue]
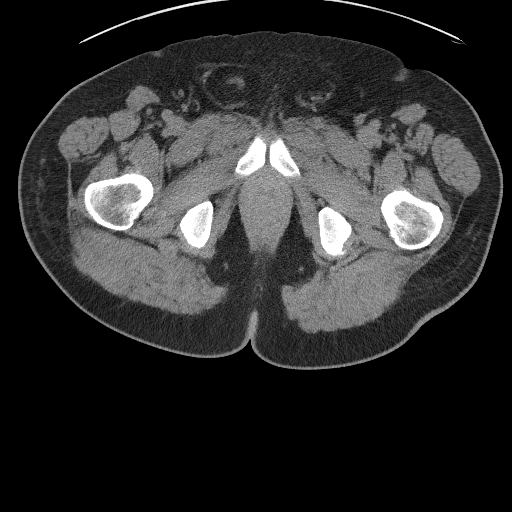
[im 15/115  bone]
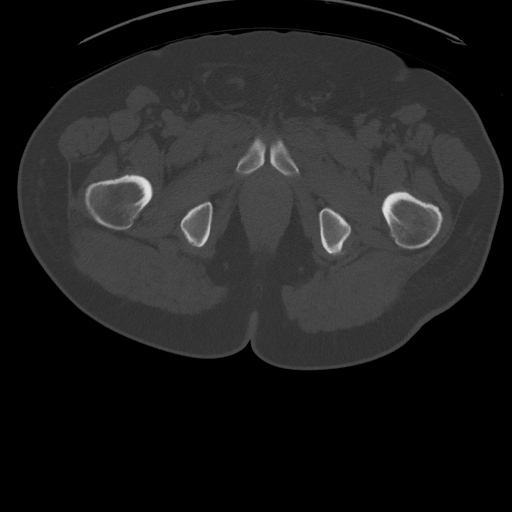
[im 29/115  soft-tissue]
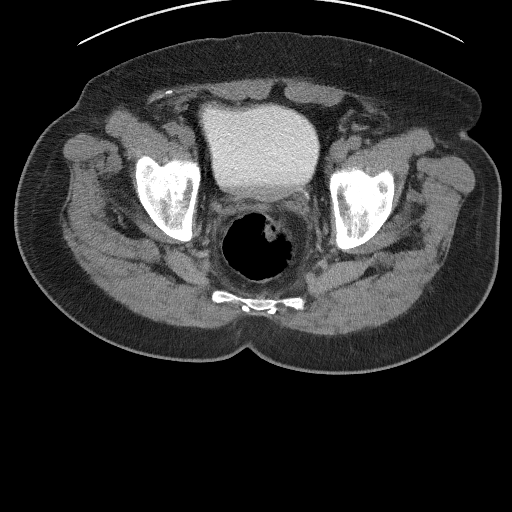
[im 43/115  soft-tissue]
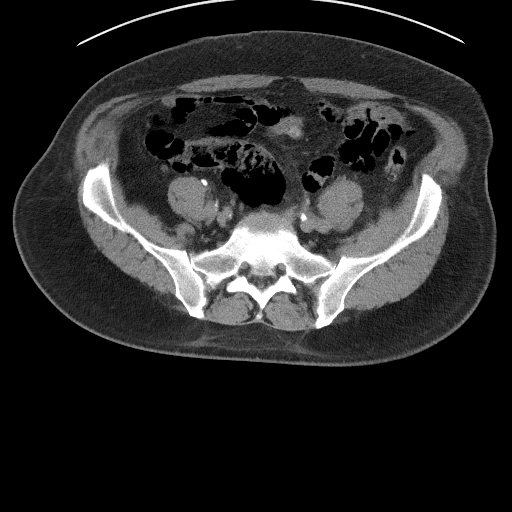
[im 58/115  soft-tissue]
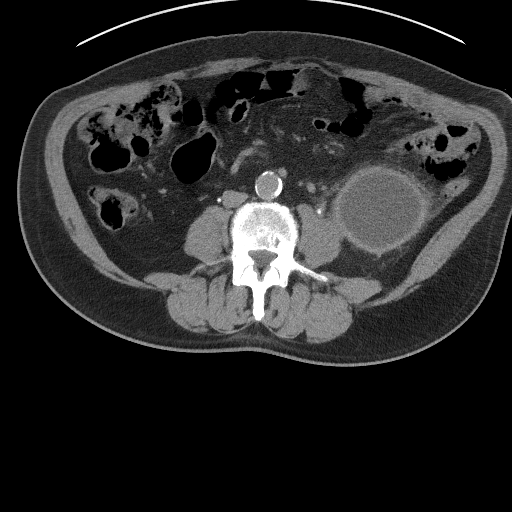
[im 58/115  lung]
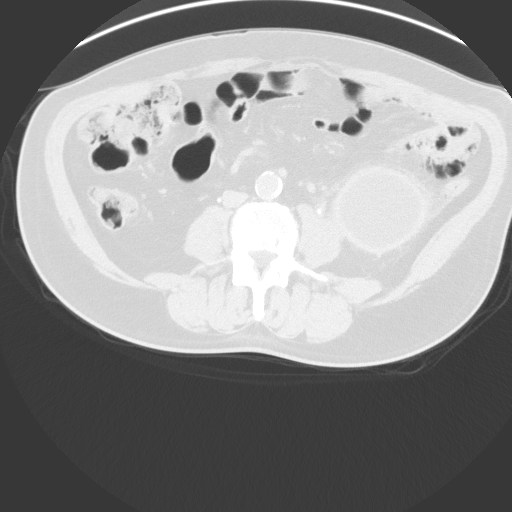
[im 72/115  soft-tissue]
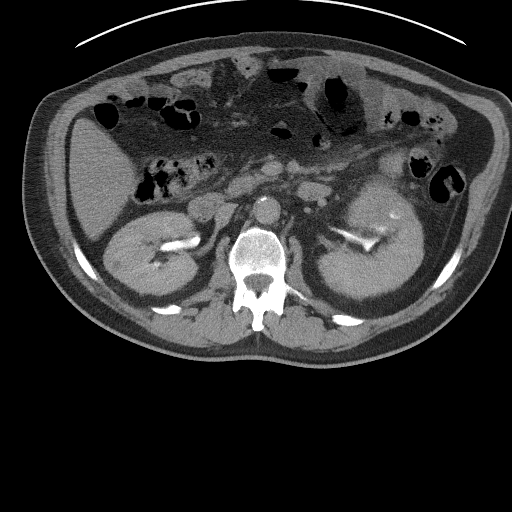
[im 72/115  lung]
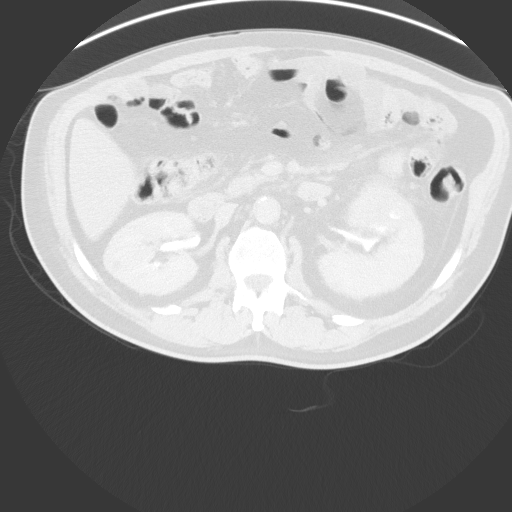
[im 86/115  soft-tissue]
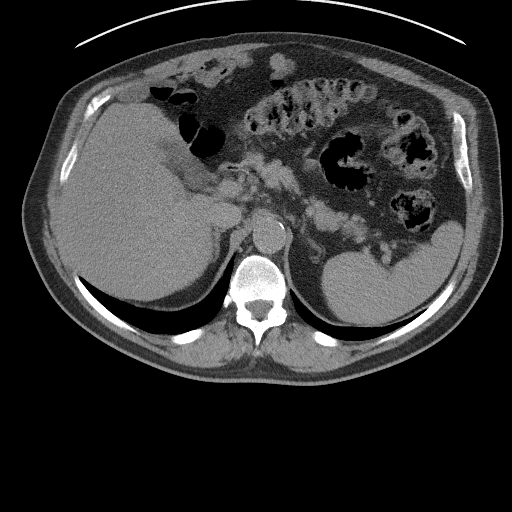
[im 86/115  lung]
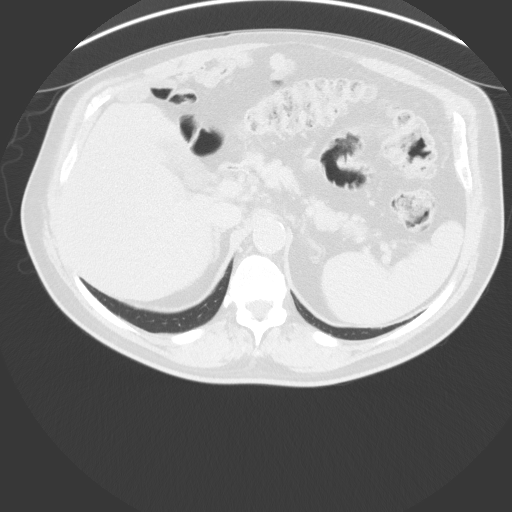
[im 100/115  soft-tissue]
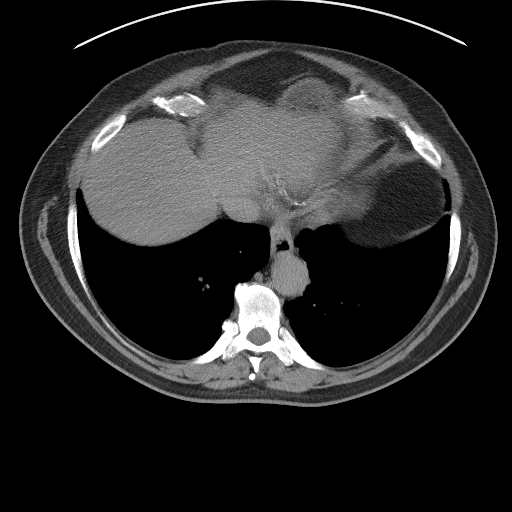
[im 100/115  lung]
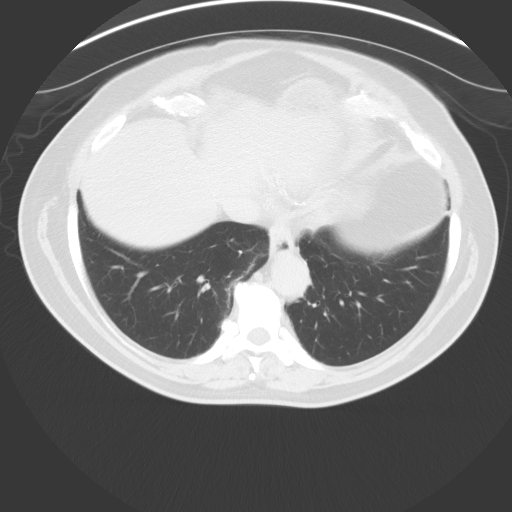

[13 of 32 positions shown; findings below may reference images not displayed]

FINDINGS: Lower chest: Small angular nodule in the RIGHT middle lobe (5 mm,
image 3/series 8) a benign morphology.

Hepatobiliary: No focal hepatic lesion. No biliary duct dilatation.
Common bile duct is normal.

Pancreas: Pancreas is normal. No ductal dilatation. No pancreatic
inflammation.

Spleen: Normal spleen

Adrenals/urinary tract: Adrenal glands normal. Extending from the
lower pole of the LEFT kidney is a large round thick-walled cystic
lesion measuring 8.4 by 8.7 by 9.7 cm. The wall of cystic structure
measures 5 mm. Internally the cyst is homogeneous with simple fluid
attenuation. There is stranding in the perinephric fat adjacent to
the large cystic complex. Small amount fluid along the posterior
LEFT pericolic gutter/pararenal space (image 51/4).

Nonenhancing cyst in the upper pole of the LEFT kidney is contained
within the cortex and measures 1.8 cm .

No hydronephrosis.  Ureters and bladder normal.

Stomach/Bowel: Stomach, small bowel normal.: And rectosigmoid colon
normal.

Vascular/Lymphatic: Abdominal aorta is normal caliber with
atherosclerotic calcification. There is no retroperitoneal or
periportal lymphadenopathy. No pelvic lymphadenopathy.

Reproductive: Prostate normal

Other: Bilateral fat filled inguinal hernias.

Musculoskeletal: No aggressive osseous lesion
IMPRESSION: 1. Large thick-walled cystic complex extending from the lower pole
of the LEFT kidney. Differential includes infected renal cyst versus
cystic renal neoplasm. Favor infectious etiology. Recommend clinical
correlation and consider ultrasound or MRI with contrast for further
evaluation.
2. No hydronephrosis.  Ureters and bladder appear normal.
3. Nonenhancing simple cyst in the upper pole of the LEFT kidney
(Bosniak 1).

These results will be called to the ordering clinician or
representative by the Radiologist Assistant, and communication
documented in the PACS or [REDACTED].

## 2020-05-30 MED ORDER — IOHEXOL 300 MG/ML  SOLN
125.0000 mL | Freq: Once | INTRAMUSCULAR | Status: AC | PRN
  Administered 2020-05-30: 125 mL via INTRAVENOUS

## 2020-05-30 NOTE — Telephone Encounter (Signed)
Incoming triage call from [facility] notifying Dr Apolinar Junes of patients CT; notably marking impression #1 as high priority.

## 2020-05-31 NOTE — Progress Notes (Signed)
06/01/2020  CC:  Chief Complaint  Patient presents with  . Cysto    HPI: Jesse Gardner is a 74 y.o. male with a history of BPH with obstruction/LUTS and gross hematuria returns for a cysto and review of CT hematuria work up. Patient is accompanied by his daughter today.  Patient last saw his PCP on 05/06/2020. He complained of hematuria described as intermittent dark brown urine. He had urgency and frequency. Denied dysuria. He drinks a pot of coffee per day and he likes to drink tea. Patient does not drink soda. Patient was treated with keflex 500 mg TID x 7 days. He was encouraged to push fluids and eat a low sodium diet.   UA showed large blood, protein positive and 3+ leukocytes. Urine culture was negative.   He has a personal history of BPH with obstruction/LUTS. He has history of reduced urinary stream. Not on any BPH mediation.  Most recent PSA a year ago 1.8 on 12/2018.  CT hematuria workup on 05/30/2020 showed large thick-walled cystic complex extending from the lower pole of the LEFT kidney. Differential included infected renal cyst versus cystic renal neoplasm. Favor infectious etiology. No hydronephrosis.  Ureters and bladder appeared normal. Nonenhancing simple cyst in the upper pole of the LEFT kidney (Bosniak 1).   The patient reports that he feels fine. He denies any pain today. No fevers.  No flank pain.  Patient is not on any blood thinners.   Former smoker. He is trying to cut back and quit smoking.   Blood pressure (!) 148/82, pulse (!) 101. NED. A&Ox3.   No respiratory distress   Abd soft, NT, ND Normal phallus with bilateral descended testicles  Cystoscopy Procedure Note  Patient identification was confirmed, informed consent was obtained, and patient was prepped using Betadine solution.  Lidocaine jelly was administered per urethral meatus.     Pre-Procedure: - Inspection reveals a normal caliber ureteral meatus.  Procedure: The flexible  cystoscope was introduced without difficulty - No urethral strictures/lesions are present. - Normal prostate  - Normal bladder neck - Bilateral ureteral orifices identified - Bladder mucosa  reveals no ulcers, tumors, or lesions - No bladder stones - No trabeculation  Retroflexion is unremarkable.   Post-Procedure: - Patient tolerated the procedure well  Assessment/ Plan:  1. Gross hematuria High risk due to smoking history. Cystoscopy is unremarkable.  Likely secondary to #2  2. Renal abscess, left Suspected to be a contributing factor of hematuria. Interesting, the patient is asymptomatic without flank pain or signs of systemic infection which is somewhat unusual We discussed the differential diagnosis could include a possible underlying malignancy but based on the appearance, this favors an infectious etiology Will schedule patient for percutaneous drainage of abscess with interventional radiology. Drain will be left in place and patient will return in 1 week to reassess drain removal. Patient is advised to keep a drainage log. We will plan for culture of the aspirated fluid as well as cytology He will need follow-up imaging to rule out underlying mass down the road He understands all this and is agreeable to plan Plan for percutaneous intervention tomorrow -Start Keflex 500 mg TID x 14 days.   3. Pyuria Patient has no symptoms other than baseline  4. BPH w/ obstruction/LUTS He is not on any medication currently, will consider pharmaceutical treatment vs intervention in the near future. Recent PSA 1.8, 12/2018.    Georges Mouse, am acting as a scribe for Dr. Vanna Scotland.  I  have reviewed the above documentation for accuracy and completeness, and I agree with the above.   Vanna Scotland, MD  I spent 30 total minutes on the day of the encounter including pre-visit review of the medical record, face-to-face time with the patient, and post visit ordering of  labs/imaging/tests.  Patient was been seen and evaluated today for separate diagnosis, left renal abscess in addition to cystoscopy.  These are 2 separate issues.  The above 30-minute time was spent in planning and evaluation and treatment of specifically his left renal abscess.

## 2020-06-01 ENCOUNTER — Encounter: Payer: Self-pay | Admitting: Urology

## 2020-06-01 ENCOUNTER — Other Ambulatory Visit: Payer: Self-pay | Admitting: Radiology

## 2020-06-01 ENCOUNTER — Ambulatory Visit (INDEPENDENT_AMBULATORY_CARE_PROVIDER_SITE_OTHER): Payer: Medicare Other | Admitting: Urology

## 2020-06-01 ENCOUNTER — Other Ambulatory Visit: Payer: Self-pay

## 2020-06-01 ENCOUNTER — Other Ambulatory Visit: Payer: Self-pay | Admitting: Student

## 2020-06-01 VITALS — BP 148/82 | HR 101

## 2020-06-01 DIAGNOSIS — R310 Gross hematuria: Secondary | ICD-10-CM

## 2020-06-01 DIAGNOSIS — N151 Renal and perinephric abscess: Secondary | ICD-10-CM

## 2020-06-01 MED ORDER — CEPHALEXIN 500 MG PO CAPS: 500.0000 mg | ORAL_CAPSULE | Freq: Three times a day (TID) | ORAL | 0 refills | Status: DC

## 2020-06-02 ENCOUNTER — Ambulatory Visit
Admission: RE | Admit: 2020-06-02 | Discharge: 2020-06-02 | Disposition: A | Discharge status: Home / Self Care | Payer: Medicare Other | Source: Ambulatory Visit | Attending: Urology | Admitting: Urology

## 2020-06-02 ENCOUNTER — Other Ambulatory Visit: Payer: Self-pay

## 2020-06-02 DIAGNOSIS — Z79899 Other long term (current) drug therapy: Secondary | ICD-10-CM | POA: Diagnosis not present

## 2020-06-02 DIAGNOSIS — N151 Renal and perinephric abscess: Secondary | ICD-10-CM | POA: Diagnosis not present

## 2020-06-02 DIAGNOSIS — I10 Essential (primary) hypertension: Secondary | ICD-10-CM | POA: Insufficient documentation

## 2020-06-02 DIAGNOSIS — F17210 Nicotine dependence, cigarettes, uncomplicated: Secondary | ICD-10-CM | POA: Insufficient documentation

## 2020-06-02 LAB — CBC
HCT: 37 % — ABNORMAL LOW (ref 39.0–52.0)
Hemoglobin: 11.7 g/dL — ABNORMAL LOW (ref 13.0–17.0)
MCH: 27.8 pg (ref 26.0–34.0)
MCHC: 31.6 g/dL (ref 30.0–36.0)
MCV: 87.9 fL (ref 80.0–100.0)
Platelets: 249 10*3/uL (ref 150–400)
RBC: 4.21 MIL/uL — ABNORMAL LOW (ref 4.22–5.81)
RDW: 14.9 % (ref 11.5–15.5)
WBC: 9.9 10*3/uL (ref 4.0–10.5)
nRBC: 0 % (ref 0.0–0.2)

## 2020-06-02 IMAGING — CT CT IMAGE GUIDED DRAINAGE BY PERCUTANEOUS CATHETER
1 of 3 series · 13 of 32 positions shown, 18 images · non-contrast
Comparison: none

CLINICAL DATA: Thick walled left lower pole renal fluid collection.

[Series 2: i-spiral 5.0 b30f · axial · 0.96mm/px · z∈[-254,-104]mm · 13 of 51 slices shown, 18 images]
[im 4/51  soft-tissue]
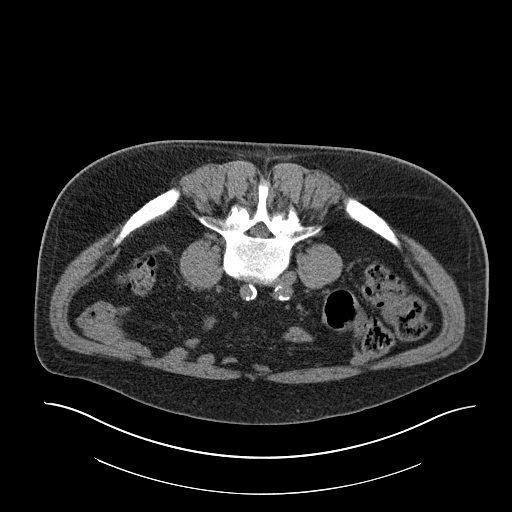
[im 4/51  bone]
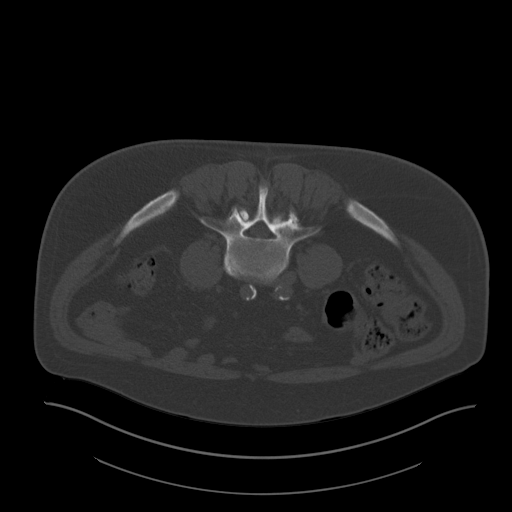
[im 7/51  soft-tissue]
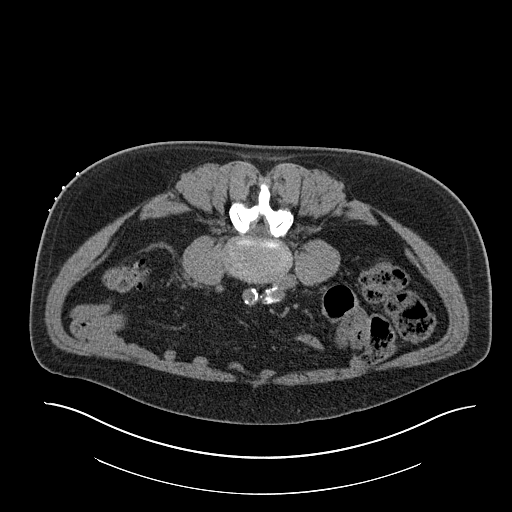
[im 13/51  soft-tissue]
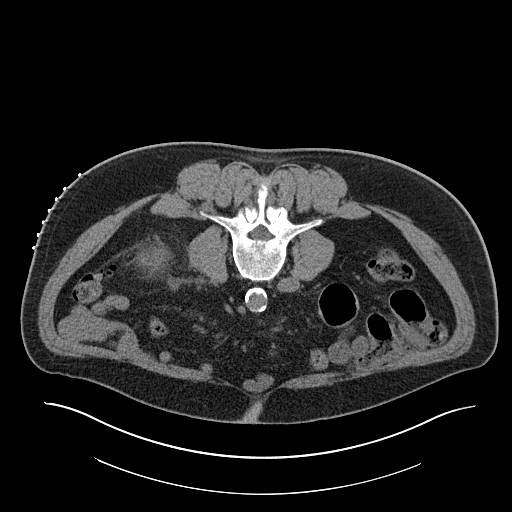
[im 16/51  soft-tissue]
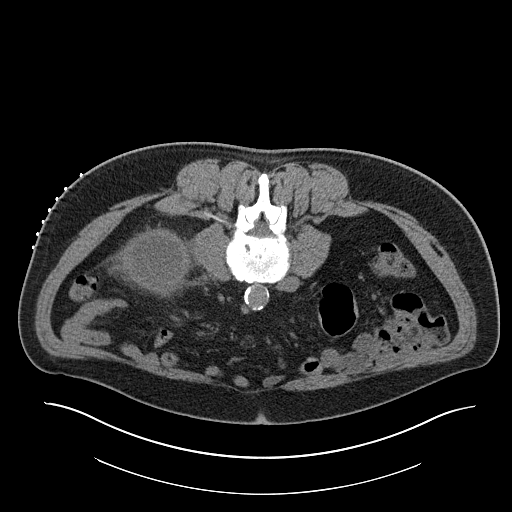
[im 19/51  soft-tissue]
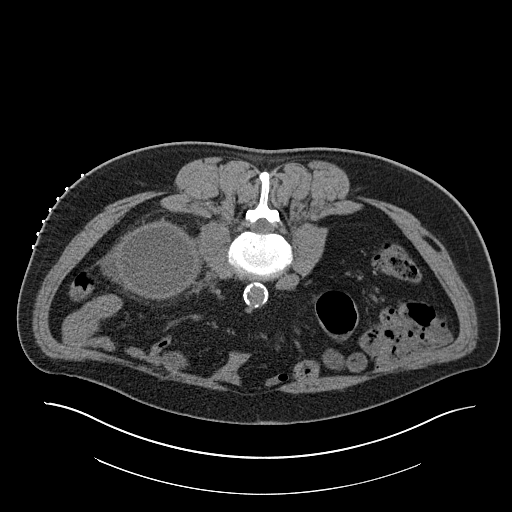
[im 22/51  soft-tissue]
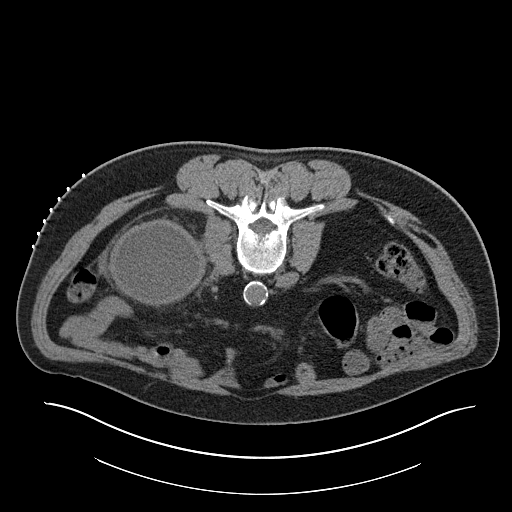
[im 29/51  soft-tissue]
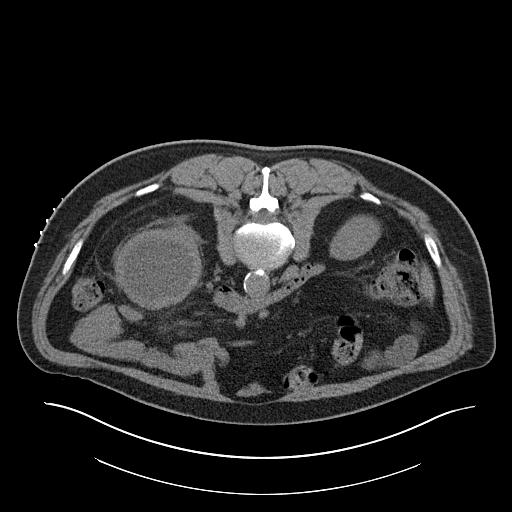
[im 32/51  soft-tissue]
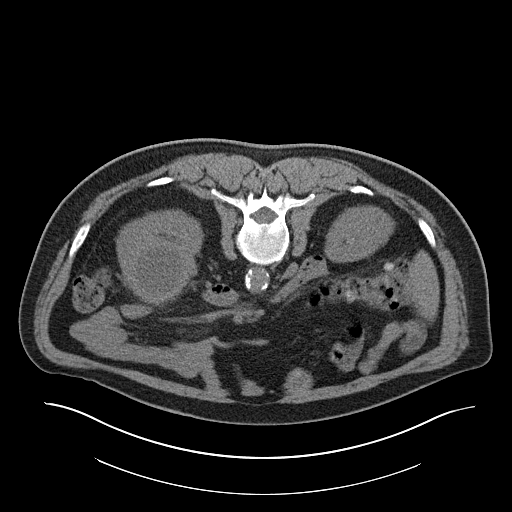
[im 35/51  soft-tissue]
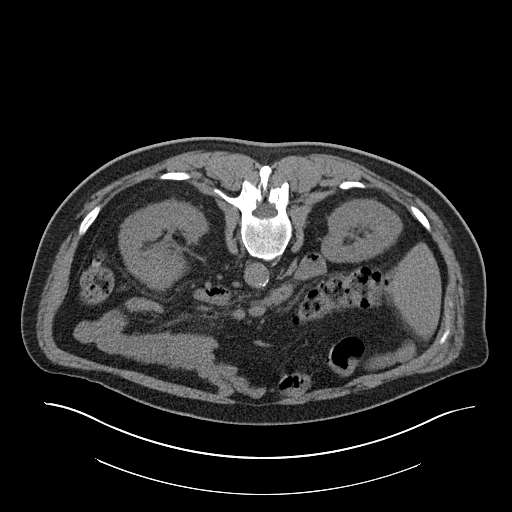
[im 35/51  bone]
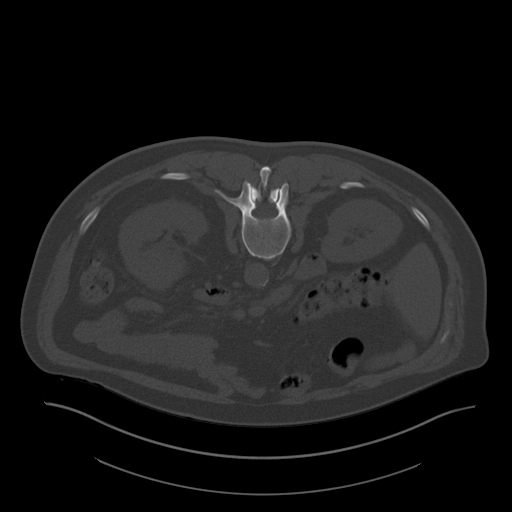
[im 38/51  soft-tissue]
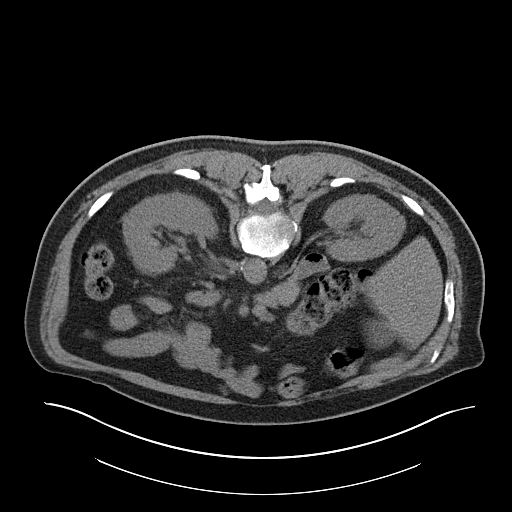
[im 38/51  lung]
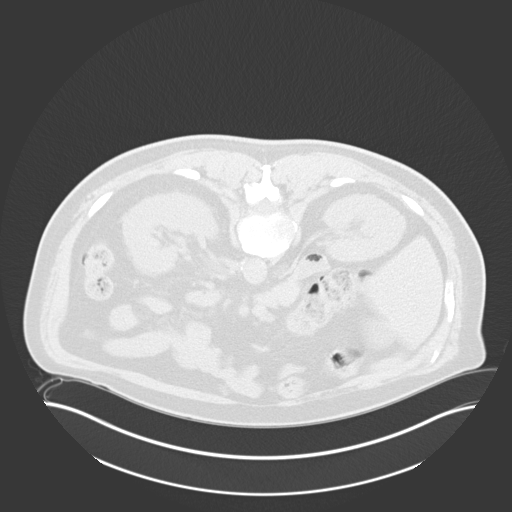
[im 41/51  lung]
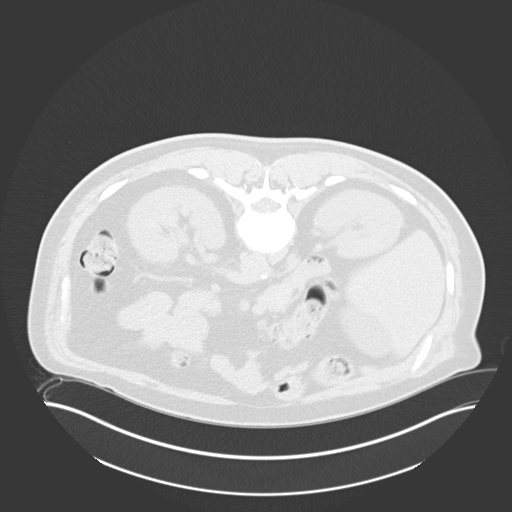
[im 44/51  soft-tissue]
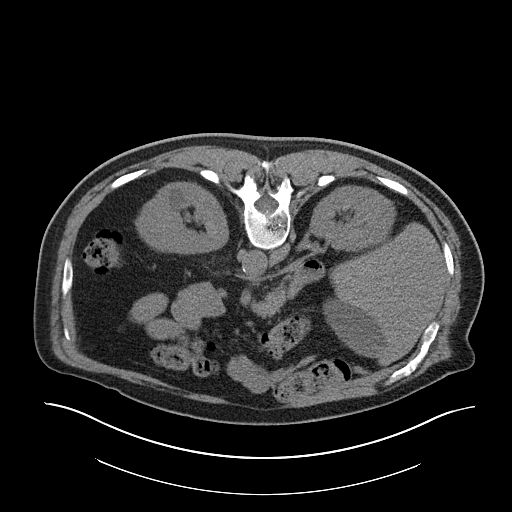
[im 44/51  lung]
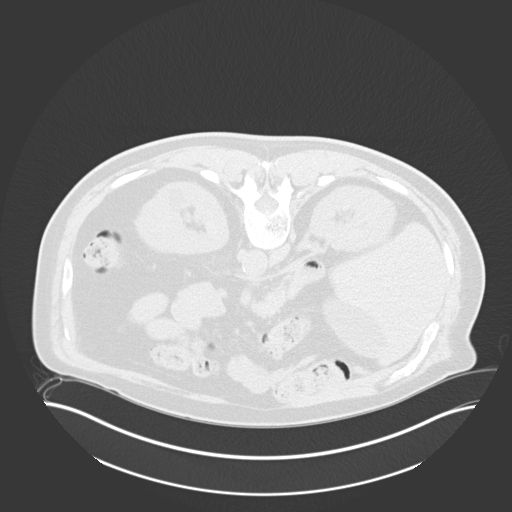
[im 47/51  soft-tissue]
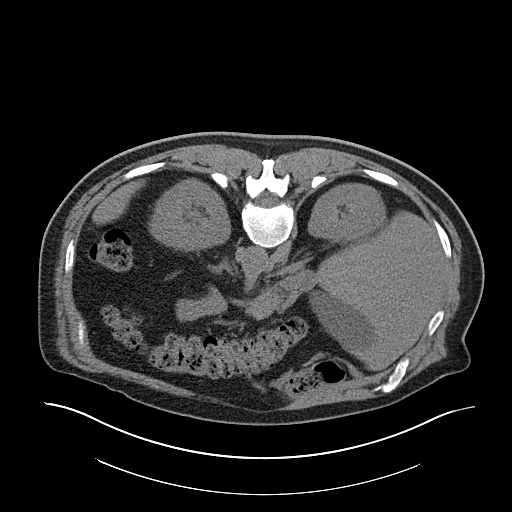
[im 47/51  lung]
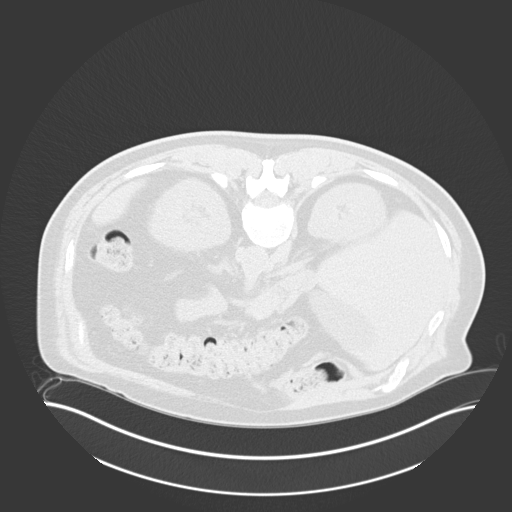

[13 of 32 positions shown; findings below may reference images not displayed]

EXAM:
CT GUIDED CATHETER DRAINAGE OF LEFT RENAL ABSCESS

ANESTHESIA/SEDATION:
1.0 mg IV Versed 50 mcg IV Fentanyl

Total Moderate Sedation Time:  20 minutes

The patient's level of consciousness and physiologic status were
continuously monitored during the procedure by Radiology nursing.

PROCEDURE:
The procedure, risks, benefits, and alternatives were explained to
the patient. Questions regarding the procedure were encouraged and
answered. The patient understands and consents to the procedure. A
time out was performed prior to initiating the procedure.

CT was performed in a prone position. The left lateral flank region
was prepped with chlorhexidine in a sterile fashion, and a sterile
drape was applied covering the operative field. A sterile gown and
sterile gloves were used for the procedure. Local anesthesia was
provided with 1% Lidocaine.

Under CT guidance an 18 gauge trocar needle was advanced into a
focal fluid collection emanating from the lower pole of the left
kidney. After confirming needle tip position, aspiration of fluid
was performed. A guidewire was advanced into the collection. The
tract was dilated and a 10 French percutaneous drainage catheter
advanced. Catheter position was confirmed by CT after placement.
Additional fluid was aspirated from the drain. Fluid samples were
sent for culture analysis as well as cytology. The drain was
connected to a suction bulb. It was secured at the skin with a
Prolene retention suture and StatLock device.

COMPLICATIONS:
None
FINDINGS: Thick-walled rounded cystic abnormality again is noted emanating
from the anterior lower pole of the left kidney. On axial imaging
maximum transverse diameter is approximately 9 cm. Aspiration
yielded thick, turbid dark brown colored fluid. After drain
placement there is excellent return of fluid with CT demonstrating
partial collapse of the cystic abnormality.
IMPRESSION: CT-guided percutaneous catheter drainage of cystic abnormality
emanating from the lower pole of the left kidney. The thick wall
collection yielded turbid dark brown fluid. Fluid samples were sent
for culture analysis and cytology.

## 2020-06-02 MED ORDER — FENTANYL CITRATE (PF) 100 MCG/2ML IJ SOLN
INTRAMUSCULAR | Status: DC | PRN
  Administered 2020-06-02: 50 ug via INTRAVENOUS

## 2020-06-02 MED ORDER — SODIUM CHLORIDE 0.9 % IV SOLN: INTRAVENOUS | Status: DC

## 2020-06-02 MED ORDER — FENTANYL CITRATE (PF) 100 MCG/2ML IJ SOLN
INTRAMUSCULAR | Status: AC
  Filled 2020-06-02: qty 2

## 2020-06-02 MED ORDER — MIDAZOLAM HCL 2 MG/2ML IJ SOLN
INTRAMUSCULAR | Status: AC
  Filled 2020-06-02: qty 2

## 2020-06-02 MED ORDER — SODIUM CHLORIDE 0.9% FLUSH: 5.0000 mL | Freq: Three times a day (TID) | INTRAVENOUS | Status: DC

## 2020-06-02 MED ORDER — MIDAZOLAM HCL 2 MG/2ML IJ SOLN
INTRAMUSCULAR | Status: DC | PRN
  Administered 2020-06-02: 1 mg via INTRAVENOUS

## 2020-06-02 NOTE — Progress Notes (Signed)
Patient clinically stable post Drain placement per Dr Fredia Sorrow, tolerated well. Vitals have remained stable pre and post procedure. No fever, denies complaints of pain,received Versed 1mg  along Fentanyl IV for procedure. Daughter here with discharge instructions and care  Of drain, given per protocol with questions answered. Stated understanding,supplies for home care,

## 2020-06-02 NOTE — Discharge Instructions (Signed)
Percutaneous Abscess Drain, Care After This sheet gives you information about how to care for yourself after your procedure. Your health care provider may also give you more specific instructions. If you have problems or questions, contact your health care provider. What can I expect after the procedure? After your procedure, it is common to have:  A small amount of bruising and discomfort in the area where the drainage tube (catheter) was placed.  Sleepiness and fatigue. This should go away after the medicines you were given have worn off. Follow these instructions at home: Incision care  Follow instructions from your health care provider about how to take care of your incision. Make sure you: ? Wash your hands with soap and water before you change your bandage (dressing). If soap and water are not available, use hand sanitizer. ? Change your dressing as told by your health care provider. ? Leave stitches (sutures), skin glue, or adhesive strips in place. These skin closures may need to stay in place for 2 weeks or longer. If adhesive strip edges start to loosen and curl up, you may trim the loose edges. Do not remove adhesive strips completely unless your health care provider tells you to do that.  Check your incision area every day for signs of infection. Check for: ? More redness, swelling, or pain. ? More fluid or blood. ? Warmth. ? Pus or a bad smell. ? Fluid leaking from around your catheter (instead of fluid draining through your catheter). Catheter care   Follow instructions from your health care provider about emptying and cleaning your catheter and collection bag. You may need to clean the catheter every day so it does not clog.  If directed, write down the following information every time you empty your bag: ? The date and time. ? The amount of drainage. General instructions  Rest at home for 1-2 days after your procedure. Return to your normal activities as told by your  health care provider.  Do not take baths, swim, or use a hot tub for 24 hours after your procedure, or until your health care provider says that this is okay.  Take over-the-counter and prescription medicines only as told by your health care provider.  Keep all follow-up visits as told by your health care provider. This is important. Contact a health care provider if:  You have less than 10 mL of drainage a day for 2-3 days in a row, or as directed by your health care provider.  You have more redness, swelling, or pain around your incision area.  You have more fluid or blood coming from your incision area.  Your incision area feels warm to the touch.  You have pus or a bad smell coming from your incision area.  You have fluid leaking from around your catheter (instead of through your catheter).  You have a fever or chills.  You have pain that does not get better with medicine. Get help right away if:  Your catheter comes out.  You suddenly stop having drainage from your catheter.  You suddenly have blood in the fluid that is draining from your catheter.  You become dizzy or you faint.  You develop a rash.  You have nausea or vomiting.  You have difficulty breathing or you feel short of breath.  You develop chest pain.  You have problems with your speech or vision.  You have trouble balancing or moving your arms or legs. Summary  It is common to have a small   amount of bruising and discomfort in the area where the drainage tube (catheter) was placed.  You may be directed to record the amount of drainage from the bag every time you empty it.  Follow instructions from your health care provider about emptying and cleaning your catheter and collection bag. This information is not intended to replace advice given to you by your health care provider. Make sure you discuss any questions you have with your health care provider. Document Revised: 09/27/2017 Document  Reviewed: 09/06/2016 Elsevier Patient Education  2020 [employer]  

## 2020-06-02 NOTE — H&P (Signed)
Chief Complaint: Patient was seen in consultation today for drainage of left renal fluid collection at the request of Brandon,Ashley  Referring Physician(s): Brandon,Ashley  Patient Status: [facility] - Out-pt  History of Present Illness: Jesse Gardner is a 74 y.o. male referred by Dr. Apolinar Junes after CT demonstrated a large, 9 cm thick-walled cystic fluid collection of the lower left kidney. Has history of BPH/LUTS and hematuria/brown urine. UA showed large blood, protein positive and 3+ leukocytes. Urine culture was negative. Denies dysuria, pain, fever or chills.  Past Medical History:  Diagnosis Date  . Hypertension     Past Surgical History:  Procedure Laterality Date  . APPENDECTOMY    . HERNIA REPAIR      Allergies: Patient has no known allergies.  Medications: Prior to Admission medications   Medication Sig Start Date End Date Taking? Authorizing Provider  cephALEXin (KEFLEX) 500 MG capsule Take 1 capsule (500 mg total) by mouth 3 (three) times daily. 06/01/20  Yes Vanna Scotland, MD  fluticasone [facility]) 50 MCG/ACT nasal spray Place 2 sprays into both nostrils daily. Use for 4-6 weeks then stop and use seasonally or as needed. 10/24/18  Yes Karamalegos, Netta Neat, DO  losartan (COZAAR) 50 MG tablet Take 1 tablet (50 mg total) by mouth daily. 05/06/20  Yes Karamalegos, Netta Neat, DO  Melatonin 5 MG CAPS Take by mouth.   Yes [provider]  Misc Natural Products (OSTEO BI-FLEX TRIPLE STRENGTH PO) Take by mouth.   Yes [provider]  naproxen sodium (ALEVE) 220 MG tablet Take 440 mg by mouth daily as needed.   Yes [provider]     Family History  Problem Relation Age of Onset  . Alzheimer's disease Mother   . Diabetes Father   . Prostate cancer Brother     Social History   Socioeconomic History  . Marital status: Significant Other    Spouse name: Not on file  . Number of children: Not on file  . Years of education: Not on file    . Highest education level: Not on file  Occupational History  . Not on file  Tobacco Use  . Smoking status: Current Some Day Smoker    Packs/day: 0.25    Years: 57.00    Pack years: 14.25    Types: Cigarettes  . Smokeless tobacco: Current User  . Tobacco comment: 1 cigarette in the last month   Vaping Use  . Vaping Use: Never used  Substance and Sexual Activity  . Alcohol use: Yes    Alcohol/week: 1.0 standard drink    Types: 1 Glasses of wine per week  . Drug use: Never  . Sexual activity: Not on file  Other Topics Concern  . Not on file  Social History Narrative  . Not on file   Social Determinants of Health   Financial Resource Strain: Low Risk   . Difficulty of Paying Living Expenses: Not hard at all  Food Insecurity: No Food Insecurity  . Worried About Programme researcher, broadcasting/film/video in the Last Year: Never true  . Ran Out of Food in the Last Year: Never true  Transportation Needs: No Transportation Needs  . Lack of Transportation (Medical): No  . Lack of Transportation (Non-Medical): No  Physical Activity: Inactive  . Days of Exercise per Week: 0 days  . Minutes of Exercise per Session: 0 min  Stress: Stress Concern Present  . Feeling of Stress : To some extent  Social Connections: Moderately Isolated  .  Frequency of Communication with Friends and Family: Three times a week  . Frequency of Social Gatherings with Friends and Family: Three times a week  . Attends Religious Services: Never  . Active Member of Clubs or Organizations: No  . Attends Banker Meetings: Never  . Marital Status: Living with partner    Review of Systems: A 12 point ROS discussed and pertinent positives are indicated in the HPI above.  All other systems are negative.  Review of Systems  Constitutional: Negative.   Respiratory: Negative.   Cardiovascular: Negative.   Gastrointestinal: Negative.   Genitourinary: Positive for frequency, hematuria and urgency. Negative for dysuria and  flank pain.  Musculoskeletal: Negative.   Neurological: Negative.     Vital Signs: BP (!) 145/92   Pulse 87   Temp 98.2 F (36.8 C) (Oral)   Resp 20   Ht 6\' 4"  (1.93 m)   Wt 118.8 kg   SpO2 94%   BMI 31.89 kg/m   Physical Exam Vitals reviewed.  Constitutional:      General: He is not in acute distress.    Appearance: Normal appearance. He is not toxic-appearing.  Cardiovascular:     Rate and Rhythm: Normal rate and regular rhythm.     Heart sounds: Normal heart sounds. No murmur heard.  No friction rub. No gallop.   Pulmonary:     Effort: Pulmonary effort is normal. No respiratory distress.     Breath sounds: Normal breath sounds. No stridor. No wheezing, rhonchi or rales.  Abdominal:     Palpations: Abdomen is soft. There is no mass.     Tenderness: There is no abdominal tenderness. There is no guarding or rebound.  Musculoskeletal:        General: No swelling.  Skin:    General: Skin is warm and dry.  Neurological:     General: No focal deficit present.     Mental Status: He is alert and oriented to person, place, and time.     Imaging: CT HEMATURIA WORKUP  Result Date: 05/30/2020 CLINICAL DATA:  Hematuria for 2 3 months. Negative urinalysis. History of benign prostate hypertrophy. EXAM: CT ABDOMEN AND PELVIS WITHOUT AND WITH CONTRAST TECHNIQUE: Multidetector CT imaging of the abdomen and pelvis was performed following the standard protocol before and following the bolus administration of intravenous contrast. CONTRAST:  07/30/2020 OMNIPAQUE IOHEXOL 300 MG/ML  SOLN COMPARISON:  None available FINDINGS: Lower chest: Small angular nodule in the RIGHT middle lobe (5 mm, image 3/series 8) a benign morphology. Hepatobiliary: No focal hepatic lesion. No biliary duct dilatation. Common bile duct is normal. Pancreas: Pancreas is normal. No ductal dilatation. No pancreatic inflammation. Spleen: Normal spleen Adrenals/urinary tract: Adrenal glands normal. Extending from the lower pole  of the LEFT kidney is a large round thick-walled cystic lesion measuring 8.4 by 8.7 by 9.7 cm. The wall of cystic structure measures 5 mm. Internally the cyst is homogeneous with simple fluid attenuation. There is stranding in the perinephric fat adjacent to the large cystic complex. Small amount fluid along the posterior LEFT pericolic gutter/pararenal space (image 51/4). Nonenhancing cyst in the upper pole of the LEFT kidney is contained within the cortex and measures 1.8 cm . No hydronephrosis.  Ureters and bladder normal. Stomach/Bowel: Stomach, small bowel normal.: And rectosigmoid colon normal. Vascular/Lymphatic: Abdominal aorta is normal caliber with atherosclerotic calcification. There is no retroperitoneal or periportal lymphadenopathy. No pelvic lymphadenopathy. Reproductive: Prostate normal Other: Bilateral fat filled inguinal hernias. Musculoskeletal: No aggressive osseous  lesion IMPRESSION: 1. Large thick-walled cystic complex extending from the lower pole of the LEFT kidney. Differential includes infected renal cyst versus cystic renal neoplasm. Favor infectious etiology. Recommend clinical correlation and consider ultrasound or MRI with contrast for further evaluation. 2. No hydronephrosis.  Ureters and bladder appear normal. 3. Nonenhancing simple cyst in the upper pole of the LEFT kidney (Bosniak 1). These results will be called to the ordering clinician or representative by the Radiologist Assistant, and communication documented in the PACS or [employer]. Electronically Signed   By: Genevive Bi M.D.   On: 05/30/2020 14:59    Labs:  CBC: Recent Labs    06/02/20 1304  WBC 9.9  HGB 11.7*  HCT 37.0*  PLT 249    COAGS: No results for input(s): INR, APTT in the last 8760 hours.  BMP: Recent Labs    06/29/19 0802 05/30/20 1017  NA 141  --   K 4.4  --   CL 104  --   CO2 30  --   GLUCOSE 106*  --   BUN 19  --   CALCIUM 9.2  --   CREATININE 0.82 0.80  GFRNONAA 88  >60  GFRAA 102 >60    Assessment and Plan:  For CT guided drainage of left lower pole cystic fluid collection of left kidney demonstrating thick wall by CT. Plan is to leave pigtail drain in place. Will plan to send fluid for culture and cytology.  Thank you for this interesting consult.  I greatly enjoyed meeting Yuniel Blaney and look forward to participating in their care.  A copy of this report was sent to the requesting provider on this date.  Electronically Signed: Reola Calkins, MD 06/02/2020, 1:48 PM     I spent a total of 30 Minutes in face to face in clinical consultation, greater than 50% of which was counseling/coordinating care for left renal fluid collection.

## 2020-06-02 NOTE — Procedures (Signed)
Interventional Radiology Procedure Note  Procedure: CT Guided Drainage of left renal abscess/fluid collection  Complications: None  Estimated Blood Loss: < 10 mL  Findings: 10 Fr drain placed in left renal fluid collection with return of thick, brown fluid. Fluid sample sent for culture analysis and cytology. Drain attached to suction bulb drainage.  Will follow.  Jodi Marble. Fredia Sorrow, M.D Pager:  [phone]

## 2020-06-03 ENCOUNTER — Telehealth: Payer: Self-pay

## 2020-06-03 LAB — MICROSCOPIC EXAMINATION: WBC, UA: >30 /hpf — AB (ref 0–5)

## 2020-06-03 LAB — URINALYSIS, COMPLETE
Bilirubin, UA: NEGATIVE
Glucose, UA: NEGATIVE
Ketones, UA: NEGATIVE
Nitrite, UA: NEGATIVE
Specific Gravity, UA: 1.015 (ref 1.005–1.030)
Urobilinogen, Ur: 0.2 mg/dL (ref 0.2–1.0)
pH, UA: 6 (ref 5.0–7.5)

## 2020-06-03 NOTE — Telephone Encounter (Signed)
Patient notified and scheduled 

## 2020-06-03 NOTE — Telephone Encounter (Signed)
-----   Message from Vanna Scotland, MD sent at 06/03/2020  3:20 PM EDT ----- I asked Jesse Gardner to make his follow-up in 1 week from his tube placement it looks like it is booked out for 2 weeks.  Can you have him come in on Wednesday?

## 2020-06-06 LAB — CYTOLOGY - NON PAP

## 2020-06-07 NOTE — Progress Notes (Signed)
06/08/2020 9:55 AM   Jesse Gardner [birth year] [id number]  Referring provider: Smitty Cords, DO [address] [place],  [place] [address] Chief Complaint  Patient presents with  . Follow-up    HPI: Jesse Gardner is a 74 y.o. male with a history of BPH with obstruction/LUTS and gross hematuria returns for a 1 week follow up post possible left renal abscess drainage.    Patient last saw his PCP on 05/06/2020. He complained of hematuria described as intermittent dark brown urine. He had urgency and frequency. Denied dysuria. He drinks a pot of coffee per day and he likes to drink tea. Patient does not drink soda. Patient was treated with keflex 500 mg TID x 7 days. He was encouraged to push fluids and eat a low sodium diet.   UA showed large blood, protein positive and 3+ leukocytes. Urine culture was negative.   He has a personal history ofBPHwith obstruction/LUTS. He has history of reduced urinary stream.Not on any BPH mediation.  CT hematuria workup on 05/30/2020 showed large thick-walled cystic complex extending from the lower pole of the LEFT kidney. Differential included infected renal cyst versus cystic renal neoplasm. Favor infectious etiology. No hydronephrosis. Ureters and bladder appeared normal. Nonenhancing simple cyst in the upper pole of the LEFT kidney (Bosniak 1).   Patient underwent CT guided drainage of left renal abscess/fluid collection with Dr. Fredia Sorrow on 06/02/2020. Cytology-Non PAP on 06/02/2020 showed predominantly neutrophils, with macrophages and RBCs. Negative for malignancy.  Culture grew BACTEROIDES UNIFORMIS, beta lactamase positive.    Currently on Keflex.    Cystoscopy last week was unremarkable.  No bladder pathology.  He denies fever. No hematuria. No flank pain, tolerating drain well.  Outputs initially 45+ cc, taper off to 20 mL daily.     PMH: Past Medical History:  Diagnosis Date  . Hypertension     Surgical History: Past Surgical  History:  Procedure Laterality Date  . APPENDECTOMY    . HERNIA REPAIR      Home Medications:  Allergies as of 06/08/2020   No Known Allergies     Medication List       Accurate as of June 08, 2020  9:55 AM. If you have any questions, ask your nurse or doctor.        cephALEXin 500 MG capsule Commonly known as: Keflex Take 1 capsule (500 mg total) by mouth 3 (three) times daily.   fluticasone 50 MCG/ACT nasal spray Commonly known as: FLONASE Place 2 sprays into both nostrils daily. Use for 4-6 weeks then stop and use seasonally or as needed.   losartan 50 MG tablet Commonly known as: COZAAR Take 1 tablet (50 mg total) by mouth daily.   Melatonin 5 MG Caps Take by mouth.   naproxen sodium 220 MG tablet Commonly known as: ALEVE Take 440 mg by mouth daily as needed.   OSTEO BI-FLEX TRIPLE STRENGTH PO Take by mouth.       Allergies: No Known Allergies  Family History: Family History  Problem Relation Age of Onset  . Alzheimer's disease Mother   . Diabetes Father   . Prostate cancer Brother     Social History:  reports that he has been smoking cigarettes. He has a 14.25 pack-year smoking history. He uses smokeless tobacco. He reports current alcohol use of about 1.0 standard drink of alcohol per week. He reports that he does not use drugs.   Physical Exam: BP 138/80   Pulse 93   Constitutional:  Alert and oriented, No acute distress. HEENT: [place] AT, moist mucus membranes.  Trachea midline, no masses. Cardiovascular: No clubbing, cyanosis, or edema. Respiratory: Normal respiratory effort, no increased work of breathing. Skin: No rashes, bruises or suspicious lesions. GU: Drain tube in place draining slightly cloudy cola colored urine.  Healing blisters on adjacent skin from presumed tape allergy.  Skin around drain site clean dry and intact. Neurologic: Grossly intact, no focal deficits, moving all 4 extremities. Psychiatric: Normal mood and  affect.  Laboratory Data:  Lab Results  Component Value Date   CREATININE 0.80 05/30/2020    Lab Results  Component Value Date   HGBA1C 5.9 (A) 01/01/2020     Assessment & Plan:    1. Gross hematuria High risk due to smoking history. Cystoscopy 06/01/20 is unremarkable.  No further episodes  2. Renal abscess, left Patient had new dressing applied to wound. Suspect this is likely a superinfected cyst although did discuss case with Dr. Fredia Sorrow, some question whether or not it communicates with the collecting system although not suspected based on CT scan and low output.  We will send her creatinine is precaution Currently on Keflex however culture growing bacteria low 80s which is beta-lactamase positive, called micro lab to have them add culture and sensitivity data, given that he is clinically well will hold off on changing antibiotics at this time. Patient will RTC on 06/10/2020 for possible drainage removal.  -Drainage/Fluid collection creatinine sent.   [facility] [address] [place], [place] [address] [phone]  I, Theador Hawthorne, am acting as a scribe for Dr. Vanna Scotland.  I have reviewed the above documentation for accuracy and completeness, and I agree with the above.   Vanna Scotland, MD  I spent 30 total minutes on the day of the encounter including pre-visit review of the medical record, face-to-face time with the patient, and post visit ordering of labs/imaging/tests.

## 2020-06-08 ENCOUNTER — Ambulatory Visit (INDEPENDENT_AMBULATORY_CARE_PROVIDER_SITE_OTHER): Payer: Medicare Other | Admitting: Urology

## 2020-06-08 ENCOUNTER — Other Ambulatory Visit: Payer: Self-pay

## 2020-06-08 VITALS — BP 138/80 | HR 93

## 2020-06-08 DIAGNOSIS — N151 Renal and perinephric abscess: Secondary | ICD-10-CM

## 2020-06-08 NOTE — Progress Notes (Signed)
JP tube drained : 10 ML drained sent to lab for creatnine. Dressing applied to wound. Patient tolerated well. Performed by Gerarda Gunther, RMA.

## 2020-06-09 ENCOUNTER — Telehealth: Payer: Self-pay | Admitting: Urology

## 2020-06-09 LAB — CREATININE, BODY FLUID OTHER: Creatinine, Body Fluid: 0.8 mg/dL

## 2020-06-09 NOTE — Telephone Encounter (Signed)
ok 

## 2020-06-09 NOTE — Telephone Encounter (Signed)
This patient ended up growing a bacteria from his drain called Bacteroides.  I called the lab today to ask for sensitivity data on this.  I do not think his Keflex is sufficient.    As such, I would hold off on removing his drain tomorrow as scheduled.  Can you please reschedule him for early next week in [facility] for this?    We will call him and change his antibiotics as appropriate.  Discussed this with [facility] as well.  Vanna Scotland, MD

## 2020-06-09 NOTE — Telephone Encounter (Signed)
Patient informed, voiced understanding.  °

## 2020-06-09 NOTE — Telephone Encounter (Signed)
Attempted to leave VM, unable to leave VM not setup.

## 2020-06-09 NOTE — Telephone Encounter (Signed)
Lab returned call cannot run values per CLIA guidelines regarding sensitivity to Levofloxacin MSV values. Can just run two sensitivites to Cipro and Amoxicillin. Please advise how to proceed.

## 2020-06-10 ENCOUNTER — Ambulatory Visit: Payer: Medicare Other | Admitting: Urology

## 2020-06-14 ENCOUNTER — Other Ambulatory Visit: Payer: Self-pay | Admitting: Family Medicine

## 2020-06-14 ENCOUNTER — Other Ambulatory Visit: Payer: Self-pay

## 2020-06-14 ENCOUNTER — Ambulatory Visit: Payer: Medicare Other | Admitting: Urology

## 2020-06-14 ENCOUNTER — Ambulatory Visit (INDEPENDENT_AMBULATORY_CARE_PROVIDER_SITE_OTHER): Payer: Medicare Other | Admitting: Urology

## 2020-06-14 ENCOUNTER — Encounter: Payer: Self-pay | Admitting: Urology

## 2020-06-14 VITALS — BP 155/90 | HR 103 | Ht 76.0 in | Wt 263.0 lb

## 2020-06-14 DIAGNOSIS — N151 Renal and perinephric abscess: Secondary | ICD-10-CM | POA: Diagnosis not present

## 2020-06-15 NOTE — Progress Notes (Signed)
06/14/2020 9:29 AM   Jesse Gardner 07-22-1946 [id number]  Referring provider: Olin Hauser, DO [address] [place],  [place] [address]  Chief Complaint  Patient presents with  . Follow-up    HPI: Jesse Gardner is a 74 y.o. male with a left renal abscess treated with percutaneous drainage and oral antibiotics who presents today to have his drain removed.  Renal abscess was incidentally discovered during a hematuria work-up on CT urogram.  He states his only symptom was gross hematuria.  He underwent percutaneous drainage of the abscess on June 02, 2020.  Cytology from the abscess was negative for malignancy.   Abscess culture positive for bacteroides uniformis.  Sensitivities are still pending as of this visit.  He will finish his Keflex this Thursday.  Patient denies any modifying or aggravating factors.  Patient denies any gross hematuria, dysuria or suprapubic/flank pain.  Patient denies any fevers, chills, nausea or vomiting.   He states that he has had less than 20 cc of drainage over the last 24 hours.   PMH: Past Medical History:  Diagnosis Date  . Hypertension     Surgical History: Past Surgical History:  Procedure Laterality Date  . APPENDECTOMY    . HERNIA REPAIR      Home Medications:  Allergies as of 06/14/2020   No Known Allergies     Medication List       Accurate as of June 14, 2020 11:59 PM. If you have any questions, ask your nurse or doctor.        cephALEXin 500 MG capsule Commonly known as: Keflex Take 1 capsule (500 mg total) by mouth 3 (three) times daily.   fluticasone 50 MCG/ACT nasal spray Commonly known as: FLONASE Place 2 sprays into both nostrils daily. Use for 4-6 weeks then stop and use seasonally or as needed.   losartan 50 MG tablet Commonly known as: COZAAR Take 1 tablet (50 mg total) by mouth daily.   Melatonin 5 MG Caps Take by mouth.   naproxen sodium 220 MG tablet Commonly known as: ALEVE Take 440 mg  by mouth daily as needed.   OSTEO BI-FLEX TRIPLE STRENGTH PO Take by mouth.       Allergies: No Known Allergies  Family History: Family History  Problem Relation Age of Onset  . Alzheimer's disease Mother   . Diabetes Father   . Prostate cancer Brother     Social History:  reports that he has been smoking cigarettes. He has a 14.25 pack-year smoking history. He uses smokeless tobacco. He reports current alcohol use of about 1.0 standard drink of alcohol per week. He reports that he does not use drugs.  ROS: Pertinent ROS in HPI  Physical Exam: BP (!) 155/90   Pulse (!) 103   Ht _0  (1.93 m)   Wt 263 lb (119.3 kg)   BMI 32.01 kg/m   Constitutional:  Well nourished. Alert and oriented, No acute distress. HEENT: [place] AT, mask in place.  Trachea midline, no masses. Cardiovascular: No clubbing, cyanosis, or edema. Respiratory: Normal respiratory effort, no increased work of breathing. GU: No CVA tenderness.  No bladder fullness or masses.  JP drain is noted to have 20 cc of red milky fluid.  Drainage site is clean and dry. Neurologic: Grossly intact, no focal deficits, moving all 4 extremities. Psychiatric: Normal mood and affect.  Laboratory Data: Lab Results  Component Value Date   WBC 9.9 06/02/2020   HGB 11.7 (L) 06/02/2020  HCT 37.0 (L) 06/02/2020   MCV 87.9 06/02/2020   PLT 249 06/02/2020    Lab Results  Component Value Date   CREATININE 0.80 05/30/2020    Lab Results  Component Value Date   PSA 1.8 01/19/2019    Lab Results  Component Value Date   HGBA1C 5.9 (A) 01/01/2020       Component Value Date/Time   CHOL 220 (H) 01/19/2019 0808   HDL 43 01/19/2019 0808   CHOLHDL 5.1 (H) 01/19/2019 0808   LDLCALC 153 (H) 01/19/2019 0808    Lab Results  Component Value Date   AST 23 01/19/2019   Lab Results  Component Value Date   ALT 23 01/19/2019    Urinalysis    Component Value Date/Time   APPEARANCEUR Cloudy (A) 06/01/2020 1112   GLUCOSEU  Negative 06/01/2020 1112   BILIRUBINUR Negative 06/01/2020 1112   PROTEINUR 1+ (A) 06/01/2020 1112   UROBILINOGEN 0.2 05/06/2020 1108   NITRITE Negative 06/01/2020 1112   LEUKOCYTESUR 3+ (A) 06/01/2020 1112    I have reviewed the labs.  The locking device on the 10 French Mac Loc percutaneous drainage system is on snapped and JP drain in catheter removed from the left flank without difficulty to the patient.  Patient tolerated the procedure well.  The insertion site is covered with gauze and paper tape.  The patient is advised not to soak in water for the next week.  Assessment & Plan:   1. Left renal abscess Drain removed Patient to complete the Keflex Sensitivities are still pending at this time We will obtain follow-up imaging to ensure resolution of abscess and to evaluate for any underlying malignancy  Return in about 1 month (around 07/15/2020) for RUS report .  These notes generated with voice recognition software. I apologize for typographical errors.  Zara Council, PA-C  [facility] [address]  [place], [place] [address] [phone]

## 2020-06-17 LAB — AEROBIC/ANAEROBIC CULTURE W GRAM STAIN (SURGICAL/DEEP WOUND)

## 2020-06-20 LAB — SUSCEPTIBILITY, AER + ANAEROB

## 2020-06-20 LAB — SUSCEPTIBILITY RESULT

## 2020-07-05 ENCOUNTER — Other Ambulatory Visit: Payer: Self-pay | Admitting: *Deleted

## 2020-07-05 DIAGNOSIS — E669 Obesity, unspecified: Secondary | ICD-10-CM

## 2020-07-05 DIAGNOSIS — E7800 Pure hypercholesterolemia, unspecified: Secondary | ICD-10-CM

## 2020-07-05 DIAGNOSIS — R7303 Prediabetes: Secondary | ICD-10-CM

## 2020-07-05 DIAGNOSIS — I10 Essential (primary) hypertension: Secondary | ICD-10-CM

## 2020-07-05 DIAGNOSIS — N138 Other obstructive and reflux uropathy: Secondary | ICD-10-CM

## 2020-07-05 DIAGNOSIS — Z0000 Encounter for general adult medical examination without abnormal findings: Secondary | ICD-10-CM

## 2020-07-06 ENCOUNTER — Other Ambulatory Visit: Payer: Medicare Other

## 2020-07-06 ENCOUNTER — Other Ambulatory Visit: Payer: Self-pay

## 2020-07-06 DIAGNOSIS — E7800 Pure hypercholesterolemia, unspecified: Secondary | ICD-10-CM | POA: Diagnosis not present

## 2020-07-06 DIAGNOSIS — I10 Essential (primary) hypertension: Secondary | ICD-10-CM | POA: Diagnosis not present

## 2020-07-06 DIAGNOSIS — R7303 Prediabetes: Secondary | ICD-10-CM | POA: Diagnosis not present

## 2020-07-07 LAB — COMPLETE METABOLIC PANEL WITH GFR
AG Ratio: 1.4 (calc) (ref 1.0–2.5)
ALT: 17 U/L (ref 9–46)
AST: 19 U/L (ref 10–35)
Albumin: 4.4 g/dL (ref 3.6–5.1)
Alkaline phosphatase (APISO): 56 U/L (ref 35–144)
BUN: 16 mg/dL (ref 7–25)
CO2: 26 mmol/L (ref 20–32)
Calcium: 9.3 mg/dL (ref 8.6–10.3)
Chloride: 104 mmol/L (ref 98–110)
Creat: 0.9 mg/dL (ref 0.70–1.18)
GFR, Est African American: 97 mL/min/{1.73_m2} (ref >=60)
GFR, Est Non African American: 84 mL/min/{1.73_m2} (ref >=60)
Globulin: 3.1 g/dL (calc) (ref 1.9–3.7)
Glucose, Bld: 98 mg/dL (ref 65–99)
Potassium: 4.2 mmol/L (ref 3.5–5.3)
Sodium: 139 mmol/L (ref 135–146)
Total Bilirubin: 0.5 mg/dL (ref 0.2–1.2)
Total Protein: 7.5 g/dL (ref 6.1–8.1)

## 2020-07-07 LAB — HEMOGLOBIN A1C
Hgb A1c MFr Bld: 5.8 % of total Hgb — ABNORMAL HIGH (ref <5.7)
Mean Plasma Glucose: 120 (calc)
eAG (mmol/L): 6.6 (calc)

## 2020-07-07 LAB — LIPID PANEL
Cholesterol: 208 mg/dL — ABNORMAL HIGH (ref <200)
HDL: 39 mg/dL — ABNORMAL LOW (ref >=40)
LDL Cholesterol (Calc): 148 mg/dL (calc) — ABNORMAL HIGH
Non-HDL Cholesterol (Calc): 169 mg/dL (calc) — ABNORMAL HIGH (ref <130)
Total CHOL/HDL Ratio: 5.3 (calc) — ABNORMAL HIGH (ref <5.0)
Triglycerides: 98 mg/dL (ref <150)

## 2020-07-07 LAB — CBC WITH DIFFERENTIAL/PLATELET
Absolute Monocytes: 856 cells/uL (ref 200–950)
Basophils Absolute: 46 cells/uL (ref 0–200)
Basophils Relative: 0.5 %
Eosinophils Absolute: 350 cells/uL (ref 15–500)
Eosinophils Relative: 3.8 %
HCT: 39.8 % (ref 38.5–50.0)
Hemoglobin: 12.9 g/dL — ABNORMAL LOW (ref 13.2–17.1)
Lymphs Abs: 3570 cells/uL (ref 850–3900)
MCH: 27.9 pg (ref 27.0–33.0)
MCHC: 32.4 g/dL (ref 32.0–36.0)
MCV: 86.1 fL (ref 80.0–100.0)
MPV: 11.4 fL (ref 7.5–12.5)
Monocytes Relative: 9.3 %
Neutro Abs: 4379 cells/uL (ref 1500–7800)
Neutrophils Relative %: 47.6 %
Platelets: 226 10*3/uL (ref 140–400)
RBC: 4.62 10*6/uL (ref 4.20–5.80)
RDW: 14.2 % (ref 11.0–15.0)
Total Lymphocyte: 38.8 %
WBC: 9.2 10*3/uL (ref 3.8–10.8)

## 2020-07-07 LAB — PSA: PSA: 1.5 ng/mL (ref <=4.0)

## 2020-07-12 ENCOUNTER — Other Ambulatory Visit: Payer: Self-pay

## 2020-07-12 ENCOUNTER — Other Ambulatory Visit: Payer: Self-pay | Admitting: Urology

## 2020-07-12 ENCOUNTER — Ambulatory Visit (INDEPENDENT_AMBULATORY_CARE_PROVIDER_SITE_OTHER): Payer: Medicare Other | Admitting: Family Medicine

## 2020-07-12 ENCOUNTER — Encounter: Payer: Self-pay | Admitting: Family Medicine

## 2020-07-12 VITALS — BP 137/71 | HR 105 | Temp 97.3°F | Resp 16 | Ht 76.0 in | Wt 262.0 lb

## 2020-07-12 DIAGNOSIS — N401 Enlarged prostate with lower urinary tract symptoms: Secondary | ICD-10-CM

## 2020-07-12 DIAGNOSIS — Z1211 Encounter for screening for malignant neoplasm of colon: Secondary | ICD-10-CM

## 2020-07-12 DIAGNOSIS — Z0000 Encounter for general adult medical examination without abnormal findings: Secondary | ICD-10-CM | POA: Diagnosis not present

## 2020-07-12 DIAGNOSIS — Z23 Encounter for immunization: Secondary | ICD-10-CM

## 2020-07-12 DIAGNOSIS — E669 Obesity, unspecified: Secondary | ICD-10-CM

## 2020-07-12 DIAGNOSIS — J432 Centrilobular emphysema: Secondary | ICD-10-CM

## 2020-07-12 DIAGNOSIS — R7303 Prediabetes: Secondary | ICD-10-CM

## 2020-07-12 DIAGNOSIS — E7800 Pure hypercholesterolemia, unspecified: Secondary | ICD-10-CM | POA: Diagnosis not present

## 2020-07-12 DIAGNOSIS — M170 Bilateral primary osteoarthritis of knee: Secondary | ICD-10-CM

## 2020-07-12 DIAGNOSIS — N151 Renal and perinephric abscess: Secondary | ICD-10-CM

## 2020-07-12 DIAGNOSIS — I10 Essential (primary) hypertension: Secondary | ICD-10-CM

## 2020-07-12 DIAGNOSIS — N138 Other obstructive and reflux uropathy: Secondary | ICD-10-CM

## 2020-07-12 MED ORDER — ROSUVASTATIN CALCIUM 10 MG PO TABS: 10.0000 mg | ORAL_TABLET | Freq: Every day | ORAL | 5 refills | Status: DC

## 2020-07-12 NOTE — Progress Notes (Signed)
Subjective:    Patient ID: Jesse Gardner, male    DOB: 04/11/1946, 74 y.o.   MRN: [id number]  Jesse Gardner is a [age] y.o. male presenting on 07/12/2020 for Annual Exam   HPI   Here for Annual Physical  Pre-Diabetes Improved A1c trend from 6.2 to 6.0. Now due A1c Meds:None He is on ARB Lifestyle: - Diet (Reduced sugar added to tea and coffee, he has reduced desserts even more now, less cobbler) - Exercise (limited due to knee arthritis) Denies hypoglycemia, polyuria, visual changes, numbness or tingling.  Essential Hypertension/ Obesity BMI >32 History of irregular heart rate, identified for procedure - had episodic tachycardia without arrhythmia, recently reported Home BP has been in normal range mostly Current Meds -Losartan 50mg  daily Lifestyle: - Diet:Drinks up topot of coffee daily, drinks tea. Not drinking soft drinks. - Exercise:No longer going to the gym.Limited exercise Denies CP, dyspnea, HA, edema, dizziness / lightheadedness  HYPERLIPIDEMIA: - Reports no concerns. Last lipid panel 06/2020, elevated LDL 148. - Never on statin before, but interested.  Bilateral Knee Pain, Osteoarthritis Chronic problem. R>L. Known OA/DJD Worse aching after increased activity Has taken OTC Aleve, Tylenol PRN He uses OTC [place] Dream cream, Osteobiflex Tried Knee wrap with limited results. Admits occasional leg swelling and knee swelling Denies injury or trauma, bruising, redness, other joint pain  External Hemorrhoids / Anal Bleeding / Itching Burning Reports occasional episodes with some spots of blood on toilet paper or wipes, he has known large external hemorrhoid for many years, has episodic flare.  Centrilobular Emphysema No recent flare up. Prior dx on LDCT. Not on maintenance therapy.  PMH Tobacco abuse  Additional updates Gross Hematuria since 04/2020, see chart. He was referred to [facility] and they have done CT imaging, perinephric abscess, treated at  that time. Now has upcoming follow-up.  Health Maintenance:  Due for colon cancer screening yearly, last FOBT FIT test 08/2019 negative. Prior 8-10 years ago had colonoscopy. He is interested in Cologuard now.  Due for Flu Shot, will receive today     [facility] 07/12/2020 01/01/2020 09/22/2019  Decreased Interest 0 0 0  Down, Depressed, Hopeless 0 0 0  PHQ - 2 Score 0 0 0    Past Medical History:  Diagnosis Date  . Hypertension    Past Surgical History:  Procedure Laterality Date  . APPENDECTOMY    . HERNIA REPAIR     Social History   Socioeconomic History  . Marital status: Significant Other    Spouse name: Not on file  . Number of children: Not on file  . Years of education: Not on file  . Highest education level: Not on file  Occupational History  . Not on file  Tobacco Use  . Smoking status: Current Some Day Smoker    Packs/day: 0.25    Years: 57.00    Pack years: 14.25    Types: Cigarettes  . Smokeless tobacco: Current User  . Tobacco comment: 1 cigarette in the last month   Vaping Use  . Vaping Use: Never used  Substance and Sexual Activity  . Alcohol use: Yes    Alcohol/week: 1.0 standard drink    Types: 1 Glasses of wine per week  . Drug use: Never  . Sexual activity: Not on file  Other Topics Concern  . Not on file  Social History Narrative  . Not on file   Social Determinants of Health   Financial Resource Strain: Low Risk   .  Difficulty of Paying Living Expenses: Not hard at all  Food Insecurity: No Food Insecurity  . Worried About Programme researcher, broadcasting/film/video in the Last Year: Never true  . Ran Out of Food in the Last Year: Never true  Transportation Needs: No Transportation Needs  . Lack of Transportation (Medical): No  . Lack of Transportation (Non-Medical): No  Physical Activity: Inactive  . Days of Exercise per Week: 0 days  . Minutes of Exercise per Session: 0 min  Stress: Stress Concern Present  . Feeling of Stress : To  some extent  Social Connections: Moderately Isolated  . Frequency of Communication with Friends and Family: Three times a week  . Frequency of Social Gatherings with Friends and Family: Three times a week  . Attends Religious Services: Never  . Active Member of Clubs or Organizations: No  . Attends Banker Meetings: Never  . Marital Status: Living with partner  Intimate Partner Violence: Not At Risk  . Fear of Current or Ex-Partner: No  . Emotionally Abused: No  . Physically Abused: No  . Sexually Abused: No   Family History  Problem Relation Age of Onset  . Alzheimer's disease Mother   . Diabetes Father   . Prostate cancer Brother    Current Outpatient Medications on File Prior to Visit  Medication Sig  . fluticasone (FLONASE) 50 MCG/ACT nasal spray Place 2 sprays into both nostrils daily. Use for 4-6 weeks then stop and use seasonally or as needed.  Marland Kitchen losartan (COZAAR) 50 MG tablet Take 1 tablet (50 mg total) by mouth daily.  . Melatonin 5 MG CAPS Take by mouth.  . Misc Natural Products (OSTEO BI-FLEX TRIPLE STRENGTH PO) Take by mouth.  . naproxen sodium (ALEVE) 220 MG tablet Take 440 mg by mouth daily as needed.   No current facility-administered medications on file prior to visit.    Review of Systems  Constitutional: Negative for activity change, appetite change, chills, diaphoresis, fatigue and fever.  HENT: Negative for congestion and hearing loss.   Eyes: Negative for visual disturbance.  Respiratory: Negative for apnea, cough, chest tightness, shortness of breath and wheezing.   Cardiovascular: Negative for chest pain, palpitations and leg swelling.  Gastrointestinal: Negative for abdominal pain, anal bleeding, blood in stool, constipation, diarrhea, nausea and vomiting.  Endocrine: Negative for cold intolerance.  Genitourinary: Negative for difficulty urinating, dysuria, frequency and hematuria.  Musculoskeletal: Negative for arthralgias, back pain and  neck pain.  Skin: Negative for rash.  Allergic/Immunologic: Negative for environmental allergies.  Neurological: Negative for dizziness, weakness, light-headedness, numbness and headaches.  Hematological: Negative for adenopathy.  Psychiatric/Behavioral: Negative for behavioral problems, dysphoric mood and sleep disturbance. The patient is not nervous/anxious.    Per HPI unless specifically indicated above      Objective:    BP 137/71   Pulse (!) 105   Temp (!) 97.3 F (36.3 C) (Temporal)   Resp 16   Ht  (1.93 m)   Wt 262 lb (118.8 kg)   SpO2 96%   BMI 31.89 kg/m   Wt Readings from Last 3 Encounters:  07/12/20 262 lb (118.8 kg)  06/14/20 263 lb (119.3 kg)  06/02/20 262 lb (118.8 kg)    Physical Exam Vitals and nursing note reviewed.  Constitutional:      General: He is not in acute distress.    Appearance: He is well-developed. He is not diaphoretic.     Comments: Well-appearing, comfortable, cooperative  HENT:  Head: Normocephalic and atraumatic.  Eyes:     General:        Right eye: No discharge.        Left eye: No discharge.     Conjunctiva/sclera: Conjunctivae normal.     Pupils: Pupils are equal, round, and reactive to light.  Neck:     Thyroid: No thyromegaly.  Cardiovascular:     Rate and Rhythm: Normal rate and regular rhythm.     Heart sounds: Normal heart sounds. No murmur heard.   Pulmonary:     Effort: Pulmonary effort is normal. No respiratory distress.     Breath sounds: Normal breath sounds. No wheezing or rales.  Abdominal:     General: Bowel sounds are normal. There is no distension.     Palpations: Abdomen is soft. There is no mass.     Tenderness: There is no abdominal tenderness.  Musculoskeletal:        General: No tenderness. Normal range of motion.     Cervical back: Normal range of motion and neck supple.     Right lower leg: No edema.     Left lower leg: No edema.     Comments: Upper / Lower Extremities: - Normal muscle  tone, strength bilateral upper extremities 5/5, lower extremities 5/5  Lymphadenopathy:     Cervical: No cervical adenopathy.  Skin:    General: Skin is warm and dry.     Findings: No erythema or rash.  Neurological:     Mental Status: He is alert and oriented to person, place, and time.     Comments: Distal sensation intact to light touch all extremities  Psychiatric:        Behavior: Behavior normal.     Comments: Well groomed, good eye contact, normal speech and thoughts       Recent Labs    01/01/20 0959 07/06/20 0808  HGBA1C 5.9* 5.8*     Results for orders placed or performed in visit on 07/05/20  PSA  Result Value Ref Range   PSA 1.5 < OR = 4.0 ng/mL  Lipid panel  Result Value Ref Range   Cholesterol 208 (H) <200 mg/dL   HDL 39 (L) > OR = 40 mg/dL   Triglycerides 98 <893 mg/dL   LDL Cholesterol (Calc) 148 (H) mg/dL (calc)   Total CHOL/HDL Ratio 5.3 (H) <5.0 (calc)   Non-HDL Cholesterol (Calc) 169 (H) <130 mg/dL (calc)  COMPLETE METABOLIC PANEL WITH GFR  Result Value Ref Range   Glucose, Bld 98 65 - 99 mg/dL   BUN 16 7 - 25 mg/dL   Creat 7.34 2.87 - 6.81 mg/dL   GFR, Est Non African American 84 > OR = 60 mL/min/1.22m2   GFR, Est African American 97 > OR = 60 mL/min/1.73m2   BUN/Creatinine Ratio NOT APPLICABLE 6 - 22 (calc)   Sodium 139 135 - 146 mmol/L   Potassium 4.2 3.5 - 5.3 mmol/L   Chloride 104 98 - 110 mmol/L   CO2 26 20 - 32 mmol/L   Calcium 9.3 8.6 - 10.3 mg/dL   Total Protein 7.5 6.1 - 8.1 g/dL   Albumin 4.4 3.6 - 5.1 g/dL   Globulin 3.1 1.9 - 3.7 g/dL (calc)   AG Ratio 1.4 1.0 - 2.5 (calc)   Total Bilirubin 0.5 0.2 - 1.2 mg/dL   Alkaline phosphatase (APISO) 56 35 - 144 U/L   AST 19 10 - 35 U/L   ALT 17 9 - 46 U/L  CBC  with Differential/Platelet  Result Value Ref Range   WBC 9.2 3.8 - 10.8 Thousand/uL   RBC 4.62 4.20 - 5.80 Million/uL   Hemoglobin 12.9 (L) 13.2 - 17.1 g/dL   HCT 10.2 38 - 50 %   MCV 86.1 80.0 - 100.0 fL   MCH 27.9 27.0 -  33.0 pg   MCHC 32.4 32.0 - 36.0 g/dL   RDW 72.5 36.6 - 44.0 %   Platelets 226 140 - 400 Thousand/uL   MPV 11.4 7.5 - 12.5 fL   Neutro Abs 4,379 1,500 - 7,800 cells/uL   Lymphs Abs 3,570 850 - 3,900 cells/uL   Absolute Monocytes 856 200 - 950 cells/uL   Eosinophils Absolute 350 15 - 500 cells/uL   Basophils Absolute 46 0 - 200 cells/uL   Neutrophils Relative % 47.6 %   Total Lymphocyte 38.8 %   Monocytes Relative 9.3 %   Eosinophils Relative 3.8 %   Basophils Relative 0.5 %  Hemoglobin A1c  Result Value Ref Range   Hgb A1c MFr Bld 5.8 (H) <5.7 % of total Hgb   Mean Plasma Glucose 120 (calc)   eAG (mmol/L) 6.6 (calc)      Assessment & Plan:   Problem List Items Addressed This Visit    Pure hypercholesterolemia    Uncontrolled cholesterol >148 LDL, persistent elevated The 10-year ASCVD risk score Denman George DC Jr., et al., 2013) is: 35.5%  Plan: 1. START new statin Rosuvastatin 10mg  nightly, 30 pills with refills, counseling on statin dosing side effect 2. Encourage improved lifestyle - low carb/cholesterol, reduce portion size, continue improving regular exercise  Return 3 months for CMET, Lipid      Relevant Medications   rosuvastatin (CRESTOR) 10 MG tablet   Pre-diabetes    Improved A1c 5.8 Concern with obesity, HTN, HLD  Plan:  1. Not on any therapy currently  2. Encourage improved lifestyle - low carb, low sugar diet, reduce portion size, continue improving regular exercise      Osteoarthritis of knees, bilateral   Obesity (BMI 30.0-34.9)    Weight stable 262 lbs Encourage lifestyle diet exercise regimen      Essential hypertension    Controlled HTN. - Home BP readings improved avg  No known complications     Plan:  1. Continue current med - Losartan 50mg  daily - re order today 2. Encourage improved lifestyle - low sodium diet, regular exercise 3. 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 Some increased work of breathing Chronic problem, with active tobacco abuse long history Declined maintenance therapy inhalers  Counseling provided Follow yearly LDCT Lung Scan      BPH with obstruction/lower urinary tract symptoms    BPH LUTS Followed by [facility] Had gross hematuria and perinephric abscess being treated now  PSA 1.5 normal       Other Visit Diagnoses    Annual physical exam    -  Primary   Screening for colon cancer       Relevant Orders   Cologuard   Needs flu shot       Relevant Orders   Flu Vaccine QUAD High Dose(Fluad)      Updated Health Maintenance information - Flu shot today, high dose - Ordered Cologuard, due for  screening. Reviewed recent lab results with patient Encouraged improvement to lifestyle with diet and exercise - Goal of weight loss  Orders Placed This Encounter  Procedures  . Flu Vaccine QUAD High Dose(Fluad)  . Cologuard     Meds ordered this encounter  Medications  . rosuvastatin (CRESTOR) 10 MG tablet    Sig: Take 1 tablet (10 mg total) by mouth at bedtime.    Dispense:  30 tablet    Refill:  5      Follow up plan: Return in about 3 months (around 10/11/2020) for 3 month fasting lab only then 1 week later Lipid results, statin med.   Future labs CMET Lipid in 3 months for Statin  Saralyn Pilar, DO [facility] 07/12/2020, 9:11 AM

## 2020-07-12 NOTE — Assessment & Plan Note (Signed)
Uncontrolled cholesterol >148 LDL, persistent elevated The 10-year ASCVD risk score Denman George DC Jr., et al., 2013) is: 35.5%  Plan: 1. START new statin Rosuvastatin 10mg  nightly, 30 pills with refills, counseling on statin dosing side effect 2. Encourage improved lifestyle - low carb/cholesterol, reduce portion size, continue improving regular exercise  Return 3 months for CMET, Lipid

## 2020-07-12 NOTE — Assessment & Plan Note (Signed)
Improved A1c 5.8 Concern with obesity, HTN, HLD  Plan:  1. Not on any therapy currently  2. Encourage improved lifestyle - low carb, low sugar diet, reduce portion size, continue improving regular exercise

## 2020-07-12 NOTE — Assessment & Plan Note (Addendum)
BPH LUTS Followed by [facility] Had gross hematuria and perinephric abscess being treated now  PSA 1.5 normal

## 2020-07-12 NOTE — Assessment & Plan Note (Signed)
Weight stable 262 lbs Encourage lifestyle diet exercise regimen

## 2020-07-12 NOTE — Assessment & Plan Note (Signed)
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

## 2020-07-12 NOTE — Patient Instructions (Addendum)
Thank you for coming to the office today.  1. Chemistry - Normal results, including electrolytes, kidney and liver function. Normal fasting blood sugar   2. Hemoglobin A1c (Diabetes screening) - 5.8, in range of Pre-Diabetes (>5.7 to 6.4) improved from 5.9 to 6.2  3. PSA Prostate Cancer Screening - 1.5, negative.  4. Cholesterol - Abnormal cholesterol results. Low 39 HDL (good cholesterol), elevated 148 LDL (bad cholesterol), and normal Triglycerides.  The 10-year ASCVD risk score Mikey Bussing DC Jr., et al., 2013) is: 42.2% - Discuss statin  5. CBC Blood Counts - Improved Hemoglobin to 12.9.  You are at increased risk of future Cardiovascular complications such as Heart Attack or Stroke from an artery blockage due to abnormal cholesterol and/or risk factors. - As discussed, Statin Cholesterol pills both can both LOWER cholesterol and REDUCE this future risk of heart attack and stroke - Start Rosuvastatin (generic Crestor) $RemoveBeforeDEI'10mg'zYojLcuKklMnaOnH$  pill once at bedtime every night  If you develop mild aches or pains in muscle or joint that does NOT improve or go away after first 3-4 weeks then this may require [place] to adjust the dose. First I would recommend STOPPING the medication for a few weeks until your ache and pain symptoms completely RESOLVE. Then you can restart at a LOWER DOSE either HALF a pill at bedtime every night or LESS OFTEN such as one pill a week only and then gradually increase to every other day or max dose of 3 times a week  Lastly, sometimes we need to try other versions of this medicine to find one that works for you and does not cause side effects.    Colon Cancer Screening: - For all adults age 30+ routine colon cancer screening is highly recommended.     - Recent guidelines from [place] recommend starting age of 82 - Early detection of colon cancer is important, because often there are no warning signs or symptoms, also if found early usually it can be cured. Late stage is  hard to treat.  - If you are not interested in Colonoscopy screening (if done and normal you could be cleared for 5 to 10 years until next due), then Cologuard is an excellent alternative for screening test for Colon Cancer. It is highly sensitive for detecting DNA of colon cancer from even the earliest stages. Also, there is NO bowel prep required. - If Cologuard is NEGATIVE, then it is good for 3 years before next due - If Cologuard is POSITIVE, then it is strongly advised to get a Colonoscopy, which allows the GI doctor to locate the source of the cancer or polyp (even very early stage) and treat it by removing it. ------------------------- If you would like to proceed with Cologuard (stool DNA test) - FIRST, call your insurance company and tell them you want to check cost of Cologuard tell them CPT Code [phone] (it may be completely covered and you could get for no cost, OR max cost without any coverage is about $600). Also, keep in mind if you do NOT open the kit, and decide not to do the test, you will NOT be charged, you should contact the company if you decide not to do the test. - If you want to proceed, you can notify us (phone message, [place], or at next visit) and we will order it for you. The test kit will be delivered to you house within about 1 week. Follow instructions to collect sample, you may call the company for any  help or questions, 24/7 telephone support at [phone].  Flu shot today  DUE for FASTING BLOOD WORK (no food or drink after midnight before the lab appointment, only water or coffee without cream/sugar on the morning of)  SCHEDULE "Lab Only" visit in the morning at the clinic for lab draw in 3 MONTHS   - Make sure Lab Only appointment is at about 1 week before your next appointment, so that results will be available  For Lab Results, once available within 2-3 days of blood draw, you can can log in to MyChart online to view your results and a brief  explanation. Also, we can discuss results at next follow-up visit.   Please schedule a Follow-up Appointment to: Return in about 3 months (around 10/11/2020) for 3 month fasting lab only then 1 week later Lipid results, statin med.  If you have any other questions or concerns, please feel free to call the office or send a message through [place]. You may also schedule an earlier appointment if necessary.  Additionally, you may be receiving a survey about your experience at our office within a few days to 1 week by e-mail or mail. We value your feedback.  Nobie Putnam, DO [place]

## 2020-07-12 NOTE — Progress Notes (Signed)
RUS order is in

## 2020-07-12 NOTE — Assessment & Plan Note (Signed)
Controlled HTN. - Home BP readings improved avg  No known complications     Plan:  1. Continue current med - Losartan 50mg  daily - re order today 2. Encourage improved lifestyle - low sodium diet, regular exercise 3. Continue monitor BP outside office, bring readings to next visit, if persistently >140/90 or new symptoms notify office sooner

## 2020-07-13 ENCOUNTER — Other Ambulatory Visit: Payer: Self-pay | Admitting: Family Medicine

## 2020-07-13 DIAGNOSIS — E7800 Pure hypercholesterolemia, unspecified: Secondary | ICD-10-CM

## 2020-07-19 ENCOUNTER — Ambulatory Visit
Admission: RE | Admit: 2020-07-19 | Discharge: 2020-07-19 | Disposition: A | Discharge status: Home / Self Care | Payer: Medicare Other | Source: Ambulatory Visit | Attending: Urology | Admitting: Urology

## 2020-07-19 ENCOUNTER — Other Ambulatory Visit: Payer: Self-pay

## 2020-07-19 DIAGNOSIS — N151 Renal and perinephric abscess: Secondary | ICD-10-CM | POA: Insufficient documentation

## 2020-07-19 DIAGNOSIS — N281 Cyst of kidney, acquired: Secondary | ICD-10-CM | POA: Diagnosis not present

## 2020-07-19 IMAGING — US US RENAL
1 series · 14 of 25 positions shown · non-contrast
Comparison: [DATE].

CLINICAL DATA: Renal abscess.

EXAM:
RENAL / URINARY TRACT ULTRASOUND COMPLETE

[Series 1: us renal · 0.30mm/px · 47 acquisitions, 14 frames shown]
[im 1/47]
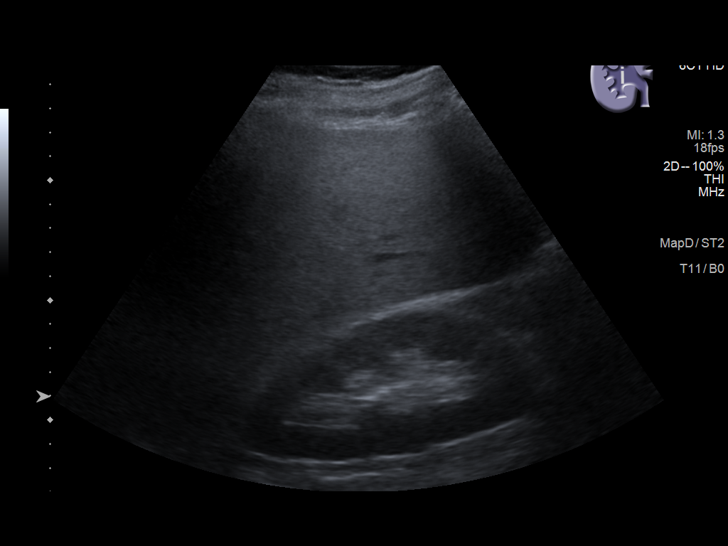
[im 4/47]
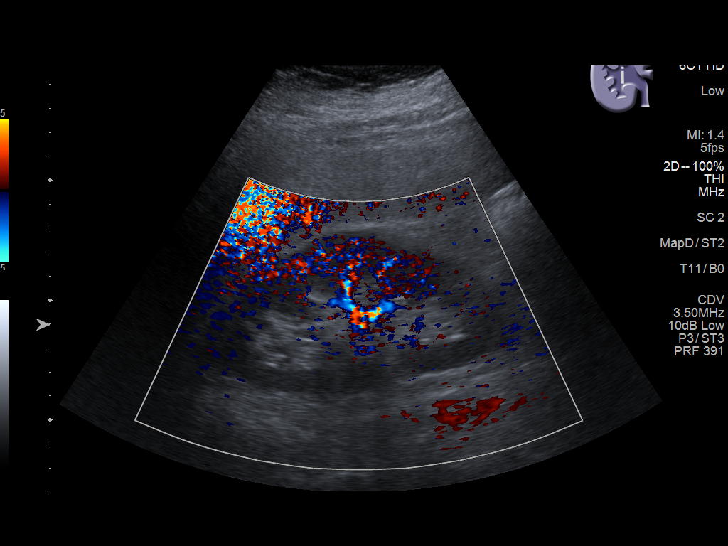
[im 8/47]
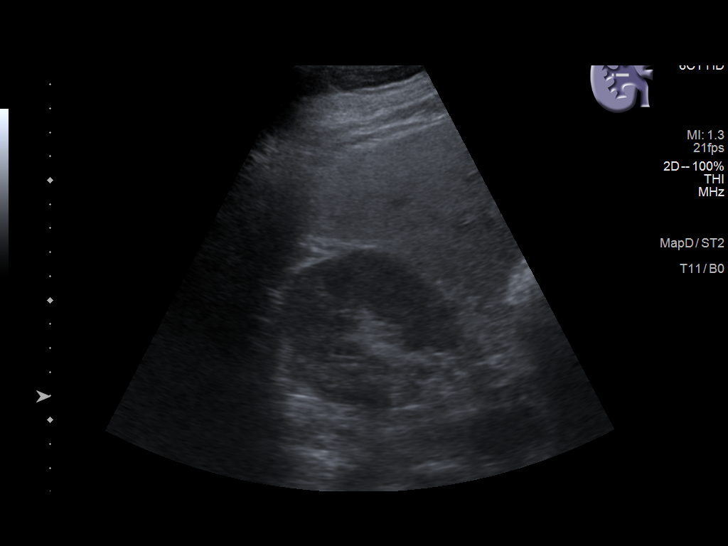
[im 12/47]
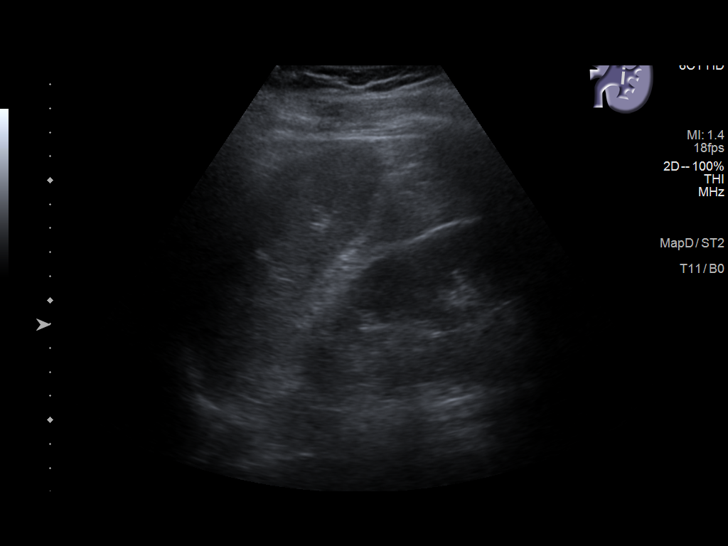
[im 16/47]
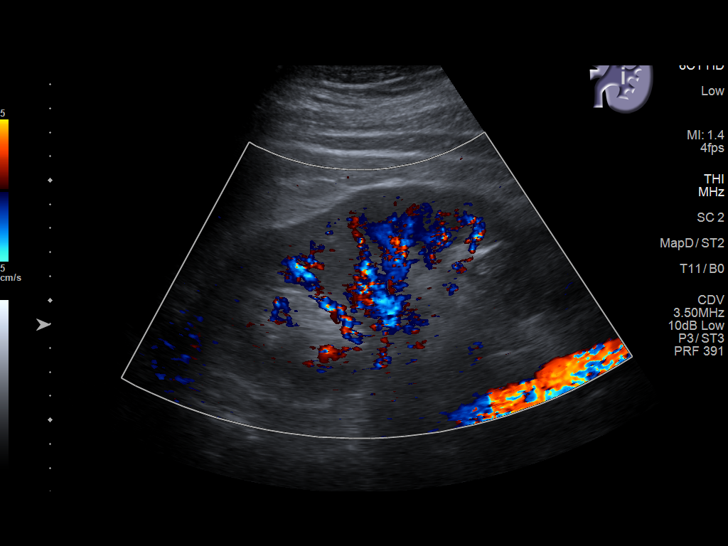
[im 18/47]
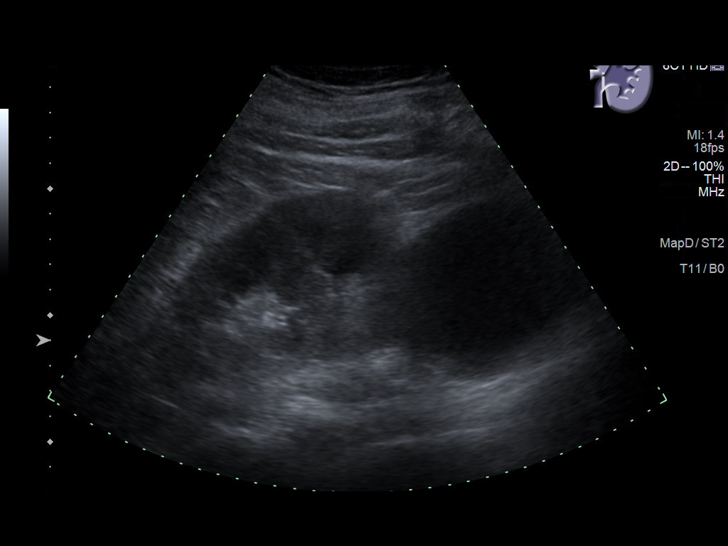
[im 22/47]
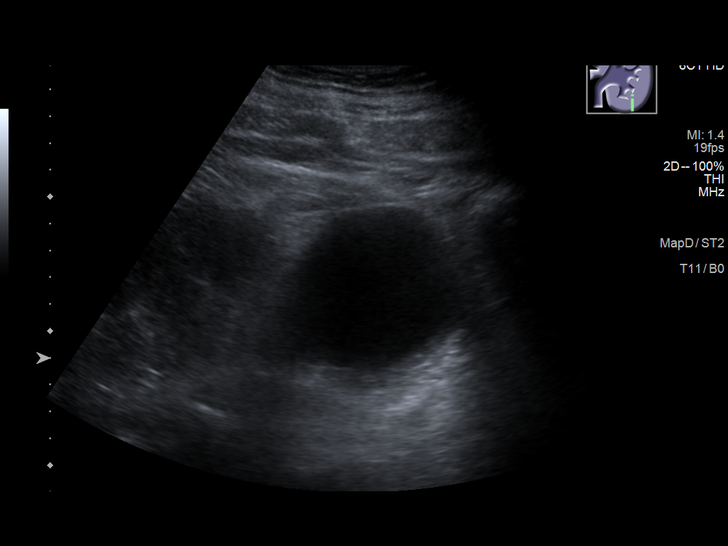
[im 25/47]
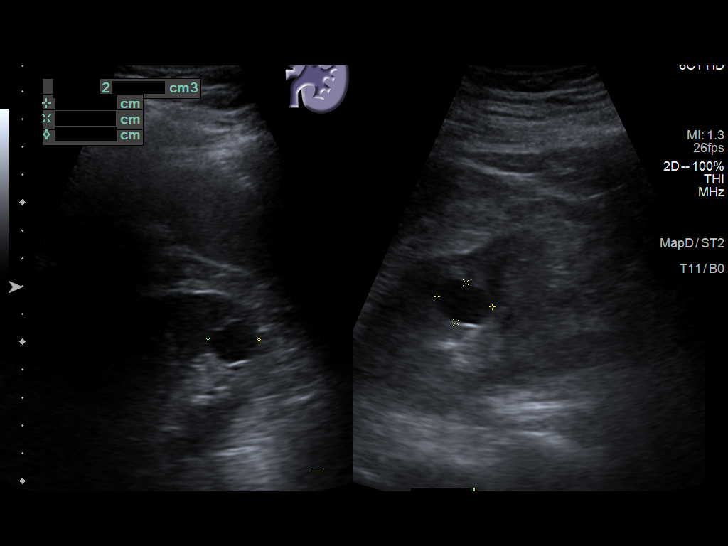
[im 29/47]
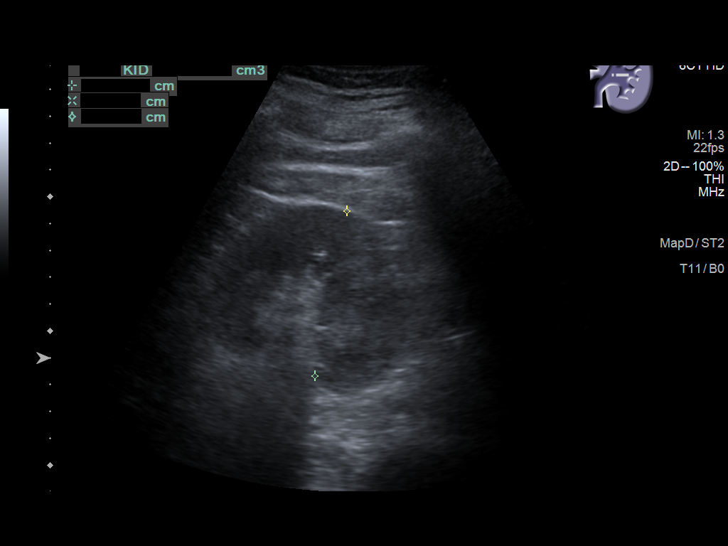
[im 31/47]
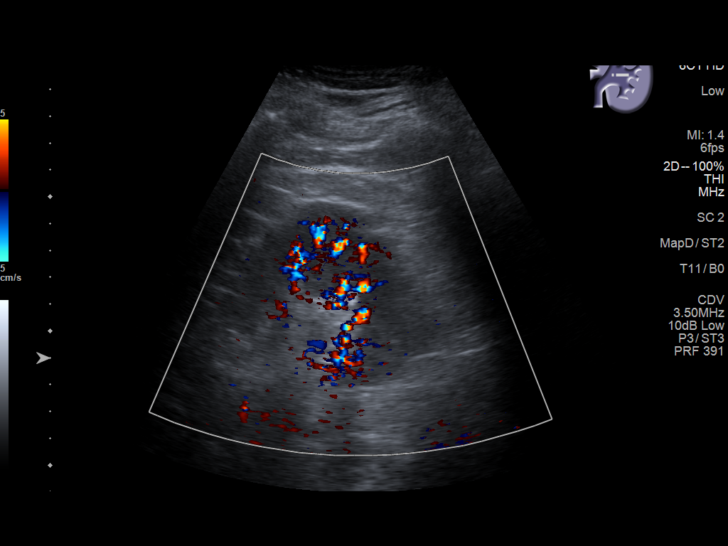
[im 35/47]
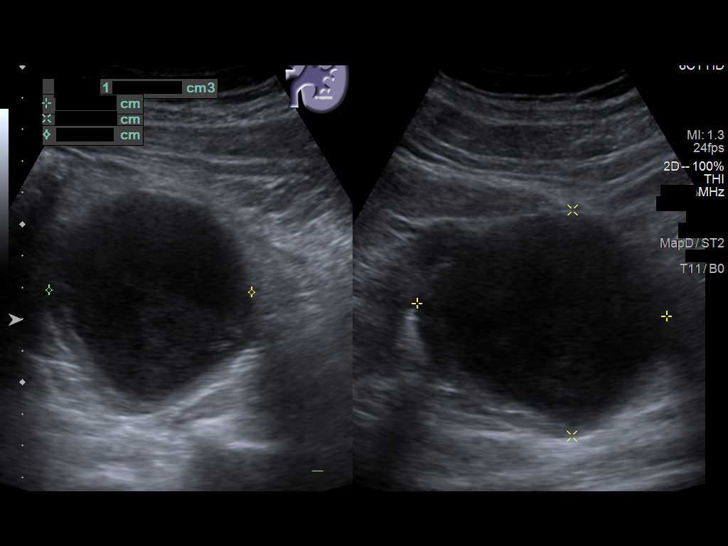
[im 39/47]
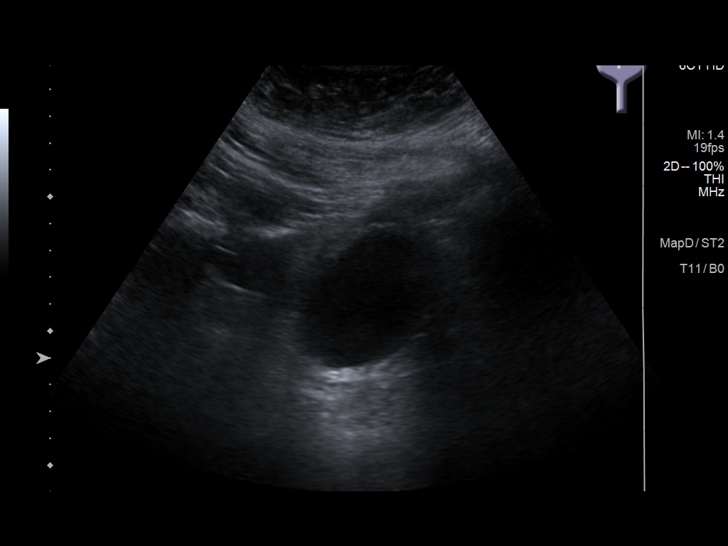
[im 43/47]
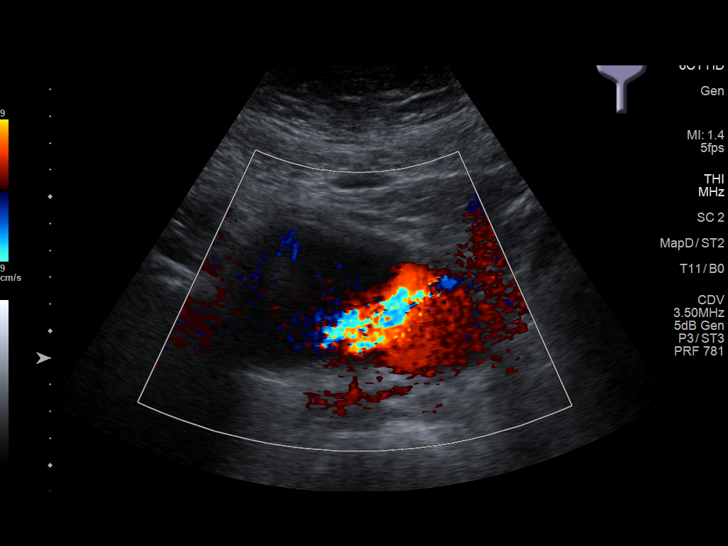
[im 47/47]
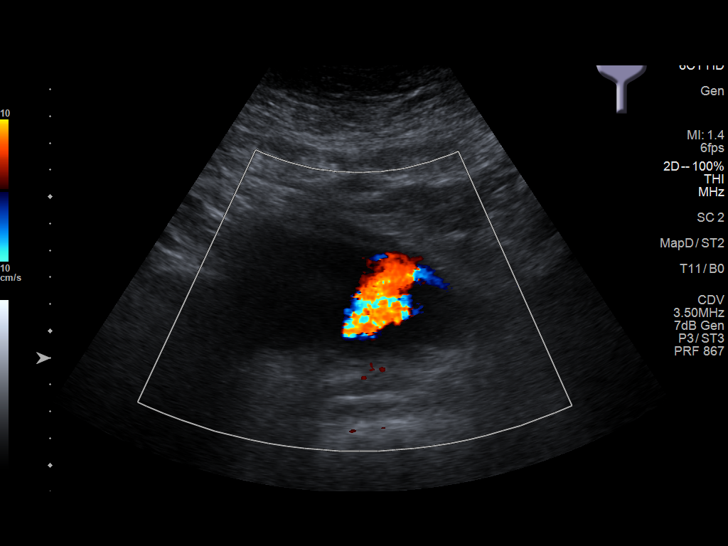

[14 of 25 positions shown; findings below may reference images not displayed]

FINDINGS: Right Kidney:

Renal measurements: 12.6 x 7.2 x 6.3 cm = volume: 297 mL.
Echogenicity within normal limits. No mass or hydronephrosis
visualized.

Left Kidney:

Renal measurements: 12.6 x 7.2 x 6.3 cm = volume: 297 mL.
Echogenicity within normal limits. 2 cm simple cyst is seen in upper
pole. There is continued presence of thick walled rounded cystic
lesion arising from the lower pole with maximum measured diameter of
7.9 cm consistent with history of renal abscess. No hydronephrosis
visualized.

Bladder:

Appears normal for degree of bladder distention. Bilateral ureteral
jets are noted.

Other:

None.
IMPRESSION: Continued presence of 7.9 cm thick walled rounded cystic lesion
arising from lower pole of left kidney consistent with history of
renal abscess.

## 2020-07-19 NOTE — Progress Notes (Signed)
07/20/2020 1:08 PM   Jesse Gardner 1946-09-25 [id number]  Referring provider: Smitty Cords, DO [address] [place],  [place] [address]  Chief Complaint  Patient presents with  . Results    35mo RUS    HPI: Jesse Gardner is a 74 y.o. male with a left renal abscess treated with percutaneous drainage and oral antibiotics who presents today for follow up renal ultrasound.    Renal abscess was incidentally discovered during a hematuria work-up on CT urogram.  He states his only symptom was gross hematuria.  He underwent percutaneous drainage of the abscess on June 02, 2020.  Cytology from the abscess was negative for malignancy.   Abscess culture positive for bacteroides uniformis.   He has not had any pain in the left flank or gross hematuria.  He is also not had any fevers, chills, nausea or vomiting.  He states that overall he feels fine.  His UA unremarkable.  RUS 07/19/2020 Continued presence of 7.9 cm thick walled rounded cystic lesion arising from lower pole of left kidney consistent with history of renal abscess.    PMH: Past Medical History:  Diagnosis Date  . Hypertension     Surgical History: Past Surgical History:  Procedure Laterality Date  . APPENDECTOMY    . HERNIA REPAIR      Home Medications:  Allergies as of 07/20/2020   No Known Allergies     Medication List       Accurate as of July 20, 2020 11:59 PM. If you have any questions, ask your nurse or doctor.        fluticasone 50 MCG/ACT nasal spray Commonly known as: FLONASE Place 2 sprays into both nostrils daily. Use for 4-6 weeks then stop and use seasonally or as needed.   losartan 50 MG tablet Commonly known as: COZAAR Take 1 tablet (50 mg total) by mouth daily.   Melatonin 5 MG Caps Take by mouth.   naproxen sodium 220 MG tablet Commonly known as: ALEVE Take 440 mg by mouth daily as needed.   OSTEO BI-FLEX TRIPLE STRENGTH PO Take by mouth.   rosuvastatin 10 MG  tablet Commonly known as: CRESTOR Take 1 tablet (10 mg total) by mouth at bedtime.       Allergies: No Known Allergies  Family History: Family History  Problem Relation Age of Onset  . Alzheimer's disease Mother   . Diabetes Father   . Prostate cancer Brother     Social History:  reports that he has been smoking cigarettes. He has a 14.25 pack-year smoking history. He uses smokeless tobacco. He reports current alcohol use of about 1.0 standard drink of alcohol per week. He reports that he does not use drugs.  ROS: Pertinent ROS in HPI  Physical Exam: BP 134/71   Pulse (!) 106   Ht 6\' 4"  (1.93 m)   Wt 262 lb (118.8 kg)   BMI 31.89 kg/m   Constitutional:  Well nourished. Alert and oriented, No acute distress. HEENT: [place] AT, mask in place.  Trachea midline Cardiovascular: No clubbing, cyanosis, or edema. Respiratory: Normal respiratory effort, no increased work of breathing. Neurologic: Grossly intact, no focal deficits, moving all 4 extremities. Psychiatric: Normal mood and affect.  Laboratory Data: Lab Results  Component Value Date   WBC 9.2 07/06/2020   HGB 12.9 (L) 07/06/2020   HCT 39.8 07/06/2020   MCV 86.1 07/06/2020   PLT 226 07/06/2020    Lab Results  Component Value Date  CREATININE 0.90 07/06/2020    Lab Results  Component Value Date   PSA 1.5 07/06/2020   PSA 1.8 01/19/2019    Lab Results  Component Value Date   HGBA1C 5.8 (H) 07/06/2020       Component Value Date/Time   CHOL 208 (H) 07/06/2020 0808   HDL 39 (L) 07/06/2020 0808   CHOLHDL 5.3 (H) 07/06/2020 0808   LDLCALC 148 (H) 07/06/2020 0808    Lab Results  Component Value Date   AST 19 07/06/2020   Lab Results  Component Value Date   ALT 17 07/06/2020    Urinalysis Component     Latest Ref Rng & Units 07/20/2020  Specific Gravity, UA     1.005 - 1.030 1.010  pH, UA     5.0 - 7.5 5.5  Color, UA     Yellow Yellow  Appearance Ur     Clear Clear  Leukocytes,UA      Negative Trace (A)  Protein,UA     Negative/Trace Negative  Glucose, UA     Negative Negative  Ketones, UA     Negative Negative  RBC, UA     Negative 2+ (A)  Bilirubin, UA     Negative Negative  Urobilinogen, Ur     0.2 - 1.0 mg/dL 0.2  Nitrite, UA     Negative Negative  Microscopic Examination      See below:   Component     Latest Ref Rng & Units 07/20/2020  WBC, UA     0 - 5 /hpf 6-10 (A)  RBC     0 - 2 /hpf 0-2  Epithelial Cells (non renal)     0 - 10 /hpf 0-10  Bacteria, UA     None seen/Few None seen  I have reviewed the labs.  Pertinent Imaging Narrative & Impression  CLINICAL DATA:  Renal abscess.  EXAM: RENAL / URINARY TRACT ULTRASOUND COMPLETE  COMPARISON:  May 30, 2020.  FINDINGS: Right Kidney:  Renal measurements: 12.6 x 7.2 x 6.3 cm = volume: 297 mL. Echogenicity within normal limits. No mass or hydronephrosis visualized.  Left Kidney:  Renal measurements: 12.6 x 7.2 x 6.3 cm = volume: 297 mL. Echogenicity within normal limits. 2 cm simple cyst is seen in upper pole. There is continued presence of thick walled rounded cystic lesion arising from the lower pole with maximum measured diameter of 7.9 cm consistent with history of renal abscess. No hydronephrosis visualized.  Bladder:  Appears normal for degree of bladder distention. Bilateral ureteral jets are noted.  Other:  None.  IMPRESSION: Continued presence of 7.9 cm thick walled rounded cystic lesion arising from lower pole of left kidney consistent with history of renal abscess.   Electronically Signed   By: Lupita Raider M.D.   On: 07/20/2020 09:43   I have independently reviewed the films.  See HPI.   Assessment & Plan:   1. Left renal abscess - still present on yesterday's RUS - will have patient RTC in three months for renal MRI report with Dr. Apolinar Junes - UA unremarkable  Return in about 3 months (around 10/19/2020) for MRI report with Dr. Apolinar Junes  .  These notes generated with voice recognition software. I apologize for typographical errors.  Michiel Cowboy, PA-C  [facility] [address]  [address] [place], [place] [address] [phone]  I spent 20 minutes on the day of the encounter to include pre-visit record review, face-to-face time with the patient,  and post-visit ordering of tests.

## 2020-07-20 ENCOUNTER — Ambulatory Visit (INDEPENDENT_AMBULATORY_CARE_PROVIDER_SITE_OTHER): Payer: Medicare Other | Admitting: Urology

## 2020-07-20 ENCOUNTER — Encounter: Payer: Self-pay | Admitting: Urology

## 2020-07-20 VITALS — BP 134/71 | HR 106 | Ht 76.0 in | Wt 262.0 lb

## 2020-07-20 DIAGNOSIS — N2889 Other specified disorders of kidney and ureter: Secondary | ICD-10-CM

## 2020-07-20 DIAGNOSIS — N151 Renal and perinephric abscess: Secondary | ICD-10-CM | POA: Diagnosis not present

## 2020-07-21 LAB — URINALYSIS, COMPLETE
Bilirubin, UA: NEGATIVE
Glucose, UA: NEGATIVE
Ketones, UA: NEGATIVE
Nitrite, UA: NEGATIVE
Protein,UA: NEGATIVE
Specific Gravity, UA: 1.01 (ref 1.005–1.030)
Urobilinogen, Ur: 0.2 mg/dL (ref 0.2–1.0)
pH, UA: 5.5 (ref 5.0–7.5)

## 2020-07-21 LAB — MICROSCOPIC EXAMINATION: Bacteria, UA: NONE SEEN

## 2020-08-22 DIAGNOSIS — Z1211 Encounter for screening for malignant neoplasm of colon: Secondary | ICD-10-CM | POA: Diagnosis not present

## 2020-08-22 LAB — COLOGUARD: Cologuard: NEGATIVE

## 2020-09-12 LAB — EXTERNAL GENERIC LAB PROCEDURE: COLOGUARD: NEGATIVE

## 2020-09-12 LAB — COLOGUARD: COLOGUARD: NEGATIVE

## 2020-09-13 ENCOUNTER — Other Ambulatory Visit: Payer: Self-pay

## 2020-09-13 ENCOUNTER — Ambulatory Visit: Payer: Self-pay

## 2020-09-13 ENCOUNTER — Ambulatory Visit (INDEPENDENT_AMBULATORY_CARE_PROVIDER_SITE_OTHER): Payer: Medicare Other | Admitting: Family Medicine

## 2020-09-13 ENCOUNTER — Encounter: Payer: Self-pay | Admitting: Family Medicine

## 2020-09-13 VITALS — BP 104/86 | HR 97 | Temp 98.5°F | Resp 20 | Ht 76.0 in | Wt 259.4 lb

## 2020-09-13 DIAGNOSIS — H6691 Otitis media, unspecified, right ear: Secondary | ICD-10-CM | POA: Insufficient documentation

## 2020-09-13 DIAGNOSIS — R591 Generalized enlarged lymph nodes: Secondary | ICD-10-CM

## 2020-09-13 MED ORDER — AMOXICILLIN-POT CLAVULANATE 875-125 MG PO TABS: 1.0000 | ORAL_TABLET | Freq: Two times a day (BID) | ORAL | 0 refills | Status: DC

## 2020-09-13 NOTE — Assessment & Plan Note (Signed)
Right sided submental lymphadenopathy x 1, painful, non-mobile, likely secondary to ear infection, but will have patient follow up if not responsive to augmentin treatment for right otitis media.  Reports present x few days.  Patient in agreement with plan and if needed, will order ultrasound.

## 2020-09-13 NOTE — Progress Notes (Signed)
Subjective:    Patient ID: Jesse Gardner, male    DOB: [birth year], 74 y.o.   MRN: [id number]  Abdalrahman Clementson is a 74 y.o. male presenting on 09/13/2020 for Jaw Pain (Right side ear pain, upper jaw tenderness to the touch.Swelling right under the ear lobe. x 1 day Pt state the discomfort worsen after using the jaw to chew. )   HPI  Mr. Arredondo presents to clinic for follow up on right sided ear pain and jaw pain x 1 day.  Reports swelling to the right jaw for a few days, is tender to the touch, noticed increased discomfort with chewing last evening.  Denies recent illness, fevers, ear popping/clicking, jaw popping/clicking/locking, tooth pain, recent dental procedure or infection.  Has not taken anything for his symptoms.  Depression screen [facility] 2/9 07/12/2020 01/01/2020 09/22/2019  Decreased Interest 0 0 0  Down, Depressed, Hopeless 0 0 0  PHQ - 2 Score 0 0 0    Social History   Tobacco Use  . Smoking status: Current Some Day Smoker    Packs/day: 0.25    Years: 57.00    Pack years: 14.25    Types: Cigarettes  . Smokeless tobacco: Never Used  . Tobacco comment: 1 cigarette in the last month   Vaping Use  . Vaping Use: Never used  Substance Use Topics  . Alcohol use: Yes    Alcohol/week: 2.0 standard drinks    Types: 2 Glasses of wine per week  . Drug use: Never    Review of Systems  Constitutional: Negative.   HENT: Positive for ear pain. Negative for congestion, dental problem, drooling, ear discharge, hearing loss, mouth sores, nosebleeds, postnasal drip, rhinorrhea, sinus pressure, sinus pain, sneezing, sore throat, tinnitus, trouble swallowing and voice change.        Right sided jaw pain  Eyes: Negative.   Respiratory: Negative.   Cardiovascular: Negative.   Gastrointestinal: Negative.   Endocrine: Negative.   Genitourinary: Negative.   Musculoskeletal: Negative.   Skin: Negative.   Allergic/Immunologic: Negative.   Neurological: Negative.   Hematological: Positive  for adenopathy. Does not bruise/bleed easily.  Psychiatric/Behavioral: Negative.    Per HPI unless specifically indicated above     Objective:    BP 104/86 (BP Location: Left Arm, Patient Position: Sitting, Cuff Size: Large)   Pulse 97   Temp 98.5 F (36.9 C) (Oral)   Resp 20   Ht 6\' 4"  (1.93 m)   Wt 259 lb 6.4 oz (117.7 kg)   SpO2 95%   BMI 31.58 kg/m   Wt Readings from Last 3 Encounters:  09/13/20 259 lb 6.4 oz (117.7 kg)  07/20/20 262 lb (118.8 kg)  07/12/20 262 lb (118.8 kg)    Physical Exam Vitals and nursing note reviewed.  Constitutional:      General: He is not in acute distress.    Appearance: Normal appearance. He is well-developed and well-groomed. He is obese. He is not ill-appearing or toxic-appearing.  HENT:     Head: Normocephalic and atraumatic.     Right Ear: Ear canal and external ear normal. No drainage, swelling or tenderness. There is no impacted cerumen. Tympanic membrane is injected, erythematous and bulging. Tympanic membrane is not perforated.     Left Ear: Tympanic membrane, ear canal and external ear normal. There is no impacted cerumen.     Nose: Nose normal.     Comments: Facemask is in place, covering mouth and nose.    Mouth/Throat:  Mouth: Mucous membranes are moist.     Pharynx: No oropharyngeal exudate.  Eyes:     General:        Right eye: No discharge.        Left eye: No discharge.     Extraocular Movements: Extraocular movements intact.     Conjunctiva/sclera: Conjunctivae normal.     Pupils: Pupils are equal, round, and reactive to light.  Cardiovascular:     Rate and Rhythm: Normal rate and regular rhythm.     Pulses: Normal pulses.     Heart sounds: Normal heart sounds. No murmur heard.  No friction rub. No gallop.   Pulmonary:     Effort: Pulmonary effort is normal. No respiratory distress.     Breath sounds: Normal breath sounds.  Musculoskeletal:     Right lower leg: No edema.     Left lower leg: No edema.   Lymphadenopathy:     Head:     Right side of head: Submental adenopathy present. No submandibular, tonsillar, preauricular, posterior auricular or occipital adenopathy.     Left side of head: No submental, submandibular, tonsillar, preauricular, posterior auricular or occipital adenopathy.     Cervical:     Right cervical: No superficial or posterior cervical adenopathy.    Left cervical: No superficial or posterior cervical adenopathy.     Comments: Right sided submental lymphadenopathy x 1, large, tender, non-mobile  Skin:    General: Skin is warm and dry.     Capillary Refill: Capillary refill takes less than 2 seconds.  Neurological:     General: No focal deficit present.     Mental Status: He is alert and oriented to person, place, and time.  Psychiatric:        Attention and Perception: Attention and perception normal.        Mood and Affect: Mood and affect normal.        Speech: Speech normal.        Behavior: Behavior normal. Behavior is cooperative.        Thought Content: Thought content normal.        Cognition and Memory: Cognition and memory normal.    Results for orders placed or performed in visit on 07/20/20  Microscopic Examination   Urine  Result Value Ref Range   WBC, UA 6-10 (A) 0 - 5 /hpf   RBC 0-2 0 - 2 /hpf   Epithelial Cells (non renal) 0-10 0 - 10 /hpf   Bacteria, UA None seen None seen/Few  Urinalysis, Complete  Result Value Ref Range   Specific Gravity, UA 1.010 1.005 - 1.030   pH, UA 5.5 5.0 - 7.5   Color, UA Yellow Yellow   Appearance Ur Clear Clear   Leukocytes,UA Trace (A) Negative   Protein,UA Negative Negative/Trace   Glucose, UA Negative Negative   Ketones, UA Negative Negative   RBC, UA 2+ (A) Negative   Bilirubin, UA Negative Negative   Urobilinogen, Ur 0.2 0.2 - 1.0 mg/dL   Nitrite, UA Negative Negative   Microscopic Examination See below:       Assessment & Plan:   Problem List Items Addressed This Visit      Nervous and  Auditory   Right otitis media - Primary    Right otitis media without TM rupture.  Will treat with augmentin 875-125mg  BID x 10 days.  Encouraged to take with food to avoid GI upset.   Plan: 1. Begin augmentin 875-125mg  BID x 10 days  2. RTC PRN      Relevant Medications   amoxicillin-clavulanate (AUGMENTIN) 875-125 MG tablet     Immune and Lymphatic   Lymphadenopathy of head and neck    Right sided submental lymphadenopathy x 1, painful, non-mobile, likely secondary to ear infection, but will have patient follow up if not responsive to augmentin treatment for right otitis media.  Reports present x few days.  Patient in agreement with plan and if needed, will order ultrasound.         Meds ordered this encounter  Medications  . amoxicillin-clavulanate (AUGMENTIN) 875-125 MG tablet    Sig: Take 1 tablet by mouth 2 (two) times daily.    Dispense:  20 tablet    Refill:  0    Follow up plan: Return if symptoms worsen or fail to improve.   Charlaine Dalton, FNP Family Nurse Practitioner [facility] 09/13/2020, 10:39 AM

## 2020-09-13 NOTE — Patient Instructions (Signed)
I have sent in a prescription for Augmentin 875-125mg  to take 1 tablet 2x per day for the next 10 days.  This will help clear up the ear infection.  If you find that you still have worsened right jaw swelling/discomfort, to follow up with our office and we may order an ultrasound.  We will plan to see you back if your symptoms worsen or fail to improve  You will receive a survey after today's visit either digitally by e-mail or paper by USPS mail. Your experiences and feedback matter to [place].  Please respond so we know how we are doing as we provide care for you.  Call us with any questions/concerns/needs.  It is my goal to be available to you for your health concerns.  Thanks for choosing me to be a partner in your healthcare needs!  Charlaine Dalton, FNP-C Family Nurse Practitioner [facility] Phone: [phone]

## 2020-09-13 NOTE — Assessment & Plan Note (Signed)
Right otitis media without TM rupture.  Will treat with augmentin 875-125mg  BID x 10 days.  Encouraged to take with food to avoid GI upset.   Plan: 1. Begin augmentin 875-125mg  BID x 10 days 2. RTC PRN

## 2020-09-13 NOTE — Telephone Encounter (Signed)
Onset yesterday (1800) after eating, pt noted right side of jaw area was hurting and tender to touch. Pain is 1/10 constantly. After eating pain is 6/10. Pt stated the "lump" is  3.5 inches long by 1.25 wide and located on the  right side below ear and on jaw line.  No fever, toothache or fever. Pt c/o sinus congestion and post nasal drip. Pt stated right ear has been itching for 1 week and has been tender to touch. Pt stated he has muffled hearing to the right ear until it "pops" and then his hearing improves.  Using Advil and Rx Flonase which helps somewhat. Nasal discharge  color is yellow to  brown. Muffled fearing and popping   Care advice given and pt verbalized understanding. Pt's PCP has full schedule. Called office and spoke with Fleet Contras who stated that pt can be schedule with Danielle Rankin FNP for acute issues. Pt accepted appt for 1000 for evaluation.   Reason for Disposition . [1] Swollen area of face AND [2] is painful to touch  Answer Assessment - Initial Assessment Questions 1. ONSET: "When did the pain start?" (e.g., minutes, hours, days)     Yesterday 6 pm 2. ONSET: "Does the pain come and go, or has it been constant since it started?" (e.g., constant, intermittent, fleeting)     No pain at present but to touch sore  3. SEVERITY: "How bad is the pain?"   (Scale 1-10; mild, moderate or severe)   - MILD (1-3): doesn't interfere with normal activities    - MODERATE (4-7): interferes with normal activities or awakens from sleep    - SEVERE (8-10): excruciating pain, unable to do any normal activities      4. LOCATION: "Where does it hurt?"     3/10 touch tender lump is hard 5. RASH: "Is there any redness, rash, or swelling of the face?"     Has beard, mild swelling 6. FEVER: "Do you have a fever?" If Yes, ask: "What is it, how was it measured, and when did it start?"      no 7. OTHER SYMPTOMS: "Do you have any other symptoms?" (e.g., fever, toothache, nasal discharge, nasal  congestion, clicking sensation in jaw joint)    Nasal discharge, nasal congestion 8. PREGNANCY: "Is there any chance you are pregnant?" "When was your last menstrual period?"     n/a  Protocols used: FACE PAIN-A-AH

## 2020-09-14 ENCOUNTER — Encounter: Payer: Self-pay | Admitting: Family Medicine

## 2020-09-27 ENCOUNTER — Ambulatory Visit (INDEPENDENT_AMBULATORY_CARE_PROVIDER_SITE_OTHER): Payer: Medicare Other

## 2020-09-27 VITALS — Ht 76.0 in | Wt 259.0 lb

## 2020-09-27 DIAGNOSIS — Z0000 Encounter for general adult medical examination without abnormal findings: Secondary | ICD-10-CM | POA: Diagnosis not present

## 2020-09-27 NOTE — Progress Notes (Signed)
I connected with Jesse Gardner today by telephone and verified that I am speaking with the correct person using two identifiers. Location patient: home Location provider: work Persons participating in the virtual visit: Jesse Gardner, Jesse Ponder LPN.   I discussed the limitations, risks, security and privacy concerns of performing an evaluation and management service by telephone and the availability of in person appointments. I also discussed with the patient that there may be a patient responsible charge related to this service. The patient expressed understanding and verbally consented to this telephonic visit.    Interactive audio and video telecommunications were attempted between this provider and patient, however failed, due to patient having technical difficulties OR patient did not have access to video capability.  We continued and completed visit with audio only.     Vital signs may be patient reported or missing.  Subjective:   Jesse Gardner is a 74 y.o. male who presents for Medicare Annual/Subsequent preventive examination.  Review of Systems     Cardiac Risk Factors include: advanced age (>41men, >63 women);hypertension;male gender;obesity (BMI >30kg/m2);smoking/ tobacco exposure;sedentary lifestyle     Objective:    Today's Vitals   09/27/20 0912 09/27/20 0914  Weight: 259 lb (117.5 kg)   Height: 6\' 4"  (1.93 m)   PainSc:  6    Body mass index is 31.53 kg/m.  Advanced Directives 09/27/2020 09/22/2019  Does Patient Have a Medical Advance Directive? No No  Would patient like information on creating a medical advance directive? - No - Patient declined    Current Medications (verified) Outpatient Encounter Medications as of 09/27/2020  Medication Sig  . fluticasone (FLONASE) 50 MCG/ACT nasal spray Place 2 sprays into both nostrils daily. Use for 4-6 weeks then stop and use seasonally or as needed.  09/29/2020 losartan (COZAAR) 50 MG tablet Take 1 tablet (50 mg total) by  mouth daily.  . Melatonin 5 MG CAPS Take by mouth.   . Misc Natural Products (OSTEO BI-FLEX TRIPLE STRENGTH PO) Take by mouth.  . naproxen sodium (ALEVE) 220 MG tablet Take 440 mg by mouth daily as needed.  . rosuvastatin (CRESTOR) 10 MG tablet Take 1 tablet (10 mg total) by mouth at bedtime.  Marland Kitchen amoxicillin-clavulanate (AUGMENTIN) 875-125 MG tablet Take 1 tablet by mouth 2 (two) times daily. (Patient not taking: Reported on 09/27/2020)  . [DISCONTINUED] losartan (COZAAR) 50 MG tablet TAKE 1 TABLET(50 MG) BY MOUTH DAILY   No facility-administered encounter medications on file as of 09/27/2020.    Allergies (verified) Patient has no known allergies.   History: Past Medical History:  Diagnosis Date  . Hypertension    Past Surgical History:  Procedure Laterality Date  . APPENDECTOMY    . HERNIA REPAIR     Family History  Problem Relation Age of Onset  . Alzheimer's disease Mother   . Diabetes Father   . Prostate cancer Brother    Social History   Socioeconomic History  . Marital status: Significant Other    Spouse name: Not on file  . Number of children: Not on file  . Years of education: Not on file  . Highest education level: Not on file  Occupational History  . Occupation: retired  Tobacco Use  . Smoking status: Current Some Day Smoker    Packs/day: 0.25    Years: 57.00    Pack years: 14.25    Types: Cigarettes  . Smokeless tobacco: Never Used  . Tobacco comment: 1 cigarette in the last month   Vaping Use  .  Vaping Use: Never used  Substance and Sexual Activity  . Alcohol use: Yes    Alcohol/week: 2.0 standard drinks    Types: 2 Glasses of wine per week  . Drug use: Never  . Sexual activity: Not on file  Other Topics Concern  . Not on file  Social History Narrative  . Not on file   Social Determinants of Health   Financial Resource Strain: Low Risk   . Difficulty of Paying Living Expenses: Not hard at all  Food Insecurity: No Food Insecurity  . Worried  About Programme researcher, broadcasting/film/video in the Last Year: Never true  . Ran Out of Food in the Last Year: Never true  Transportation Needs: No Transportation Needs  . Lack of Transportation (Medical): No  . Lack of Transportation (Non-Medical): No  Physical Activity: Inactive  . Days of Exercise per Week: 0 days  . Minutes of Exercise per Session: 0 min  Stress: No Stress Concern Present  . Feeling of Stress : Only a little  Social Connections:   . Frequency of Communication with Friends and Family: Not on file  . Frequency of Social Gatherings with Friends and Family: Not on file  . Attends Religious Services: Not on file  . Active Member of Clubs or Organizations: Not on file  . Attends Banker Meetings: Not on file  . Marital Status: Not on file    Tobacco Counseling Ready to quit: Yes Counseling given: Not Answered Comment: 1 cigarette in the last month    Clinical Intake:  Pre-visit preparation completed: Yes  Pain : 0-10 Pain Score: 6  Pain Type: Chronic pain Pain Orientation: Right, Left Pain Descriptors / Indicators: Aching Pain Onset: More than a month ago Pain Frequency: Constant     Nutritional Status: BMI > 30  Obese Diabetes: No  How often do you need to have someone help you when you read instructions, pamphlets, or other written materials from your doctor or pharmacy?: 1 - Never What is the last grade level you completed in school?: associates degree  Diabetic? no  Interpreter Needed?: No  Information entered by :: NAllen LPN   Activities of Daily Living In your present state of health, do you have any difficulty performing the following activities: 09/27/2020 06/02/2020  Hearing? Y N  Vision? N N  Difficulty concentrating or making decisions? Y N  Comment memory -  Walking or climbing stairs? Y N  Comment due to knees -  Dressing or bathing? N N  Doing errands, shopping? N -  Preparing Food and eating ? N -  Using the Toilet? N -  In the  past six months, have you accidently leaked urine? Y -  Do you have problems with loss of bowel control? N -  Managing your Medications? N -  Managing your Finances? N -  Housekeeping or managing your Housekeeping? N -  Some recent data might be hidden    Patient Care Team: Smitty Cords, DO as PCP - General (Family Medicine)  Indicate any recent Medical Services you may have received from other than [facility] providers in the past year (date may be approximate).     Assessment:   This is a routine wellness examination for Kreed.  Hearing/Vision screen  Hearing Screening   125Hz  250Hz  500Hz  1000Hz  2000Hz  3000Hz  4000Hz  6000Hz  8000Hz   Right ear:           Left ear:  Vision Screening Comments: No regular eye exams, [facility]  Dietary issues and exercise activities discussed: Current Exercise Habits: The patient does not participate in regular exercise at present  Goals    . Patient Stated     09/27/2020, stay alive    . Quit Smoking      Depression Screen PHQ 2/9 Scores 09/27/2020 07/12/2020 01/01/2020 09/22/2019 07/03/2019 10/24/2018  PHQ - 2 Score 1 0 0 0 0 0    Fall Risk Fall Risk  09/27/2020 07/12/2020 07/12/2020 01/01/2020 09/22/2019  Falls in the past year? 0 0 0 0 0  Number falls in past yr: - 0 0 0 0  Injury with Fall? - 0 0 0 0  Risk for fall due to : Medication side effect - - - -  Follow up Falls evaluation completed;Education provided;Falls prevention discussed Falls evaluation completed Falls evaluation completed Falls evaluation completed -    Any stairs in or around the home? Yes  If so, are there any without handrails? No  Home free of loose throw rugs in walkways, pet beds, electrical cords, etc? Yes  Adequate lighting in your home to reduce risk of falls? Yes   ASSISTIVE DEVICES UTILIZED TO PREVENT FALLS:  Life alert? No  Use of a cane, walker or w/c? No  Grab bars in the bathroom? No  Shower chair or bench in shower? No  Elevated  toilet seat or a handicapped toilet? No   TIMED UP AND GO:  Was the test performed? No .   Cognitive Function:     6CIT Screen 09/27/2020 09/22/2019  What Year? 0 points 0 points  What month? 0 points 0 points  What time? 0 points 0 points  Count back from 20 0 points 0 points  Months in reverse 4 points 0 points  Repeat phrase 2 points 0 points  Total Score 6 0    Immunizations Immunization History  Administered Date(s) Administered  . Fluad Quad(high Dose 65+) 07/03/2019, 07/12/2020  . Influenza, High Dose Seasonal PF 10/24/2018  . PFIZER SARS-COV-2 Vaccination 12/30/2019, 01/20/2020    TDAP status: Due, Education has been provided regarding the importance of this vaccine. Advised may receive this vaccine at local pharmacy or Health Dept. Aware to provide a copy of the vaccination record if obtained from local pharmacy or Health Dept. Verbalized acceptance and understanding. Flu Vaccine status: Up to date Pneumococcal vaccine status: Declined,  Education has been provided regarding the importance of this vaccine but patient still declined. Advised may receive this vaccine at local pharmacy or Health Dept. Aware to provide a copy of the vaccination record if obtained from local pharmacy or Health Dept. Verbalized acceptance and understanding.  Covid-19 vaccine status: Completed vaccines  Qualifies for Shingles Vaccine? Yes   Zostavax completed No   Shingrix Completed?: No.    Education has been provided regarding the importance of this vaccine. Patient has been advised to call insurance company to determine out of pocket expense if they have not yet received this vaccine. Advised may also receive vaccine at local pharmacy or Health Dept. Verbalized acceptance and understanding.  Screening Tests Health Maintenance  Topic Date Due  . TETANUS/TDAP  Never done  . PNA vac Low Risk Adult (1 of 2 - PCV13) Never done  . Fecal DNA (Cologuard)  08/23/2023  . INFLUENZA VACCINE   Completed  . COVID-19 Vaccine  Completed  . Hepatitis C Screening  Completed    Health Maintenance  Health Maintenance Due  Topic Date Due  .  TETANUS/TDAP  Never done  . PNA vac Low Risk Adult (1 of 2 - PCV13) Never done    Colorectal cancer screening: Completed cologuard 08/22/2020. Repeat every 3 years  Lung Cancer Screening: (Low Dose CT Chest recommended if Age 20-80 years, 30 pack-year currently smoking OR have quit w/in 15years.) does not qualify.   Lung Cancer Screening Referral: no  Additional Screening:  Hepatitis C Screening: does qualify; Completed 01/19/2019  Vision Screening: Recommended annual ophthalmology exams for early detection of glaucoma and other disorders of the eye. Is the patient up to date with their annual eye exam?  No  Who is the provider or what is the name of the office in which the patient attends annual eye exams? [facility] If pt is not established with a provider, would they like to be referred to a provider to establish care? No .   Dental Screening: Recommended annual dental exams for proper oral hygiene  Community Resource Referral / Chronic Care Management: CRR required this visit?  No   CCM required this visit?  No      Plan:     I have personally reviewed and noted the following in the patient's chart:   . Medical and social history . Use of alcohol, tobacco or illicit drugs  . Current medications and supplements . Functional ability and status . Nutritional status . Physical activity . Advanced directives . List of other physicians . Hospitalizations, surgeries, and ER visits in previous 12 months . Vitals . Screenings to include cognitive, depression, and falls . Referrals and appointments  In addition, I have reviewed and discussed with patient certain preventive protocols, quality metrics, and best practice recommendations. A written personalized care plan for preventive services as well as general preventive health  recommendations were provided to patient.     Barb Merino, LPN   [id number]   Nurse Notes:

## 2020-09-27 NOTE — Patient Instructions (Signed)
Jesse Gardner , Thank you for taking time to come for your Medicare Wellness Visit. I appreciate your ongoing commitment to your health goals. Please review the following plan we discussed and let me know if I can assist you in the future.   Screening recommendations/referrals: Colonoscopy: cologuard 08/22/2020, due 08/23/2023 Recommended yearly ophthalmology/optometry visit for glaucoma screening and checkup Recommended yearly dental visit for hygiene and checkup  Vaccinations: Influenza vaccine: completed 07/12/2020, due 05/29/2021 Pneumococcal vaccine: decline Tdap vaccine: due Shingles vaccine: discussed   Covid-19:  01/20/2020, 12/30/2019  Advanced directives: Advance directive discussed with you today.    Conditions/risks identified: smoking  Next appointment: Follow up in one year for your annual wellness visit.   Preventive Care 74 Years and Older, Male Preventive care refers to lifestyle choices and visits with your health care provider that can promote health and wellness. What does preventive care include?  A yearly physical exam. This is also called an annual well check.  Dental exams once or twice a year.  Routine eye exams. Ask your health care provider how often you should have your eyes checked.  Personal lifestyle choices, including:  Daily care of your teeth and gums.  Regular physical activity.  Eating a healthy diet.  Avoiding tobacco and drug use.  Limiting alcohol use.  Practicing safe sex.  Taking low doses of aspirin every day.  Taking vitamin and mineral supplements as recommended by your health care provider. What happens during an annual well check? The services and screenings done by your health care provider during your annual well check will depend on your age, overall health, lifestyle risk factors, and family history of disease. Counseling  Your health care provider may ask you questions about your:  Alcohol use.  Tobacco use.  Drug  use.  Emotional well-being.  Home and relationship well-being.  Sexual activity.  Eating habits.  History of falls.  Memory and ability to understand (cognition).  Work and work Astronomer. Screening  You may have the following tests or measurements:  Height, weight, and BMI.  Blood pressure.  Lipid and cholesterol levels. These may be checked every 5 years, or more frequently if you are over 70 years old.  Skin check.  Lung cancer screening. You may have this screening every year starting at age 74 if you have a 30-pack-year history of smoking and currently smoke or have quit within the past 15 years.  Fecal occult blood test (FOBT) of the stool. You may have this test every year starting at age 74.  Flexible sigmoidoscopy or colonoscopy. You may have a sigmoidoscopy every 5 years or a colonoscopy every 10 years starting at age 74.  Prostate cancer screening. Recommendations will vary depending on your family history and other risks.  Hepatitis C blood test.  Hepatitis B blood test.  Sexually transmitted disease (STD) testing.  Diabetes screening. This is done by checking your blood sugar (glucose) after you have not eaten for a while (fasting). You may have this done every 1-3 years.  Abdominal aortic aneurysm (AAA) screening. You may need this if you are a current or former smoker.  Osteoporosis. You may be screened starting at age 74 if you are at high risk. Talk with your health care provider about your test results, treatment options, and if necessary, the need for more tests. Vaccines  Your health care provider may recommend certain vaccines, such as:  Influenza vaccine. This is recommended every year.  Tetanus, diphtheria, and acellular pertussis (Tdap, Td) vaccine.  You may need a Td booster every 10 years.  Zoster vaccine. You may need this after age 29.  Pneumococcal 13-valent conjugate (PCV13) vaccine. One dose is recommended after age  74.  Pneumococcal polysaccharide (PPSV23) vaccine. One dose is recommended after age 74. Talk to your health care provider about which screenings and vaccines you need and how often you need them. This information is not intended to replace advice given to you by your health care provider. Make sure you discuss any questions you have with your health care provider. Document Released: 11/11/2015 Document Revised: 07/04/2016 Document Reviewed: 08/16/2015 Elsevier Interactive Patient Education  2017 [place] Prevention in the Home Falls can cause injuries. They can happen to people of all ages. There are many things you can do to make your home safe and to help prevent falls. What can I do on the outside of my home?  Regularly fix the edges of walkways and driveways and fix any cracks.  Remove anything that might make you trip as you walk through a door, such as a raised step or threshold.  Trim any bushes or trees on the path to your home.  Use bright outdoor lighting.  Clear any walking paths of anything that might make someone trip, such as rocks or tools.  Regularly check to see if handrails are loose or broken. Make sure that both sides of any steps have handrails.  Any raised decks and porches should have guardrails on the edges.  Have any leaves, snow, or ice cleared regularly.  Use sand or salt on walking paths during winter.  Clean up any spills in your garage right away. This includes oil or grease spills. What can I do in the bathroom?  Use night lights.  Install grab bars by the toilet and in the tub and shower. Do not use towel bars as grab bars.  Use non-skid mats or decals in the tub or shower.  If you need to sit down in the shower, use a plastic, non-slip stool.  Keep the floor dry. Clean up any water that spills on the floor as soon as it happens.  Remove soap buildup in the tub or shower regularly.  Attach bath mats securely with double-sided  non-slip rug tape.  Do not have throw rugs and other things on the floor that can make you trip. What can I do in the bedroom?  Use night lights.  Make sure that you have a light by your bed that is easy to reach.  Do not use any sheets or blankets that are too big for your bed. They should not hang down onto the floor.  Have a firm chair that has side arms. You can use this for support while you get dressed.  Do not have throw rugs and other things on the floor that can make you trip. What can I do in the kitchen?  Clean up any spills right away.  Avoid walking on wet floors.  Keep items that you use a lot in easy-to-reach places.  If you need to reach something above you, use a strong step stool that has a grab bar.  Keep electrical cords out of the way.  Do not use floor polish or wax that makes floors slippery. If you must use wax, use non-skid floor wax.  Do not have throw rugs and other things on the floor that can make you trip. What can I do with my stairs?  Do not leave any  items on the stairs.  Make sure that there are handrails on both sides of the stairs and use them. Fix handrails that are broken or loose. Make sure that handrails are as long as the stairways.  Check any carpeting to make sure that it is firmly attached to the stairs. Fix any carpet that is loose or worn.  Avoid having throw rugs at the top or bottom of the stairs. If you do have throw rugs, attach them to the floor with carpet tape.  Make sure that you have a light switch at the top of the stairs and the bottom of the stairs. If you do not have them, ask someone to add them for you. What else can I do to help prevent falls?  Wear shoes that:  Do not have high heels.  Have rubber bottoms.  Are comfortable and fit you well.  Are closed at the toe. Do not wear sandals.  If you use a stepladder:  Make sure that it is fully opened. Do not climb a closed stepladder.  Make sure that both  sides of the stepladder are locked into place.  Ask someone to hold it for you, if possible.  Clearly mark and make sure that you can see:  Any grab bars or handrails.  First and last steps.  Where the edge of each step is.  Use tools that help you move around (mobility aids) if they are needed. These include:  Canes.  Walkers.  Scooters.  Crutches.  Turn on the lights when you go into a dark area. Replace any light bulbs as soon as they burn out.  Set up your furniture so you have a clear path. Avoid moving your furniture around.  If any of your floors are uneven, fix them.  If there are any pets around you, be aware of where they are.  Review your medicines with your doctor. Some medicines can make you feel dizzy. This can increase your chance of falling. Ask your doctor what other things that you can do to help prevent falls. This information is not intended to replace advice given to you by your health care provider. Make sure you discuss any questions you have with your health care provider. Document Released: 08/11/2009 Document Revised: 03/22/2016 Document Reviewed: 11/19/2014 Elsevier Interactive Patient Education  2017 [employer].

## 2020-10-04 ENCOUNTER — Other Ambulatory Visit: Payer: Medicare Other

## 2020-10-04 DIAGNOSIS — E7800 Pure hypercholesterolemia, unspecified: Secondary | ICD-10-CM

## 2020-10-05 ENCOUNTER — Other Ambulatory Visit: Payer: Self-pay

## 2020-10-05 LAB — COMPLETE METABOLIC PANEL WITH GFR
AG Ratio: 1.6 (calc) (ref 1.0–2.5)
ALT: 21 U/L (ref 9–46)
AST: 22 U/L (ref 10–35)
Albumin: 4.5 g/dL (ref 3.6–5.1)
Alkaline phosphatase (APISO): 54 U/L (ref 35–144)
BUN: 15 mg/dL (ref 7–25)
CO2: 27 mmol/L (ref 20–32)
Calcium: 9.1 mg/dL (ref 8.6–10.3)
Chloride: 104 mmol/L (ref 98–110)
Creat: 0.75 mg/dL (ref 0.70–1.18)
GFR, Est African American: 105 mL/min/{1.73_m2} (ref >=60)
GFR, Est Non African American: 90 mL/min/{1.73_m2} (ref >=60)
Globulin: 2.8 g/dL (calc) (ref 1.9–3.7)
Glucose, Bld: 98 mg/dL (ref 65–99)
Potassium: 4.1 mmol/L (ref 3.5–5.3)
Sodium: 140 mmol/L (ref 135–146)
Total Bilirubin: 0.5 mg/dL (ref 0.2–1.2)
Total Protein: 7.3 g/dL (ref 6.1–8.1)

## 2020-10-05 LAB — LIPID PANEL
Cholesterol: 164 mg/dL (ref <200)
HDL: 42 mg/dL (ref >=40)
LDL Cholesterol (Calc): 102 mg/dL (calc) — ABNORMAL HIGH
Non-HDL Cholesterol (Calc): 122 mg/dL (calc) (ref <130)
Total CHOL/HDL Ratio: 3.9 (calc) (ref <5.0)
Triglycerides: 107 mg/dL (ref <150)

## 2020-10-07 ENCOUNTER — Ambulatory Visit
Admission: RE | Admit: 2020-10-07 | Discharge: 2020-10-07 | Disposition: A | Discharge status: Home / Self Care | Payer: Medicare Other | Source: Ambulatory Visit | Attending: Urology | Admitting: Urology

## 2020-10-07 ENCOUNTER — Other Ambulatory Visit: Payer: Self-pay

## 2020-10-07 DIAGNOSIS — K760 Fatty (change of) liver, not elsewhere classified: Secondary | ICD-10-CM | POA: Diagnosis not present

## 2020-10-07 DIAGNOSIS — K5730 Diverticulosis of large intestine without perforation or abscess without bleeding: Secondary | ICD-10-CM | POA: Diagnosis not present

## 2020-10-07 DIAGNOSIS — N2889 Other specified disorders of kidney and ureter: Secondary | ICD-10-CM | POA: Insufficient documentation

## 2020-10-07 IMAGING — MR MR ABDOMEN WO/W CM
16 of 18 series · 44 of 48 positions shown · IV contrast (10ml Gadavist)
Comparison: [DATE] CT abdomen/pelvis.
COMPARISON: [DATE] CT abdomen/pelvis.

Addendum:
CLINICAL DATA: Follow-up left renal abscess status post
percutaneous drainage in [REDACTED].

EXAM:
MRI ABDOMEN WITHOUT AND WITH CONTRAST
TECHNIQUE: Multiplanar multisequence MR imaging of the abdomen was performed
both before and after the administration of intravenous contrast.
CONTRAST:  10mL GADAVIST GADOBUTROL 1 MMOL/ML IV SOLN

[Series 3: T2 · coronal · 6.0mm · 1.19mm/px · 2 of 30 slices shown (1 of 2)]
[im 1/30]
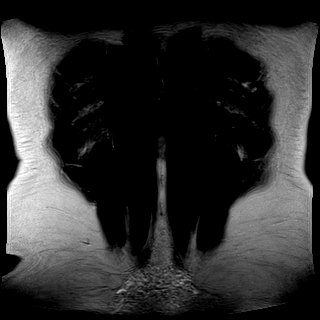
[im 30/30]
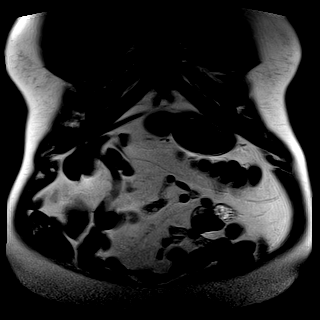

[Series 4: T2 · axial · 6.0mm · 1.19mm/px · z∈[-169,+68]mm · 2 of 34 slices shown (2 of 2)]
[im 1/34]
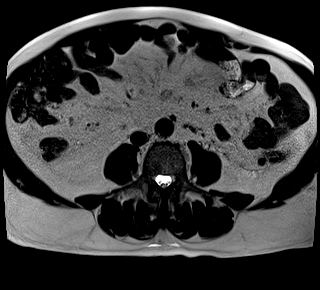
[im 34/34]
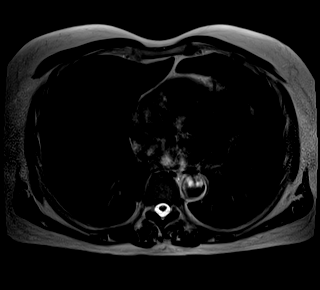

[Series 6: T2 fat-sat · axial · 6.0mm · 1.19mm/px · z∈[-175,+77]mm · 3 of 36 slices shown]
[im 1/36]
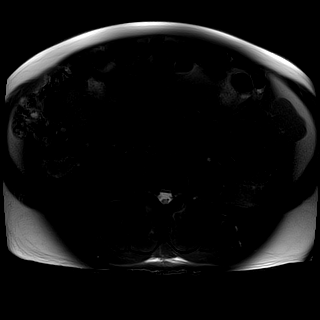
[im 18/36]
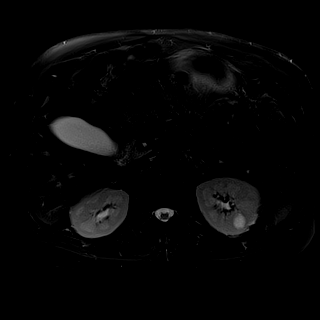
[im 36/36]
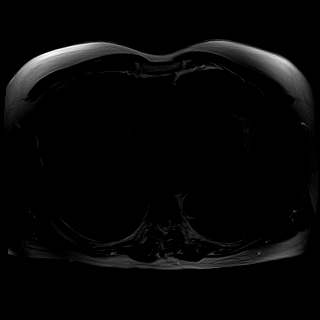

[Series 7: ax dwi_tracew · axial · 6.0mm · 1.42mm/px · z∈[-175,+77]mm · 6 of 108 slices shown]
[im 1/108]
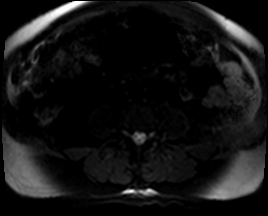
[im 22/108]
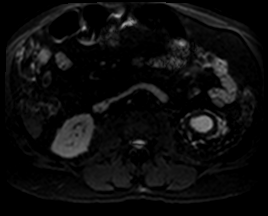
[im 43/108]
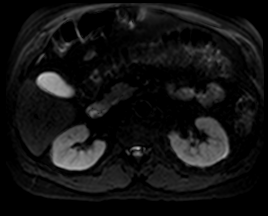
[im 65/108]
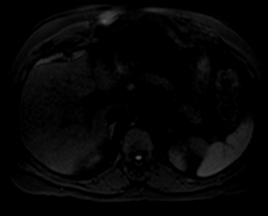
[im 86/108]
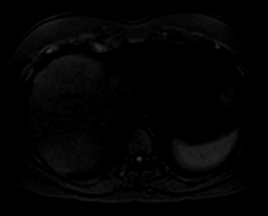
[im 108/108]
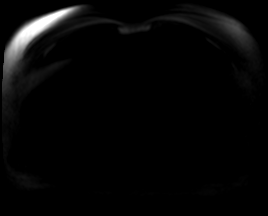

[Series 8: ax dwi_adc · axial · 6.0mm · 1.42mm/px · z∈[-175,+77]mm · 2 of 36 slices shown]
[im 1/36]
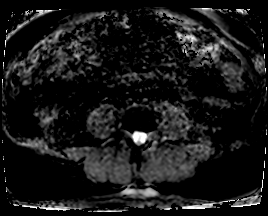
[im 36/36]
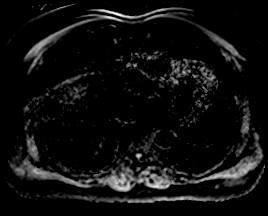

[Series 9: T1 · axial · 6.0mm · 0.74mm/px · 1 of 34 slices shown]
[im 1/34]
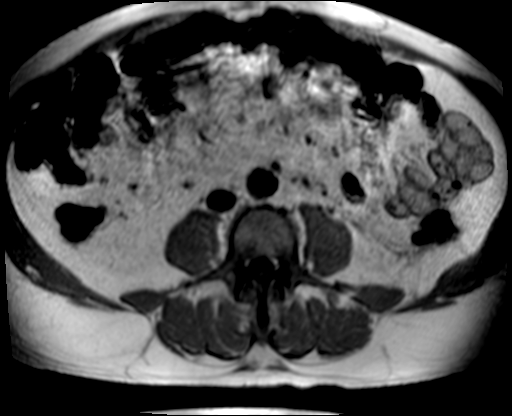

[Series 10: bSSFP · axial · 6.0mm · 0.74mm/px · 1 of 34 slices shown]
[im 1/34]
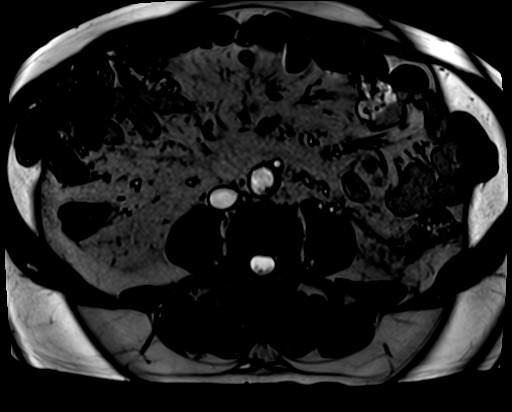

[Series 11: T1 dynamic fat-sat · axial · non-contrast · 3.2mm · 1.19mm/px · z∈[-177,+76]mm · 3 of 80 slices shown (1 of 4)]
[im 1/80]
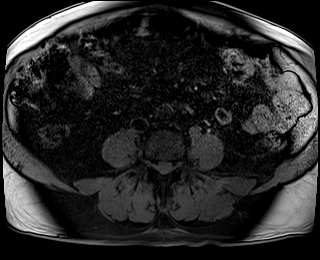
[im 40/80]
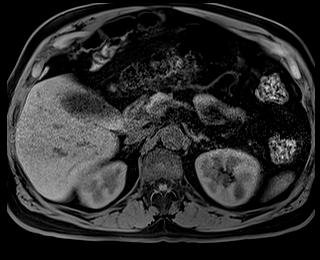
[im 80/80]
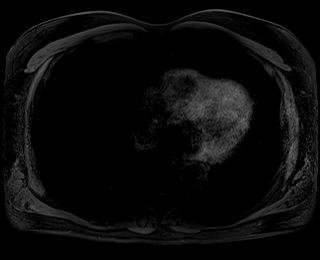

[Series 12: T1 dynamic fat-sat post-contrast · axial · 3.2mm · 1.19mm/px · z∈[-177,+76]mm · 3 of 80 slices shown (1 of 4)]
[im 1/80]
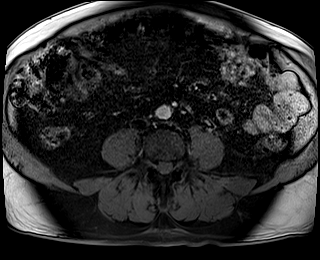
[im 40/80]
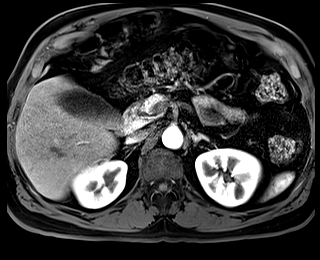
[im 80/80]
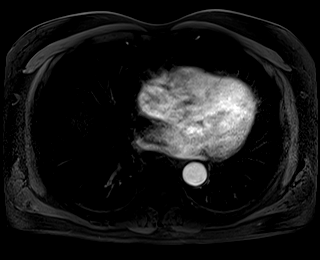

[Series 13: T1 dynamic fat-sat · axial · 3.2mm · 1.19mm/px · z∈[-177,+76]mm · 3 of 80 slices shown (2 of 4)]
[im 1/80]
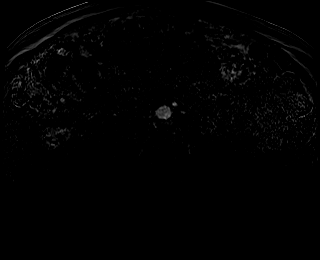
[im 40/80]
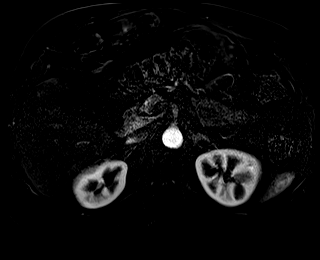
[im 80/80]
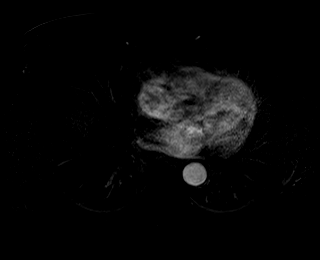

[Series 14: T1 dynamic fat-sat post-contrast · axial · 3.2mm · 1.19mm/px · z∈[-177,+76]mm · 3 of 80 slices shown (2 of 4)]
[im 1/80]
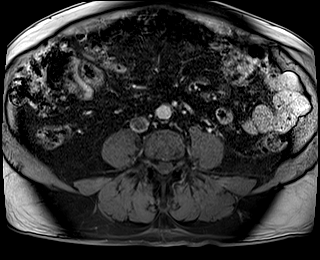
[im 40/80]
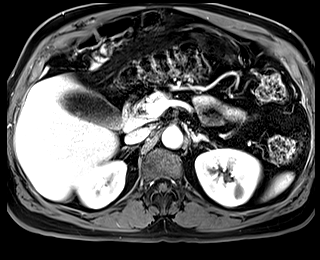
[im 80/80]
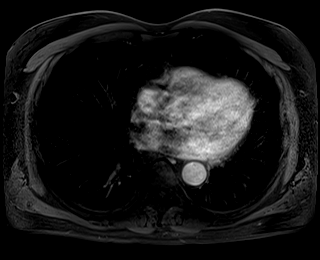

[Series 15: T1 dynamic fat-sat · axial · 3.2mm · 1.19mm/px · z∈[-177,+76]mm · 3 of 80 slices shown (3 of 4)]
[im 1/80]
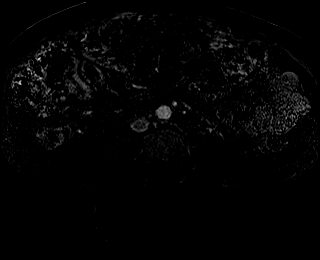
[im 40/80]
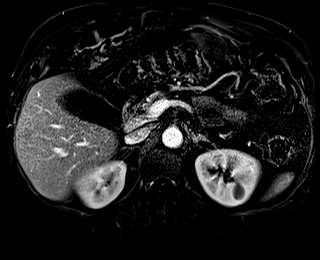
[im 80/80]
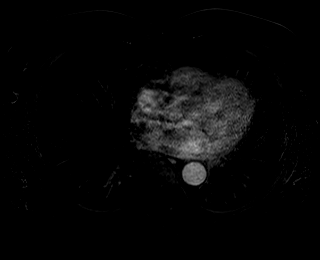

[Series 16: T1 dynamic fat-sat post-contrast · axial · 3.2mm · 1.19mm/px · z∈[-177,+76]mm · 3 of 80 slices shown (3 of 4)]
[im 1/80]
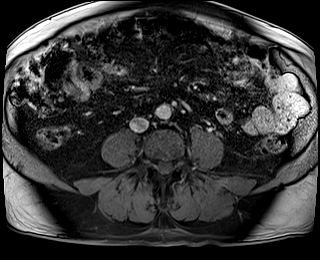
[im 40/80]
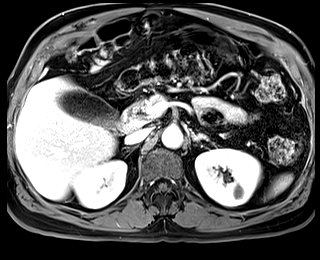
[im 80/80]
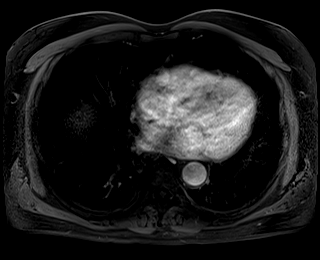

[Series 17: T1 dynamic fat-sat · axial · 3.2mm · 1.19mm/px · z∈[-177,+76]mm · 3 of 80 slices shown (4 of 4)]
[im 1/80]
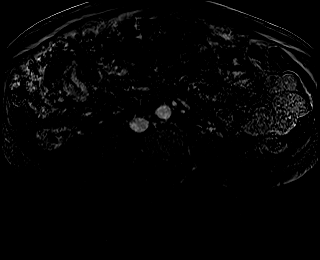
[im 40/80]
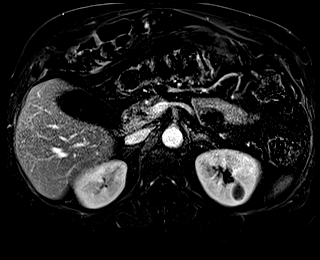
[im 80/80]
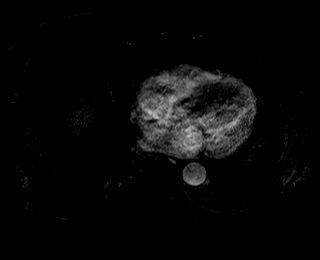

[Series 18: T1 dynamic post-contrast · coronal · 3.0mm · 1.31mm/px · 3 of 72 slices shown]
[im 1/72]
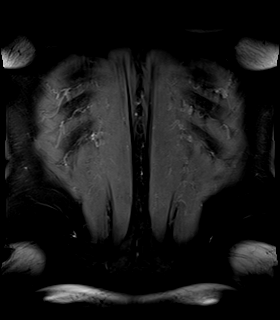
[im 36/72]
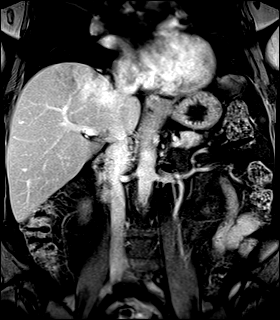
[im 72/72]
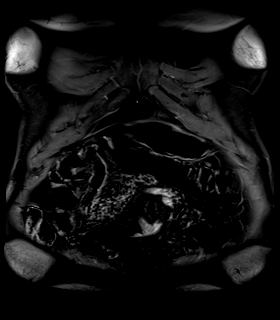

[Series 19: T1 dynamic fat-sat post-contrast · axial · 3.2mm · 1.19mm/px · z∈[-177,+76]mm · 3 of 80 slices shown (4 of 4)]
[im 1/80]
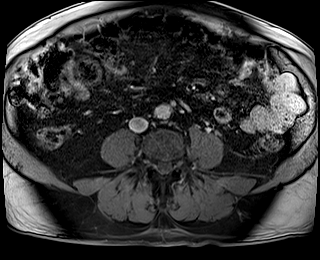
[im 40/80]
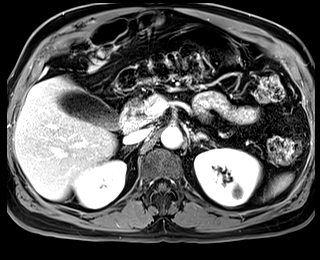
[im 80/80]
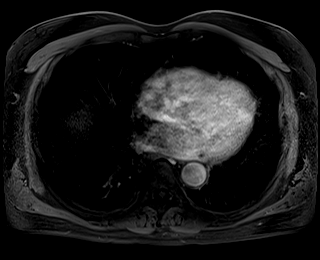

[44 of 48 positions shown; findings below may reference images not displayed]

FINDINGS: Lower chest: No acute abnormality at the lung bases.

Hepatobiliary: Normal liver size and configuration. Mild diffuse
hepatic steatosis. No liver mass. Enhancing 5 mm polyp in posterior
gallbladder wall (series 16/image 39). No gallstones. No gallbladder
wall thickening. No pericholecystic fluid. No biliary ductal
dilatation. Common bile duct diameter 4 mm. No choledocholithiasis.
No biliary masses, strictures or beading.

Pancreas: No pancreatic mass or duct dilation.  No pancreas divisum.

Spleen: Normal size. No mass.

Adrenals/Urinary Tract: Normal adrenals. No hydronephrosis. There is
a circumscribed 4.9 x 4.1 x 5.1 cm renal cortical mass in the lower
left kidney (series 4/image 29) demonstrating a thick enhancing wall
and restricted diffusion centrally, decreased from 8.8 x 8.4 x
cm on [DATE] CT. Simple 2.2 cm renal cortical cyst in the
posterior upper left kidney. Additional scattered subcentimeter
simple renal cysts in both kidneys.

Stomach/Bowel: Normal non-distended stomach. Visualized small and
large bowel is normal caliber, with no bowel wall thickening.
Scattered colonic diverticulosis.

Vascular/Lymphatic: Atherosclerotic nonaneurysmal abdominal aorta.
Patent portal, splenic, hepatic and renal veins. No pathologically
enlarged lymph nodes in the abdomen.

Other: No abdominal ascites or focal fluid collection.

Musculoskeletal: No aggressive appearing focal osseous lesions.
Right L1 vertebral hemangioma again noted.
IMPRESSION: 1. Circumscribed 4.9 x 4.1 x 5.1 cm mass in the lower left kidney
with thick enhancing wall and restricted diffusion centrally, most
compatible with a persistent or recurrent renal abscess, decreased
from 8.8 x 8.4 x 9.7 cm on [DATE] CT.
2. No abdominal lymphadenopathy.  Patent renal veins.
3. Gallbladder 5 mm polyp, requiring no follow-up. This
recommendation follows ACR consensus guidelines: White Paper of the
ACR Incidental Findings Committee II on Gallbladder and Biliary
Findings. [HOSPITAL] [T9]:;[DATE].
4. Mild diffuse hepatic steatosis.
5. Colonic diverticulosis.

ADDENDUM:
The possibility that the complex cystic mass in the lower left
kidney represents a cystic renal malignancy is difficult to exclude,
although persistent/recurrent renal abscess remains favored given
the decreased size, fairly uniform smooth margins and uniform
restricted diffusion. Follow-up MRI or CT abdomen without and with
IV contrast suggested in 3-6 months.

*** End of Addendum ***
FINDINGS: Lower chest: No acute abnormality at the lung bases.

Hepatobiliary: Normal liver size and configuration. Mild diffuse
hepatic steatosis. No liver mass. Enhancing 5 mm polyp in posterior
gallbladder wall (series 16/image 39). No gallstones. No gallbladder
wall thickening. No pericholecystic fluid. No biliary ductal
dilatation. Common bile duct diameter 4 mm. No choledocholithiasis.
No biliary masses, strictures or beading.

Pancreas: No pancreatic mass or duct dilation.  No pancreas divisum.

Spleen: Normal size. No mass.

Adrenals/Urinary Tract: Normal adrenals. No hydronephrosis. There is
a circumscribed 4.9 x 4.1 x 5.1 cm renal cortical mass in the lower
left kidney (series 4/image 29) demonstrating a thick enhancing wall
and restricted diffusion centrally, decreased from 8.8 x 8.4 x
cm on [DATE] CT. Simple 2.2 cm renal cortical cyst in the
posterior upper left kidney. Additional scattered subcentimeter
simple renal cysts in both kidneys.

Stomach/Bowel: Normal non-distended stomach. Visualized small and
large bowel is normal caliber, with no bowel wall thickening.
Scattered colonic diverticulosis.

Vascular/Lymphatic: Atherosclerotic nonaneurysmal abdominal aorta.
Patent portal, splenic, hepatic and renal veins. No pathologically
enlarged lymph nodes in the abdomen.

Other: No abdominal ascites or focal fluid collection.

Musculoskeletal: No aggressive appearing focal osseous lesions.
Right L1 vertebral hemangioma again noted.
IMPRESSION: 1. Circumscribed 4.9 x 4.1 x 5.1 cm mass in the lower left kidney
with thick enhancing wall and restricted diffusion centrally, most
compatible with a persistent or recurrent renal abscess, decreased
from 8.8 x 8.4 x 9.7 cm on [DATE] CT.
2. No abdominal lymphadenopathy.  Patent renal veins.
3. Gallbladder 5 mm polyp, requiring no follow-up. This
recommendation follows ACR consensus guidelines: White Paper of the
ACR Incidental Findings Committee II on Gallbladder and Biliary
Findings. [HOSPITAL] [T9]:;[DATE].
4. Mild diffuse hepatic steatosis.
5. Colonic diverticulosis.

## 2020-10-07 MED ORDER — GADOBUTROL 1 MMOL/ML IV SOLN
10.0000 mL | Freq: Once | INTRAVENOUS | Status: AC | PRN
  Administered 2020-10-07: 10 mL via INTRAVENOUS

## 2020-10-11 ENCOUNTER — Encounter: Payer: Self-pay | Admitting: Family Medicine

## 2020-10-11 ENCOUNTER — Encounter: Payer: Self-pay | Admitting: Urology

## 2020-10-11 ENCOUNTER — Ambulatory Visit (INDEPENDENT_AMBULATORY_CARE_PROVIDER_SITE_OTHER): Payer: Medicare Other | Admitting: Urology

## 2020-10-11 ENCOUNTER — Ambulatory Visit (INDEPENDENT_AMBULATORY_CARE_PROVIDER_SITE_OTHER): Payer: Medicare Other | Admitting: Family Medicine

## 2020-10-11 ENCOUNTER — Other Ambulatory Visit: Payer: Self-pay

## 2020-10-11 VITALS — BP 131/75 | HR 108 | Temp 97.5°F | Resp 16 | Ht 76.0 in | Wt 263.6 lb

## 2020-10-11 DIAGNOSIS — E7800 Pure hypercholesterolemia, unspecified: Secondary | ICD-10-CM | POA: Diagnosis not present

## 2020-10-11 DIAGNOSIS — N281 Cyst of kidney, acquired: Secondary | ICD-10-CM

## 2020-10-11 DIAGNOSIS — I10 Essential (primary) hypertension: Secondary | ICD-10-CM

## 2020-10-11 NOTE — Progress Notes (Signed)
Subjective:    Patient ID: Jesse Gardner, male    DOB: 1946-09-19, 74 y.o.   MRN: [id number]  Jesse Gardner is a 74 y.o. male presenting on 10/11/2020 for Hyperlipidemia   HPI   Essential Hypertension/ Obesity BMI >32 History of irregular heart rate, identified for procedure - had episodic tachycardia without arrhythmia, recently reported Home BP has been in normal range mostly He does take Sudafed for sinuses often. Sometimes raised BP Current Meds -Losartan 50mg  daily Lifestyle: - Diet:Drinks up topot of coffee daily, drinks tea. Not drinking soft drinks. - Exercise:No longer going to the gym.Limited exercise Denies CP, dyspnea, HA, edema, dizziness / lightheadedness  HYPERLIPIDEMIA: Last lipid panel 06/2020, elevated LDL 148. Last physical 06/2020 he was started on Statin therapy with Rosuvastatin 10mg  nightly Now repeat labs Lipid showed dramatic improvement from 148 to 102, and total cholesterol improved. He says not adhering to it 100%, misses a few doses. Taking Rosuvastatin 10mg  nightly, tolerating well without side effect  Did not get COVID booster yet.  Depression screen [facility] 2/9 09/27/2020 07/12/2020 01/01/2020  Decreased Interest 0 0 0  Down, Depressed, Hopeless 1 0 0  PHQ - 2 Score 1 0 0    Social History   Tobacco Use  . Smoking status: Current Some Day Smoker    Packs/day: 0.25    Years: 57.00    Pack years: 14.25    Types: Cigarettes  . Smokeless tobacco: Never Used  . Tobacco comment: 1 cigarette in the last month   Vaping Use  . Vaping Use: Never used  Substance Use Topics  . Alcohol use: Yes    Alcohol/week: 2.0 standard drinks    Types: 2 Glasses of wine per week  . Drug use: Never    Review of Systems Per HPI unless specifically indicated above     Objective:    BP 131/75   Pulse (!) 108   Temp (!) 97.5 F (36.4 C) (Temporal)   Resp 16   Ht 6\' 4"  (1.93 m)   Wt 263 lb 9.6 oz (119.6 kg)   SpO2 96%   BMI 32.09 kg/m   Wt Readings  from Last 3 Encounters:  10/11/20 263 lb 9.6 oz (119.6 kg)  09/27/20 259 lb (117.5 kg)  09/13/20 259 lb 6.4 oz (117.7 kg)    Physical Exam Vitals and nursing note reviewed.  Constitutional:      General: He is not in acute distress.    Appearance: He is well-developed and well-nourished. He is not diaphoretic.     Comments: Well-appearing, comfortable, cooperative  HENT:     Head: Normocephalic and atraumatic.     Mouth/Throat:     Mouth: Oropharynx is clear and moist.  Eyes:     General:        Right eye: No discharge.        Left eye: No discharge.     Conjunctiva/sclera: Conjunctivae normal.  Cardiovascular:     Rate and Rhythm: Normal rate.  Pulmonary:     Effort: Pulmonary effort is normal.  Musculoskeletal:        General: No edema.  Skin:    General: Skin is warm and dry.     Findings: No erythema or rash.  Neurological:     Mental Status: He is alert and oriented to person, place, and time.  Psychiatric:        Mood and Affect: Mood and affect normal.        Behavior: Behavior  normal.     Comments: Well groomed, good eye contact, normal speech and thoughts       Results for orders placed or performed in visit on 10/04/20  Lipid panel  Result Value Ref Range   Cholesterol 164 <200 mg/dL   HDL 42 > OR = 40 mg/dL   Triglycerides 891 <694 mg/dL   LDL Cholesterol (Calc) 102 (H) mg/dL (calc)   Total CHOL/HDL Ratio 3.9 <5.0 (calc)   Non-HDL Cholesterol (Calc) 122 <130 mg/dL (calc)  COMPLETE METABOLIC PANEL WITH GFR  Result Value Ref Range   Glucose, Bld 98 65 - 99 mg/dL   BUN 15 7 - 25 mg/dL   Creat 5.03 8.88 - 2.80 mg/dL   GFR, Est Non African American 90 > OR = 60 mL/min/1.51m2   GFR, Est African American 105 > OR = 60 mL/min/1.16m2   BUN/Creatinine Ratio NOT APPLICABLE 6 - 22 (calc)   Sodium 140 135 - 146 mmol/L   Potassium 4.1 3.5 - 5.3 mmol/L   Chloride 104 98 - 110 mmol/L   CO2 27 20 - 32 mmol/L   Calcium 9.1 8.6 - 10.3 mg/dL   Total Protein 7.3 6.1  - 8.1 g/dL   Albumin 4.5 3.6 - 5.1 g/dL   Globulin 2.8 1.9 - 3.7 g/dL (calc)   AG Ratio 1.6 1.0 - 2.5 (calc)   Total Bilirubin 0.5 0.2 - 1.2 mg/dL   Alkaline phosphatase (APISO) 54 35 - 144 U/L   AST 22 10 - 35 U/L   ALT 21 9 - 46 U/L      Assessment & Plan:   Problem List Items Addressed This Visit    Pure hypercholesterolemia - Primary    Controlled cholesterol on statin and lifestyle Last lipid panel 09/2020 new panel after statin The 10-year ASCVD risk score Denman George DC Jr., et al., 2013) is: 30.2%  Plan: 1. Continue current meds - Rosuvastatin 10mg  nightly tolerating well, advised improved adherence for better benefit 2. Encourage improved lifestyle - low carb/cholesterol, reduce portion size, continue improving regular exercise      Essential hypertension    Controlled HTN. - Home BP readings improved avg  No known complications     Plan:  1. Continue current med - Losartan 50mg  daily 2. Encourage improved lifestyle - low sodium diet, regular exercise 3. Continue monitor BP outside office, bring readings to next visit, if persistently >140/90 or new symptoms notify office sooner           No orders of the defined types were placed in this encounter.     Follow up plan: Return in about 3 months (around 01/09/2021) for 3 month PreDM A1c, HTN, HLD.   , DO [facility] 10/11/2020, 9:31 AM

## 2020-10-11 NOTE — Progress Notes (Signed)
10/11/2020 7:52 PM   Jesse Gardner 06/05/1946 [id number]  Referring provider: Smitty Cords, DO [address] [place],  [place] [address]  Chief Complaint  Patient presents with  . Follow-up    HPI: 13-year-year-old male with a left renal lesion he returns today with follow-up imaging.  Renal abscess was incidentally discovered during a hematuria work-up on CT urogram.  He states his only symptom was gross hematuria.  He underwent percutaneous drainage of the abscess on June 02, 2020.  Cytology from the abscess was negative .  He follows up today with repeat MRI performed on 10/07/2020.  This shows a persistent complex cystic mass favoring renal abscess although differential continues to include complex cyst versus malignancy.  The lesion now measures 4.9 x 4.1 x 5.1) drainage, measured up to 9.7 cm in largest diameter.  He continues to drain completely asymptomatic.  He denies any flank pain, fevers chills weight loss or any other symptoms.  He did have completion of his hematuria evaluation including cystoscopy in the office which was negative.  He said no further gross hematuria.    PMH: Past Medical History:  Diagnosis Date  . Hypertension     Surgical History: Past Surgical History:  Procedure Laterality Date  . APPENDECTOMY    . HERNIA REPAIR      Home Medications:  Allergies as of 10/11/2020   No Known Allergies     Medication List       Accurate as of October 11, 2020  7:52 PM. If you have any questions, ask your nurse or doctor.        STOP taking these medications   amoxicillin-clavulanate 875-125 MG tablet Commonly known as: AUGMENTIN Stopped by: Saralyn Pilar, DO     TAKE these medications   fluticasone 50 MCG/ACT nasal spray Commonly known as: FLONASE Place 2 sprays into both nostrils daily. Use for 4-6 weeks then stop and use seasonally or as needed.   losartan 50 MG tablet Commonly known as: COZAAR Take 1 tablet (50 mg  total) by mouth daily.   Melatonin 5 MG Caps Take by mouth.   naproxen sodium 220 MG tablet Commonly known as: ALEVE Take 440 mg by mouth daily as needed.   OSTEO BI-FLEX TRIPLE STRENGTH PO Take by mouth.   rosuvastatin 10 MG tablet Commonly known as: CRESTOR Take 1 tablet (10 mg total) by mouth at bedtime.       Allergies: No Known Allergies  Family History: Family History  Problem Relation Age of Onset  . Alzheimer's disease Mother   . Diabetes Father   . Prostate cancer Brother     Social History:  reports that he has been smoking cigarettes. He has a 14.25 pack-year smoking history. He has never used smokeless tobacco. He reports current alcohol use of about 2.0 standard drinks of alcohol per week. He reports that he does not use drugs.   Physical Exam: BP (!) 158/94   Pulse (!) 114   Constitutional:  Alert and oriented, No acute distress. HEENT: [place] AT, moist mucus membranes.  Trachea midline, no masses. Cardiovascular: No clubbing, cyanosis, or edema. Respiratory: Normal respiratory effort, no increased work of breathing. Skin: No rashes, bruises or suspicious lesions. Neurologic: Grossly intact, no focal deficits, moving all 4 extremities. Psychiatric: Normal mood and affect.  Pertinent Imaging: Narrative & Impression  CLINICAL DATA:  Follow-up left renal abscess status post percutaneous drainage in August.  EXAM: MRI ABDOMEN WITHOUT AND WITH CONTRAST  TECHNIQUE: Multiplanar  multisequence MR imaging of the abdomen was performed both before and after the administration of intravenous contrast.  CONTRAST:  75mL GADAVIST GADOBUTROL 1 MMOL/ML IV SOLN  COMPARISON:  05/30/2020 CT abdomen/pelvis.  FINDINGS: Lower chest: No acute abnormality at the lung bases.  Hepatobiliary: Normal liver size and configuration. Mild diffuse hepatic steatosis. No liver mass. Enhancing 5 mm polyp in posterior gallbladder wall (series 16/image 39). No gallstones. No  gallbladder wall thickening. No pericholecystic fluid. No biliary ductal dilatation. Common bile duct diameter 4 mm. No choledocholithiasis. No biliary masses, strictures or beading.  Pancreas: No pancreatic mass or duct dilation.  No pancreas divisum.  Spleen: Normal size. No mass.  Adrenals/Urinary Tract: Normal adrenals. No hydronephrosis. There is a circumscribed 4.9 x 4.1 x 5.1 cm renal cortical mass in the lower left kidney (series 4/image 29) demonstrating a thick enhancing wall and restricted diffusion centrally, decreased from 8.8 x 8.4 x 9.7 cm on 05/30/2020 CT. Simple 2.2 cm renal cortical cyst in the posterior upper left kidney. Additional scattered subcentimeter simple renal cysts in both kidneys.  Stomach/Bowel: Normal non-distended stomach. Visualized small and large bowel is normal caliber, with no bowel wall thickening. Scattered colonic diverticulosis.  Vascular/Lymphatic: Atherosclerotic nonaneurysmal abdominal aorta. Patent portal, splenic, hepatic and renal veins. No pathologically enlarged lymph nodes in the abdomen.  Other: No abdominal ascites or focal fluid collection.  Musculoskeletal: No aggressive appearing focal osseous lesions. Right L1 vertebral hemangioma again noted.  IMPRESSION: 1. 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 2. No abdominal lymphadenopathy.  Patent renal veins. 3. Gallbladder 5 mm polyp, requiring no follow-up. This recommendation follows ACR consensus guidelines: White Paper of the ACR Incidental Findings Committee II on Gallbladder and Biliary Findings. J Am Coll Radiol 2013:;10:953-956. 4. Mild diffuse hepatic steatosis. 5. Colonic diverticulosis.  Electronically Signed: By: Delbert Phenix M.D. On: 10/07/2020 17:03    MRI was personally reviewed today.  Agree with radiologic interpretation.  Is also directly compared to CT hematuria evaluation from previous for comparison.  Agree with radiologic interpretation.  Assessment & Plan:    1. Renal cyst Complex left renal cyst previously drained felt to possibly represent an abscess but clinically did not present in this manner  He states some interval decrease in size of the lesion which is reassuring.  Cytology was also negative.  Differential continues to include malignancy although this is not favored.  Suspect this is complex renal cyst rather than abscess.  We will continue to manage conservatively, repeat MRI in 6 months.  He is agreeable this plan. - MR Abdomen W Wo Contrast; Future   Vanna Scotland, MD  [facility] [address] [place], [place] [address] [phone]

## 2020-10-11 NOTE — Assessment & Plan Note (Signed)
Controlled HTN. - Home BP readings improved avg  No known complications     Plan:  1. Continue current med - Losartan 50mg daily 2. Encourage improved lifestyle - low sodium diet, regular exercise 3. Continue monitor BP outside office, bring readings to next visit, if persistently >140/90 or new symptoms notify office sooner 

## 2020-10-11 NOTE — Assessment & Plan Note (Signed)
Controlled cholesterol on statin and lifestyle Last lipid panel 09/2020 new panel after statin The 10-year ASCVD risk score Denman George DC Jr., et al., 2013) is: 30.2%  Plan: 1. Continue current meds - Rosuvastatin 10mg  nightly tolerating well, advised improved adherence for better benefit 2. Encourage improved lifestyle - low carb/cholesterol, reduce portion size, continue improving regular exercise

## 2020-10-11 NOTE — Patient Instructions (Addendum)
Thank you for coming to the office today.  Cholesterol medicine is working well. Keep on this medicine, try to take it every night.  We will check sugar fingerstick in 3 months.  Caution with elevated BP on sudafed. May try Coricidin HBP to reduce cold symptoms if you need  Please schedule a Follow-up Appointment to: Return in about 3 months (around 01/09/2021) for 3 month PreDM A1c, HTN, HLD.  If you have any other questions or concerns, please feel free to call the office or send a message through MyChart. You may also schedule an earlier appointment if necessary.  Additionally, you may be receiving a survey about your experience at our office within a few days to 1 week by e-mail or mail. We value your feedback.  Saralyn Pilar, DO [facility], [place]

## 2020-11-17 ENCOUNTER — Encounter: Payer: Self-pay | Admitting: *Deleted

## 2020-11-17 ENCOUNTER — Other Ambulatory Visit: Payer: Self-pay | Admitting: *Deleted

## 2020-11-17 DIAGNOSIS — Z87891 Personal history of nicotine dependence: Secondary | ICD-10-CM

## 2020-11-17 DIAGNOSIS — Z122 Encounter for screening for malignant neoplasm of respiratory organs: Secondary | ICD-10-CM

## 2020-11-17 NOTE — Progress Notes (Signed)
Contacted and scheduled for annual lung screening scan. Patient is a former smoker with quit date of 10/29/20. 103.5 pack year history.

## 2020-11-28 ENCOUNTER — Ambulatory Visit: Admission: RE | Admit: 2020-11-28 | Payer: Medicare Other | Source: Ambulatory Visit

## 2020-11-28 DIAGNOSIS — Z20822 Contact with and (suspected) exposure to covid-19: Secondary | ICD-10-CM | POA: Diagnosis not present

## 2020-11-30 ENCOUNTER — Ambulatory Visit: Payer: Self-pay | Admitting: *Deleted

## 2020-11-30 NOTE — Telephone Encounter (Signed)
Pt called in after just receiving a call  from the health dept that he is positive for covid.   He was tested at [facility].  He started having symptoms Friday night.   He is c/o a bad sore throat, sinus drainage down the back of his throat, runny nose, headache a little dizziness.   "I mostly just feel bad".  He has been around his grandchildren and daughter over the weekend and saw them after school Mon and Tues this week.   I instructed him to let them know he is positive for covid.   "My daughter is a Emergency planning/management officer I hope she doesn't get it".  I went over the care advice regarding OTC medications he could use for his symptoms.  The health dept went over the quarantine protocol with him.   He denies any underlying risks except hypertension.  He wanted to let Dr. Althea Charon know about the positive test.  He didn't know if Dr. Blythe Stanford would prescribe anything or not since "I feel so bad with this stuff".  I sent my notes to [facility] for Dr. Althea Charon.      Reason for Disposition . HIGH RISK for severe COVID complications (e.g., age > 64 years, obesity with BMI > 25, pregnant, chronic lung disease or other chronic medical condition)  (Exception: Already seen by PCP and no new or worsening symptoms.)    75 yrs old   Denies underlying health problems except hypertension  Answer Assessment - Initial Assessment Questions 1. COVID-19 DIAGNOSIS: "Who made your COVID-19 diagnosis?" "Was it confirmed by a positive lab test?" If not diagnosed by a HCP, ask "Are there lots of cases (community spread) where you live?" Note: See public health department website, if unsure.     Cov[facility] test done at [facility].   The health dept called me and let me know I was positive. 2. COVID-19 EXPOSURE: "Was there any known exposure to COVID before the symptoms began?" CDC Definition of close contact: within 6 feet (2 meters) for a total of 15 minutes or more over a 24-hour period.      positive 3. ONSET:  "When did the COVID-19 symptoms start?"      Sore throat, not sleeping,sinus drainage, voice is raspy, chills, fever but I don't have a thermometer.   Symptoms started last Friday night. 4. WORST SYMPTOM: "What is your worst symptom?" (e.g., cough, fever, shortness of breath, muscle aches)     Sore throat 5. COUGH: "Do you have a cough?" If Yes, ask: "How bad is the cough?"       Yes dry cough 6. FEVER: "Do you have a fever?" If Yes, ask: "What is your temperature, how was it measured, and when did it start?"     Yes 7. RESPIRATORY STATUS: "Describe your breathing?" (e.g., shortness of breath, wheezing, unable to speak)      No chest tightness or pain.    Nothing usual for me. 8. BETTER-SAME-WORSE: "Are you getting better, staying the same or getting worse compared to yesterday?"  If getting worse, ask, "In what way?"     I feel terrible 9. HIGH RISK DISEASE: "Do you have any chronic medical problems?" (e.g., asthma, heart or lung disease, weak immune system, obesity, etc.)     Hypertension 10. VACCINE: "Have you gotten the COVID-19 vaccine?" If Yes ask: "Which one, how many shots, when did you get it?"       I both vaccines but not the booster  for covid 11. PREGNANCY: "Is there any chance you are pregnant?" "When was your last menstrual period?"       N/A 12. OTHER SYMPTOMS: "Do you have any other symptoms?"  (e.g., chills, fatigue, headache, loss of smell or taste, muscle pain, sore throat; new loss of smell or taste especially support the diagnosis of COVID-19)       See above  Protocols used: CORONAVIRUS (COVID-19) DIAGNOSED OR SUSPECTED-A-AH

## 2020-12-23 ENCOUNTER — Telehealth: Payer: Self-pay | Admitting: *Deleted

## 2020-12-23 NOTE — Telephone Encounter (Signed)
Voicemail left on home number, no answer on mobile number.   Message left to return call to set up CT scan

## 2020-12-28 ENCOUNTER — Telehealth: Payer: Self-pay | Admitting: *Deleted

## 2020-12-28 DIAGNOSIS — Z122 Encounter for screening for malignant neoplasm of respiratory organs: Secondary | ICD-10-CM

## 2020-12-28 DIAGNOSIS — Z87891 Personal history of nicotine dependence: Secondary | ICD-10-CM

## 2020-12-28 NOTE — Telephone Encounter (Signed)
Contacted patient today , patient states he is still interested in getting his yearly CT lung screening.  He stopped smoking October 29, 2020.  He has smoked since he was 75 years old.  Gregary Signs already has the amount of packs of cigarettes that he has had over his lifetime and a previous note.  He has not changed insurance, he had asthma as a kid and never been diagnosed with COPD to his knowledge.  He states he has no chronic issues that he knows of. I have set the patient up for his lung screening on March 16 at 9 AM. Patient says that he knows that the scans at at [facility]

## 2020-12-29 NOTE — Telephone Encounter (Signed)
Patient is a former smoker, quit 10/29/20, 103.5 pack year history.

## 2021-01-11 ENCOUNTER — Other Ambulatory Visit: Payer: Self-pay

## 2021-01-11 ENCOUNTER — Ambulatory Visit
Admission: RE | Admit: 2021-01-11 | Discharge: 2021-01-11 | Disposition: A | Discharge status: Home / Self Care | Payer: Medicare Other | Source: Ambulatory Visit | Attending: Oncology | Admitting: Oncology

## 2021-01-11 DIAGNOSIS — Z122 Encounter for screening for malignant neoplasm of respiratory organs: Secondary | ICD-10-CM | POA: Insufficient documentation

## 2021-01-11 DIAGNOSIS — Z87891 Personal history of nicotine dependence: Secondary | ICD-10-CM | POA: Insufficient documentation

## 2021-01-11 IMAGING — CT CT CHEST LUNG CANCER SCREENING LOW DOSE W/O CM
2 of 5 series · 15 of 40 positions shown, 18 images · non-contrast
Comparison: [DATE]

CLINICAL DATA: One hundred four pack-year smoking history, quitting
6 months ago.

EXAM:
CT CHEST WITHOUT CONTRAST LOW-DOSE FOR LUNG CANCER SCREENING
TECHNIQUE: Multidetector CT imaging of the chest was performed following the
standard protocol without IV contrast.

[Series 3: lung 1.00 · axial · 0.78mm/px · z∈[-1225,-916]mm · 12 of 343 slices shown, 15 images]
[im 17/343  mediastinal]
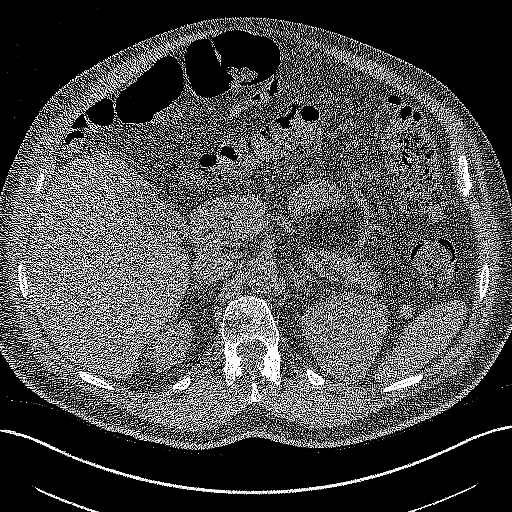
[im 17/343  lung]
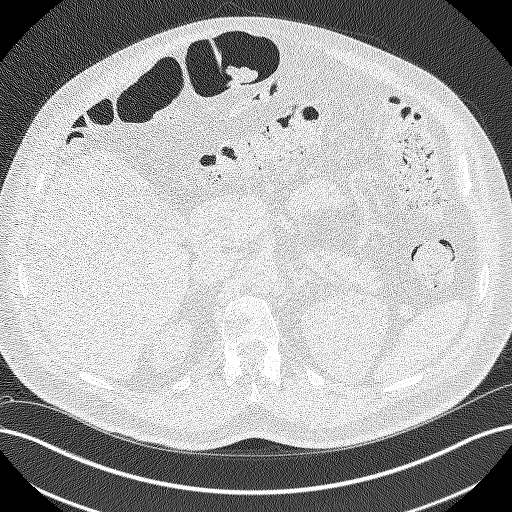
[im 49/343  lung]
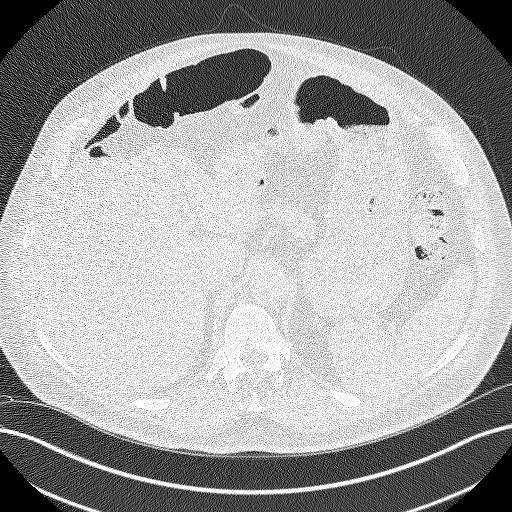
[im 82/343  lung]
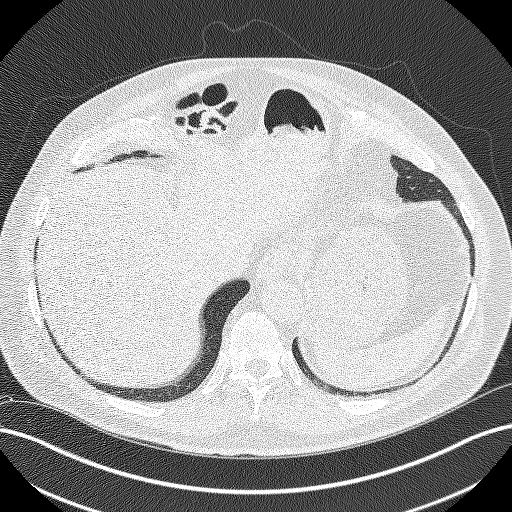
[im 98/343  lung]
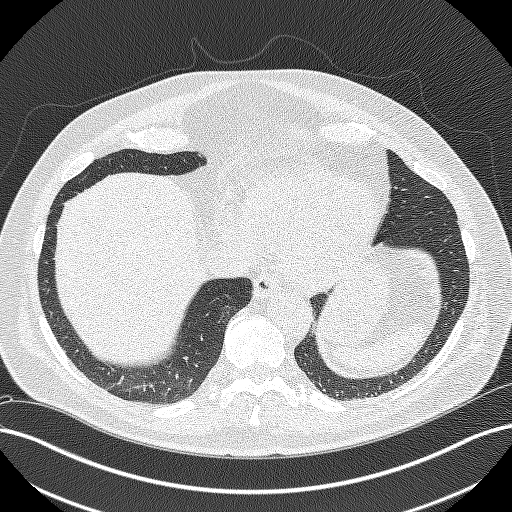
[im 131/343  mediastinal]
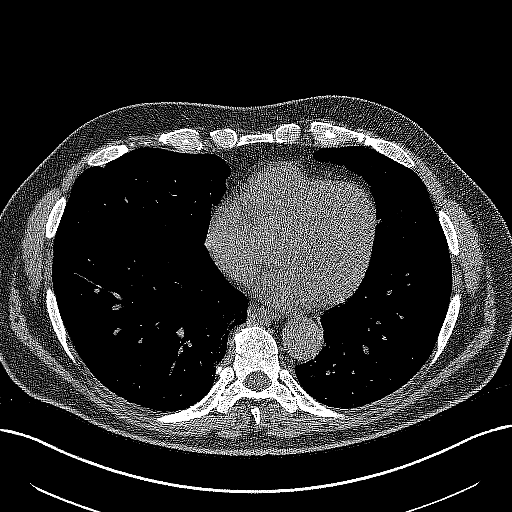
[im 131/343  lung]
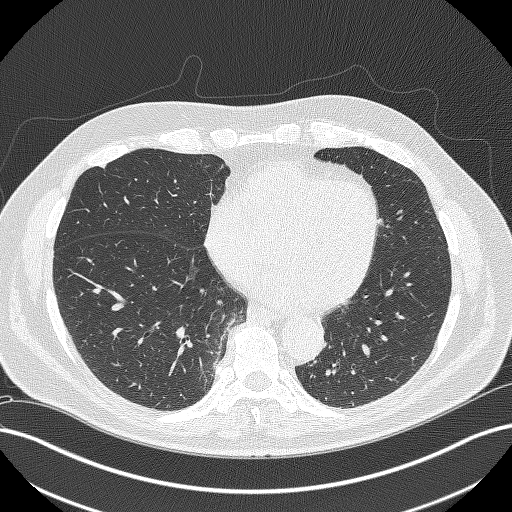
[im 163/343  lung]
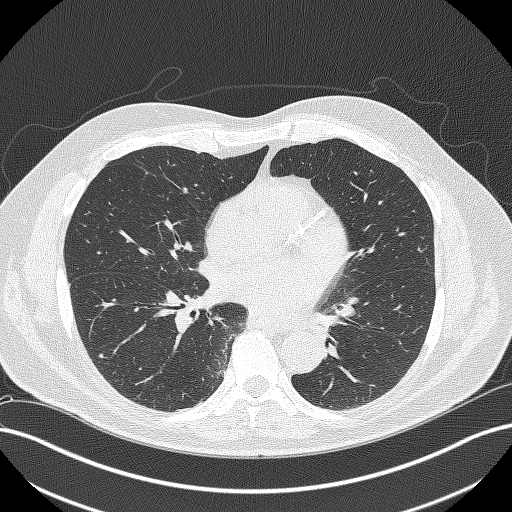
[im 180/343  lung]
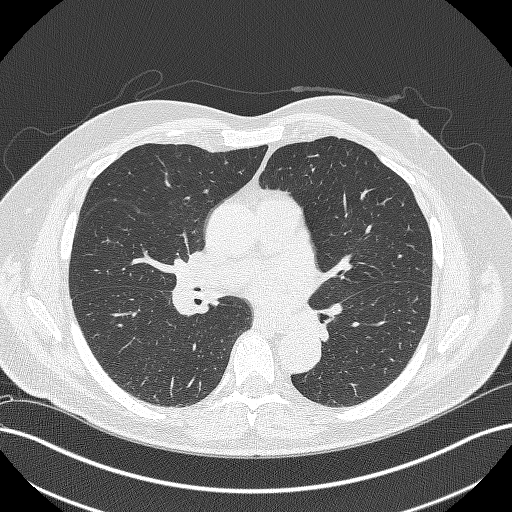
[im 212/343  lung]
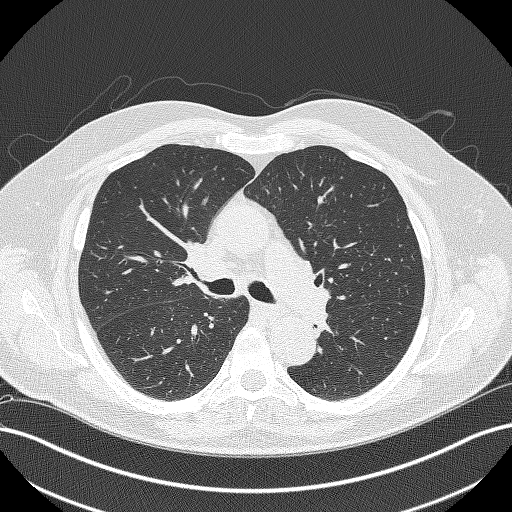
[im 245/343  mediastinal]
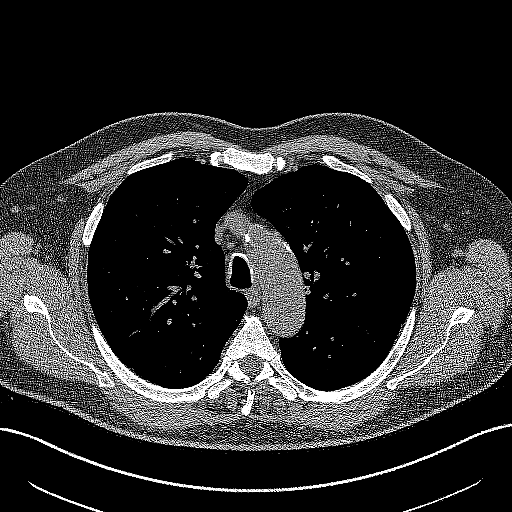
[im 245/343  lung]
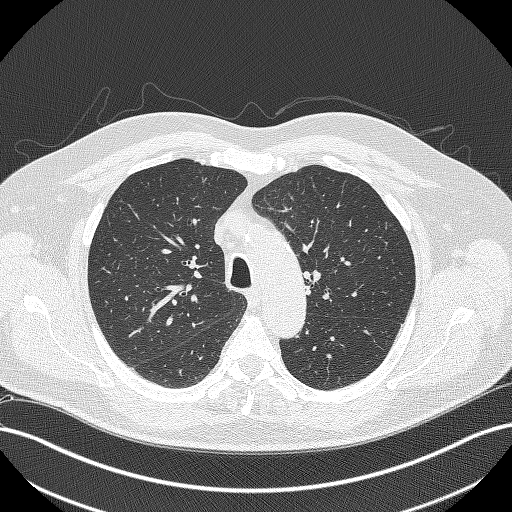
[im 261/343  lung]
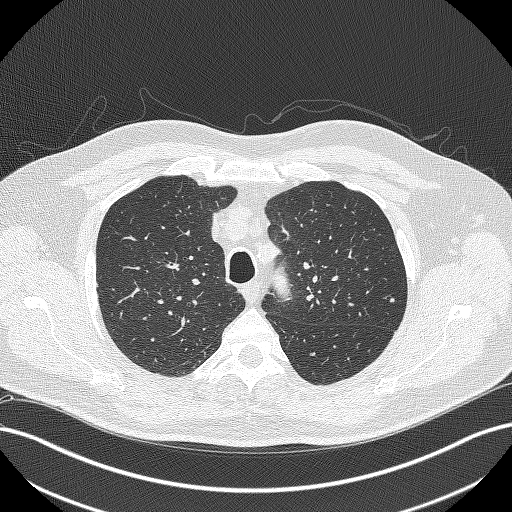
[im 294/343  lung]
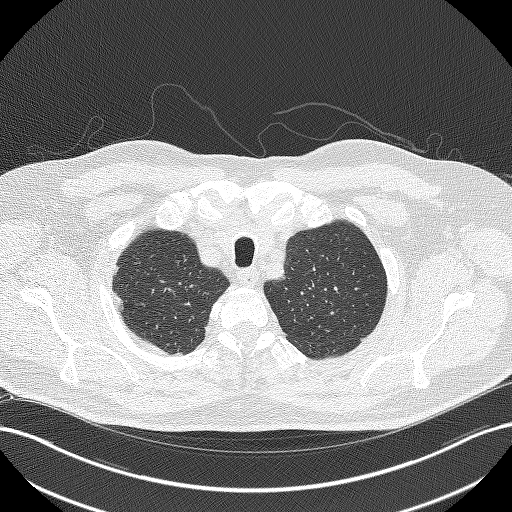
[im 326/343  lung]
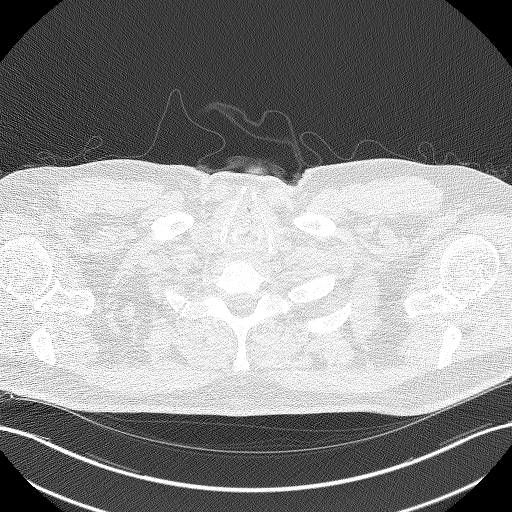

[Series 5: coronals lung 1.00 cor · coronal · 0.67mm/px · 3 of 288 slices shown]
[im 58/288  lung]
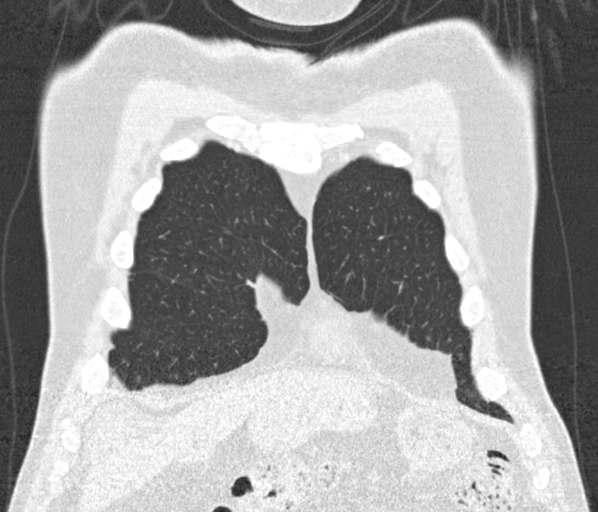
[im 115/288  lung]
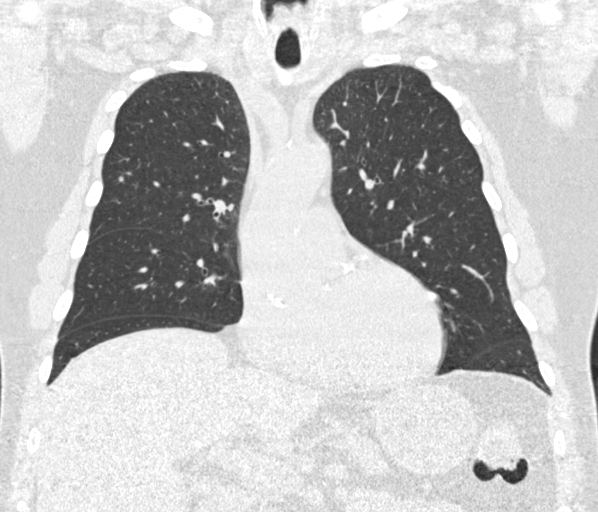
[im 173/288  lung]
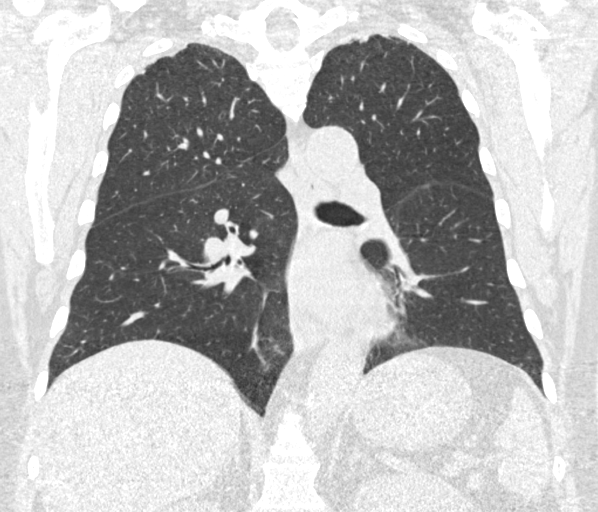

[15 of 40 positions shown; findings below may reference images not displayed]

FINDINGS: Cardiovascular: Aortic atherosclerosis. Tortuous thoracic aorta.
Normal heart size, without pericardial effusion. Multivessel
coronary artery atherosclerosis. Aortic valve calcification.

Mediastinum/Nodes: No mediastinal or definite hilar adenopathy,
given limitations of unenhanced CT.

Lungs/Pleura: No pleural fluid. Mild centrilobular emphysema. Mild
biapical pleuroparenchymal scarring.

Pulmonary nodules of maximally volume derived equivalent diameter
5.7 mm are not significantly changed.

Upper Abdomen: Normal imaged portions of the liver, spleen, stomach,
gallbladder, pancreas, adrenal glands, kidneys.

Musculoskeletal: No acute osseous abnormality.
IMPRESSION: 1. Lung-RADS 2, benign appearance or behavior. Continue annual
screening with low-dose chest CT without contrast in 12 months.
2. Aortic Atherosclerosis ([02]-[02]) and Emphysema ([02]-[02]).
Coronary artery atherosclerosis.
3. Aortic valvular calcifications. Consider echocardiography to
evaluate for valvular dysfunction.

## 2021-01-12 ENCOUNTER — Encounter: Payer: Self-pay | Admitting: Family Medicine

## 2021-01-12 ENCOUNTER — Other Ambulatory Visit: Payer: Self-pay | Admitting: Family Medicine

## 2021-01-12 ENCOUNTER — Ambulatory Visit (INDEPENDENT_AMBULATORY_CARE_PROVIDER_SITE_OTHER): Payer: Medicare Other | Admitting: Family Medicine

## 2021-01-12 VITALS — BP 135/69 | HR 109 | Ht 76.0 in | Wt 259.4 lb

## 2021-01-12 DIAGNOSIS — E669 Obesity, unspecified: Secondary | ICD-10-CM

## 2021-01-12 DIAGNOSIS — J310 Chronic rhinitis: Secondary | ICD-10-CM | POA: Diagnosis not present

## 2021-01-12 DIAGNOSIS — R7303 Prediabetes: Secondary | ICD-10-CM

## 2021-01-12 DIAGNOSIS — E7800 Pure hypercholesterolemia, unspecified: Secondary | ICD-10-CM

## 2021-01-12 DIAGNOSIS — I10 Essential (primary) hypertension: Secondary | ICD-10-CM | POA: Diagnosis not present

## 2021-01-12 DIAGNOSIS — Z0000 Encounter for general adult medical examination without abnormal findings: Secondary | ICD-10-CM

## 2021-01-12 DIAGNOSIS — J329 Chronic sinusitis, unspecified: Secondary | ICD-10-CM

## 2021-01-12 DIAGNOSIS — N138 Other obstructive and reflux uropathy: Secondary | ICD-10-CM

## 2021-01-12 LAB — POCT GLYCOSYLATED HEMOGLOBIN (HGB A1C): Hemoglobin A1C: 5.7 % — AB (ref 4.0–5.6)

## 2021-01-12 MED ORDER — IPRATROPIUM BROMIDE 0.06 % NA SOLN: 2.0000 | Freq: Four times a day (QID) | NASAL | 2 refills | Status: DC | PRN

## 2021-01-12 NOTE — Assessment & Plan Note (Signed)
Previously controlled cholesterol on statin and lifestyle Last lipid panel 09/2020 new panel after statin The 10-year ASCVD risk score Denman George DC Jr., et al., 2013) is: 31.5%  Plan: 1. Remain off statin due to side effect 2. Consider lower dose Rosuvastatin 5mg  intermittent dosing - he declines today

## 2021-01-12 NOTE — Assessment & Plan Note (Signed)
Improved A1c 5.7 Concern with obesity, HTN, HLD  Plan:  1. Not on any therapy currently  2. Encourage improved lifestyle - low carb, low sugar diet, reduce portion size, continue improving regular exercise

## 2021-01-12 NOTE — Assessment & Plan Note (Signed)
Controlled HTN. - Home BP readings improved avg  No known complications     Plan:  1. Continue current med - Losartan 50mg  daily 2. Encourage improved lifestyle - low sodium diet, regular exercise 3. Continue monitor BP outside office, bring readings to next visit, if persistently >140/90 or new symptoms notify office sooner

## 2021-01-12 NOTE — Progress Notes (Signed)
Subjective:    Patient ID: Jesse Gardner, male    DOB: [birth year], 75 y.o.   MRN: [id number]  Emauri Krygier is a 75 y.o. male presenting on 01/12/2021 for Prediabetes, Hypertension, and Hyperlipidemia   HPI   Pre-Diabetes Improved A1c trend over past 1-2 years, A1c 6.0 down to 5.8 Due today for A1c Meds:None He is on ARB Lifestyle: - Diet (Reduced sugar added to tea and coffee, he has reduced dessertseven more now, less cobbler) - Exercise (limited due to knee arthritis) Denies hypoglycemia, polyuria, visual changes, numbness or tingling.  Essential Hypertension/ Obesity BMI >31 History of irregular heart rate, identified for procedure - had episodic tachycardia without arrhythmia, recently reported Home BP has been in normal range mostly He does take Sudafed for sinuses often. Sometimes raised BP Current Meds -Losartan 50mg  daily Lifestyle: - Diet:Drinks up topot of coffee daily, drinks tea. Not drinking soft drinks. - Exercise:No longer going to the gym.Limited exercise Denies CP, dyspnea, HA, edema, dizziness / lightheadedness  HYPERLIPIDEMIA: Last lipid panel 09/2020, improved on statin LDL down to 102 On Rosuvastatin 10mg  - He had side effect with loose bowels after a few weeks or months on medicine. He had some near incontinence or leakage He has discontinued Rosuvastatin 10mg  He is doing better now off statin, no loose stool.  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 He uses Flonase regularly, but says needs something else. He takes Claritin D occasionally or Sudafed PRN.    Depression screen [facility] 2/9 09/27/2020 07/12/2020 01/01/2020  Decreased Interest 0 0 0  Down, Depressed, Hopeless 1 0 0  PHQ - 2 Score 1 0 0    Social History   Tobacco Use  . Smoking status: Current Some Day Smoker    Packs/day: 0.25    Years: 57.00    Pack years: 14.25    Types: Cigarettes  . Smokeless tobacco: Never Used  . Tobacco comment: 1 cigarette in the  last month   Vaping Use  . Vaping Use: Never used  Substance Use Topics  . Alcohol use: Yes    Alcohol/week: 2.0 standard drinks    Types: 2 Glasses of wine per week  . Drug use: Never    Review of Systems Per HPI unless specifically indicated above     Objective:    BP 135/69   Pulse (!) 109   Ht 6\' 4"  (1.93 m)   Wt 259 lb 6.4 oz (117.7 kg)   SpO2 96%   BMI 31.58 kg/m   Wt Readings from Last 3 Encounters:  01/12/21 259 lb 6.4 oz (117.7 kg)  01/11/21 263 lb (119.3 kg)  10/11/20 263 lb 9.6 oz (119.6 kg)    Physical Exam Vitals and nursing note reviewed.  Constitutional:      General: He is not in acute distress.    Appearance: He is well-developed. He is not diaphoretic.     Comments: Well-appearing, comfortable, cooperative  HENT:     Head: Normocephalic and atraumatic.  Eyes:     General:        Right eye: No discharge.        Left eye: No discharge.     Conjunctiva/sclera: Conjunctivae normal.  Cardiovascular:     Rate and Rhythm: Normal rate.  Pulmonary:     Effort: Pulmonary effort is normal.  Skin:    General: Skin is warm and dry.     Findings: No erythema or rash.  Neurological:     Mental Status: He is  alert and oriented to person, place, and time.  Psychiatric:        Behavior: Behavior normal.     Comments: Well groomed, good eye contact, normal speech and thoughts        Results for orders placed or performed in visit on 01/12/21  POCT glycosylated hemoglobin (Hb A1C)  Result Value Ref Range   Hemoglobin A1C 5.7 (A) 4.0 - 5.6 %      Assessment & Plan:   Problem List Items Addressed This Visit    Pure hypercholesterolemia    Previously controlled cholesterol on statin and lifestyle Last lipid panel 09/2020 new panel after statin The 10-year ASCVD risk score Denman George DC Jr., et al., 2013) is: 31.5%  Plan: 1. Remain off statin due to side effect 2. Consider lower dose Rosuvastatin 5mg  intermittent dosing - he declines today       Pre-diabetes    Improved A1c 5.7 Concern with obesity, HTN, HLD  Plan:  1. Not on any therapy currently  2. Encourage improved lifestyle - low carb, low sugar diet, reduce portion size, continue improving regular exercise      Relevant Orders   POCT glycosylated hemoglobin (Hb A1C) (Completed)   Essential hypertension - Primary    Controlled HTN. - Home BP readings improved avg  No known complications     Plan:  1. Continue current med - Losartan 50mg  daily 2. Encourage improved lifestyle - low sodium diet, regular exercise 3. Continue monitor BP outside office, bring readings to next visit, if persistently >140/90 or new symptoms notify office sooner       Other Visit Diagnoses    Chronic rhinosinusitis       Relevant Medications   ipratropium (ATROVENT) 0.06 % nasal spray      Start Atrovent nasal spray decongestant 2 sprays in each nostril up to 4 times daily PRN May consider Azelastine as well in future vs Montelukast   Meds ordered this encounter  Medications  . ipratropium (ATROVENT) 0.06 % nasal spray    Sig: Place 2 sprays into both nostrils 4 (four) times daily as needed for rhinitis.    Dispense:  15 mL    Refill:  2      Follow up plan: Return in about 6 months (around 07/15/2021) for 6 month fasting lab only then 1 week later Annual Physical.  Future labs ordered for 06/2021  07/17/2021, DO [facility] 01/12/2021, 9:09 AM

## 2021-01-12 NOTE — Patient Instructions (Addendum)
Thank you for coming to the office today.  Reconsider lower dose Statin Rosuvastatin 5mg  instead of 10mg , and can take it 1-2 times a week ONLY.  Start Atrovent nasal spray decongestant 2 sprays in each nostril up to 4 times daily as needed.  Can continue Claritin.  Recent Labs    07/06/20 0808 01/12/21 0921  HGBA1C 5.8* 5.7*   DUE for FASTING BLOOD WORK (no food or drink after midnight before the lab appointment, only water or coffee without cream/sugar on the morning of)  SCHEDULE "Lab Only" visit in the morning at the clinic for lab draw in 6 MONTHS   - Make sure Lab Only appointment is at about 1 week before your next appointment, so that results will be available  For Lab Results, once available within 2-3 days of blood draw, you can can log in to MyChart online to view your results and a brief explanation. Also, we can discuss results at next follow-up visit.   Please schedule a Follow-up Appointment to: Return in about 6 months (around 07/15/2021) for 6 month fasting lab only then 1 week later Annual Physical.  If you have any other questions or concerns, please feel free to call the office or send a message through MyChart. You may also schedule an earlier appointment if necessary.  Additionally, you may be receiving a survey about your experience at our office within a few days to 1 week by e-mail or mail. We value your feedback.  01/14/21, DO [facility], Saralyn Pilar

## 2021-01-13 ENCOUNTER — Other Ambulatory Visit: Payer: Self-pay | Admitting: Family Medicine

## 2021-01-13 DIAGNOSIS — E7800 Pure hypercholesterolemia, unspecified: Secondary | ICD-10-CM

## 2021-01-19 ENCOUNTER — Encounter: Payer: Self-pay | Admitting: *Deleted

## 2021-03-20 DIAGNOSIS — H2513 Age-related nuclear cataract, bilateral: Secondary | ICD-10-CM | POA: Diagnosis not present

## 2021-03-20 DIAGNOSIS — H04123 Dry eye syndrome of bilateral lacrimal glands: Secondary | ICD-10-CM | POA: Diagnosis not present

## 2021-03-20 DIAGNOSIS — H25013 Cortical age-related cataract, bilateral: Secondary | ICD-10-CM | POA: Diagnosis not present

## 2021-03-29 ENCOUNTER — Other Ambulatory Visit: Payer: Self-pay

## 2021-03-29 ENCOUNTER — Ambulatory Visit
Admission: RE | Admit: 2021-03-29 | Discharge: 2021-03-29 | Disposition: A | Discharge status: Home / Self Care | Payer: Medicare Other | Source: Ambulatory Visit | Attending: Urology | Admitting: Urology

## 2021-03-29 DIAGNOSIS — N281 Cyst of kidney, acquired: Secondary | ICD-10-CM

## 2021-03-29 DIAGNOSIS — K760 Fatty (change of) liver, not elsewhere classified: Secondary | ICD-10-CM | POA: Diagnosis not present

## 2021-03-29 DIAGNOSIS — Z872 Personal history of diseases of the skin and subcutaneous tissue: Secondary | ICD-10-CM | POA: Diagnosis not present

## 2021-03-29 DIAGNOSIS — N151 Renal and perinephric abscess: Secondary | ICD-10-CM | POA: Diagnosis not present

## 2021-03-29 IMAGING — MR MR ABDOMEN WO/W CM
16 of 17 series · 43 of 48 positions shown · IV contrast (7)
Comparison: MRI on [DATE]

CLINICAL DATA: Follow-up left renal abscess.

EXAM:
MRI ABDOMEN WITHOUT AND WITH CONTRAST
TECHNIQUE: Multiplanar multisequence MR imaging of the abdomen was performed
both before and after the administration of intravenous contrast.
CONTRAST:  10mL GADAVIST GADOBUTROL 1 MMOL/ML IV SOLN

[Series 3: cor ssfse / · coronal · 7.0mm · 1.48mm/px · 1 of 35 slices shown]
[im 1/35]
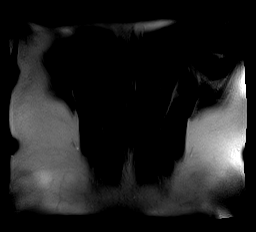

[Series 4: T2 fat-sat · axial · 6.0mm · 1.48mm/px · 1 of 35 slices shown]
[im 1/35]
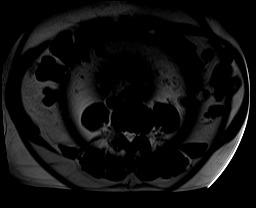

[Series 5: T1 · axial · 6.0mm · 0.78mm/px · z∈[-194,+51]mm · 3 of 70 slices shown]
[im 1/70]
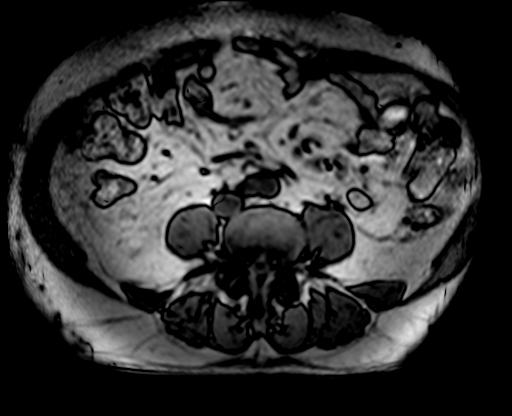
[im 35/70]
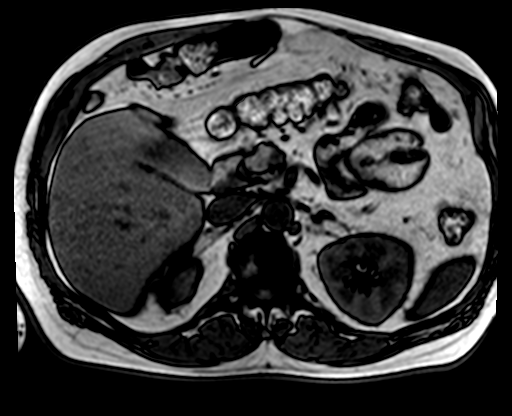
[im 70/70]
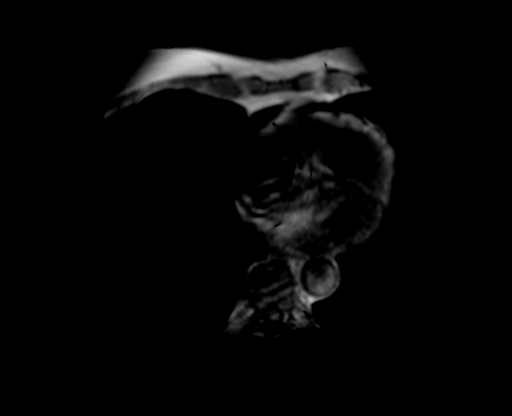

[Series 7: DWI · axial · 6.0mm · 2.08mm/px · z∈[-220,+61]mm · 5 of 120 slices shown (1 of 2)]
[im 1/120]
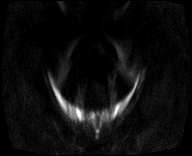
[im 30/120]
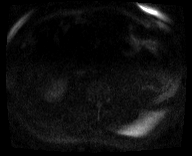
[im 60/120]
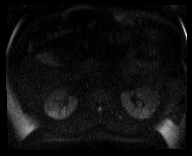
[im 90/120]
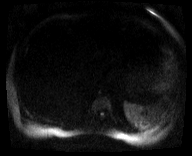
[im 120/120]
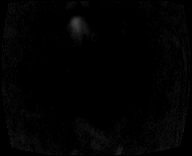

[Series 8: DWI · axial · 6.0mm · 2.08mm/px · z∈[-220,+61]mm · 2 of 40 slices shown (2 of 2)]
[im 1/40]
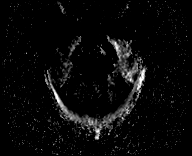
[im 40/40]
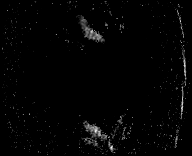

[Series 9: bSSFP · axial · 6.0mm · 0.78mm/px · z∈[-194,+51]mm · 2 of 35 slices shown]
[im 1/35]
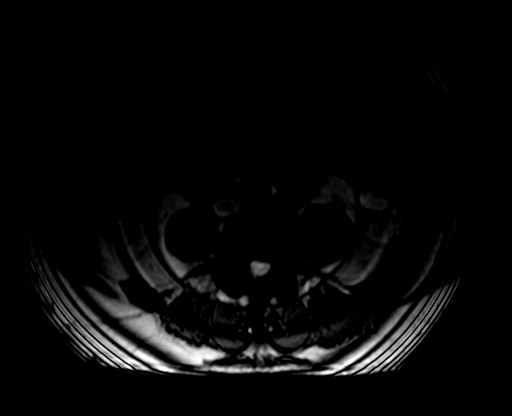
[im 35/35]
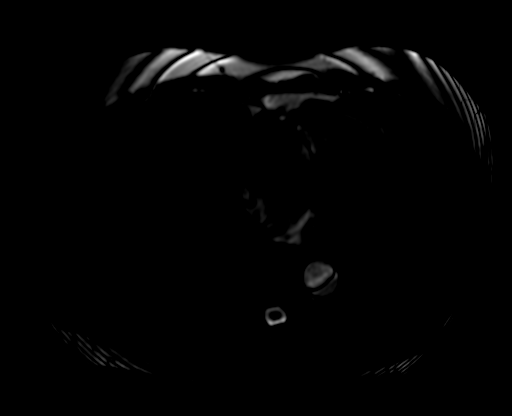

[Series 10: axial ssfse / · axial · 6.0mm · 1.25mm/px · z∈[-194,+51]mm · 2 of 35 slices shown]
[im 1/35]
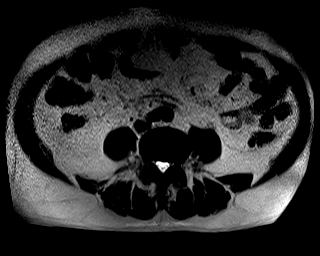
[im 35/35]
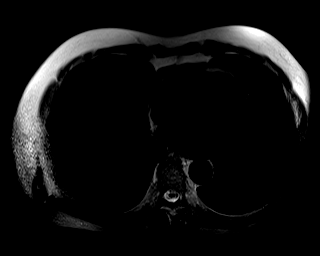

[Series 11: axial dynamic pre · axial · non-contrast · 4.0mm · 1.25mm/px · z∈[-198,+54]mm · 3 of 64 slices shown]
[im 1/64]
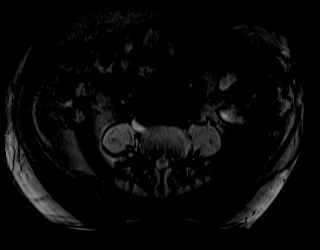
[im 32/64]
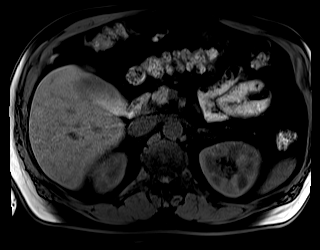
[im 64/64]
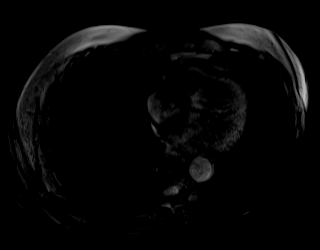

[Series 12: axial dynamic post · axial · 4.0mm · 1.25mm/px · z∈[-198,+54]mm · 3 of 64 slices shown (1 of 6)]
[im 1/64]
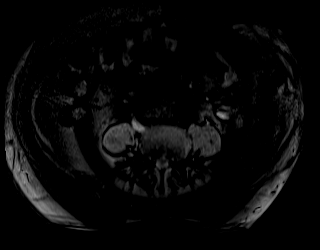
[im 32/64]
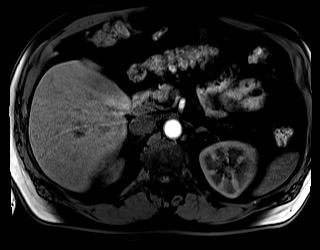
[im 64/64]
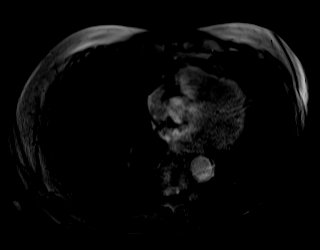

[Series 13: axial dynamic post · axial · 4.0mm · 1.25mm/px · z∈[-198,+54]mm · 3 of 64 slices shown (2 of 6)]
[im 1/64]
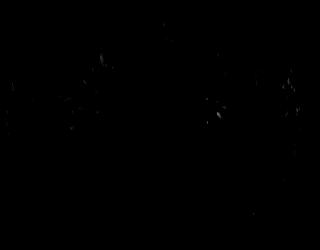
[im 32/64]
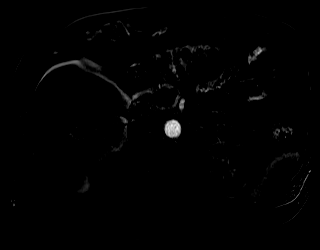
[im 64/64]
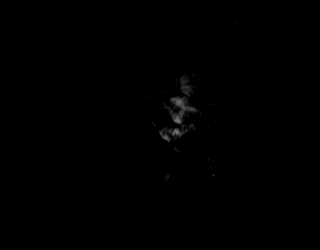

[Series 14: axial dynamic post · axial · 4.0mm · 1.25mm/px · z∈[-198,+54]mm · 3 of 64 slices shown (3 of 6)]
[im 1/64]
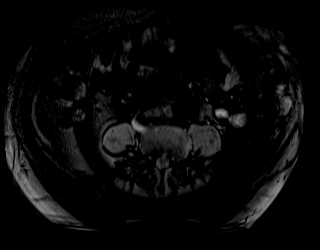
[im 32/64]
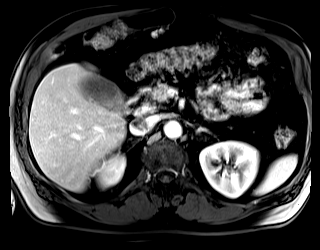
[im 64/64]
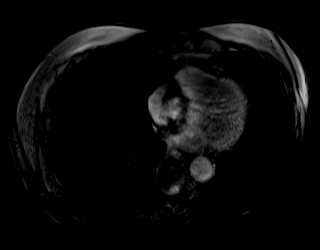

[Series 15: axial dynamic post · axial · 4.0mm · 1.25mm/px · z∈[-198,+54]mm · 3 of 64 slices shown (4 of 6)]
[im 1/64]
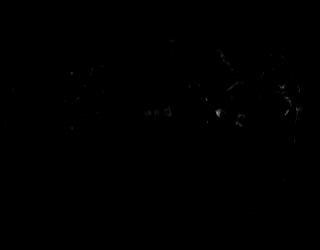
[im 32/64]
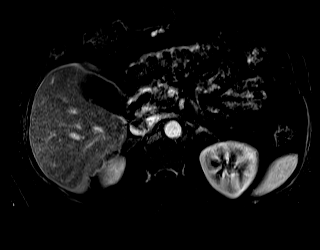
[im 64/64]
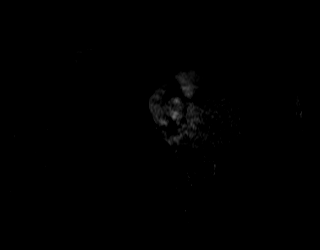

[Series 16: axial dynamic post · axial · 4.0mm · 1.25mm/px · z∈[-198,+54]mm · 3 of 64 slices shown (5 of 6)]
[im 1/64]
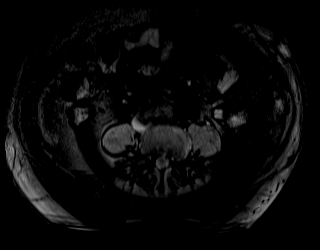
[im 32/64]
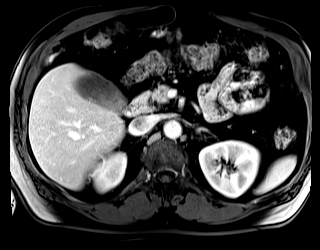
[im 64/64]
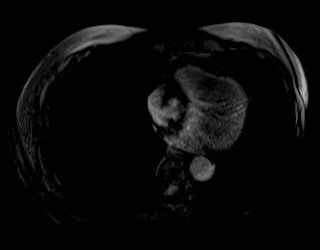

[Series 17: axial dynamic post · axial · 4.0mm · 1.25mm/px · z∈[-198,+54]mm · 3 of 64 slices shown (6 of 6)]
[im 1/64]
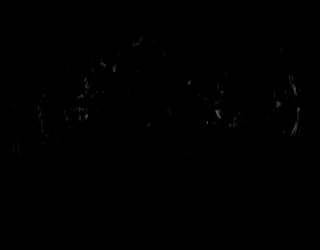
[im 32/64]
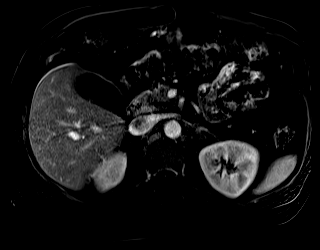
[im 64/64]
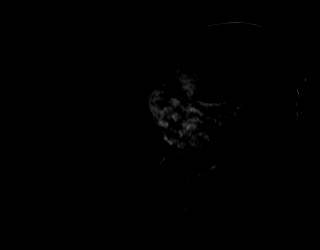

[Series 19: axial dynamic 3 · axial · 4.0mm · 1.25mm/px · z∈[-198,+54]mm · 3 of 64 slices shown (1 of 2)]
[im 1/64]
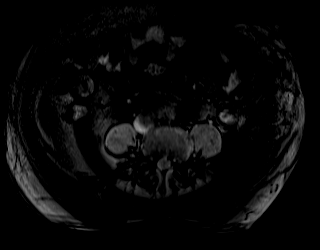
[im 32/64]
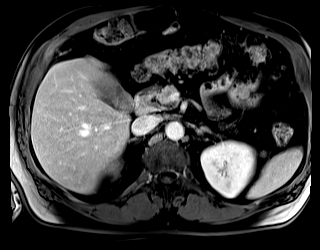
[im 64/64]
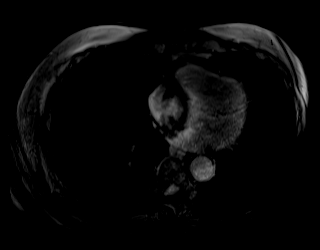

[Series 20: axial dynamic 3 · axial · 4.0mm · 1.25mm/px · z∈[-198,+54]mm · 3 of 64 slices shown (2 of 2)]
[im 1/64]
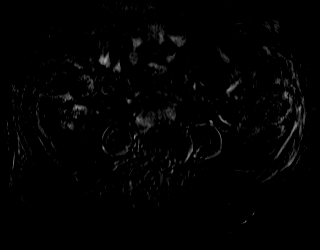
[im 32/64]
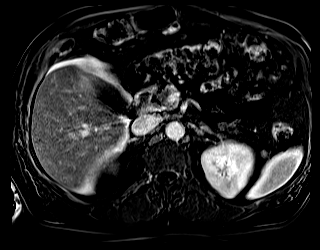
[im 64/64]
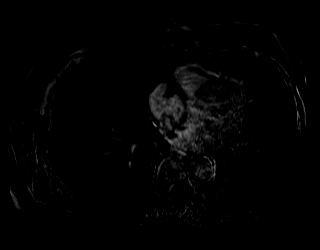

[43 of 48 positions shown; findings below may reference images not displayed]

FINDINGS: Lower chest: No acute findings.

Hepatobiliary: No hepatic masses identified. Mild diffuse hepatic
steatosis is again demonstrated. Insert gall

Pancreas:  No mass or inflammatory changes.

Spleen:  Within normal limits in size and appearance.

Adrenals/Urinary Tract: Normal appearance of adrenal glands. Cystic
lesion with thick peripheral enhancing rim is again seen in the
lower pole of the left kidney, but is significantly decreased in
size since prior exam. This currently measures 3.2 x 2.6 cm on image
[DATE], compared to 4.9 x 4.1 cm previously. This is consistent with
resolving abscess. Small simple cyst in midpole of left kidney is
also stable. No evidence of hydronephrosis.

Stomach/Bowel: Visualized portion unremarkable.

Vascular/Lymphatic: No pathologically enlarged lymph nodes
identified. No acute vascular findings.

Other:  None.

Musculoskeletal:  No suspicious bone lesions identified.
IMPRESSION: Significant decrease in size of thick-walled cystic lesion in lower
pole of the left kidney, consistent with resolving renal abscess.

No evidence of hydronephrosis or other acute findings.

Mild hepatic steatosis.

## 2021-03-29 MED ORDER — GADOBUTROL 1 MMOL/ML IV SOLN
10.0000 mL | Freq: Once | INTRAVENOUS | Status: AC | PRN
  Administered 2021-03-29: 10 mL via INTRAVENOUS

## 2021-04-11 ENCOUNTER — Other Ambulatory Visit: Payer: Self-pay

## 2021-04-11 ENCOUNTER — Ambulatory Visit (INDEPENDENT_AMBULATORY_CARE_PROVIDER_SITE_OTHER): Payer: Medicare Other | Admitting: Urology

## 2021-04-11 ENCOUNTER — Encounter: Payer: Self-pay | Admitting: Urology

## 2021-04-11 VITALS — BP 152/89 | HR 119 | Ht 76.0 in | Wt 254.0 lb

## 2021-04-11 DIAGNOSIS — N281 Cyst of kidney, acquired: Secondary | ICD-10-CM | POA: Diagnosis not present

## 2021-04-11 DIAGNOSIS — N151 Renal and perinephric abscess: Secondary | ICD-10-CM | POA: Diagnosis not present

## 2021-04-11 NOTE — Progress Notes (Signed)
04/11/2021 2:37 PM   [name] 01-18-1946 [id number]  Referring provider: Smitty Cords, DO [address] [place],  [place] [address]  Chief Complaint  Patient presents with   renal cyst    HPI: 75 year old male with a personal history of an incidental left renal abscess at the time of hematuria evaluation in 05/2020 who presents today for follow-up.  He initially presented with hematuria and on CT urogram, was found to have a large completely incidental cystic mass in his kidney measuring 9.7 cm.  He underwent percutaneous drainage.  Cytology was negative.  He did have drainage from the tube for quite some time and ultimately this was able to be removed.  He returns today with repeat serial imaging the form of MRI given the fairly unusual presentation and concern for possible underlying alternative diagnoses.  MRI indicates continued involution of the lesion, now measuring 3.2 x 2.6, previously 4.9 x 4.1 following tube removal.  He did have a cystoscopy for his hematuria evaluation which was unremarkable.  Overall, he continues to do extremely well.  He denies any flank pain, fevers, chills, hematuria, or any other symptoms.   PMH: Past Medical History:  Diagnosis Date   Hypertension     Surgical History: Past Surgical History:  Procedure Laterality Date   APPENDECTOMY     HERNIA REPAIR      Home Medications:  Allergies as of 04/11/2021   No Known Allergies      Medication List        Accurate as of April 11, 2021  2:37 PM. If you have any questions, ask your nurse or doctor.          fluticasone 50 MCG/ACT nasal spray Commonly known as: FLONASE Place 2 sprays into both nostrils daily. Use for 4-6 weeks then stop and use seasonally or as needed.   ipratropium 0.06 % nasal spray Commonly known as: ATROVENT Place 2 sprays into both nostrils 4 (four) times daily as needed for rhinitis.   losartan 50 MG tablet Commonly known as: COZAAR Take 1 tablet  (50 mg total) by mouth daily.   Melatonin 5 MG Caps Take by mouth.   naproxen sodium 220 MG tablet Commonly known as: ALEVE Take 440 mg by mouth daily as needed.   OSTEO BI-FLEX TRIPLE STRENGTH PO Take by mouth.        Allergies: No Known Allergies  Family History: Family History  Problem Relation Age of Onset   Alzheimer's disease Mother    Diabetes Father    Prostate cancer Brother     Social History:  reports that he has been smoking cigarettes. He has a 14.25 pack-year smoking history. He has never used smokeless tobacco. He reports current alcohol use of about 2.0 standard drinks of alcohol per week. He reports that he does not use drugs.   Physical Exam: BP (!) 152/89   Pulse (!) 119   Ht 6\' 4"  (1.93 m)   Wt 254 lb (115.2 kg)   BMI 30.92 kg/m   Constitutional:  Alert and oriented, No acute distress. HEENT: [place] AT, moist mucus membranes.  Trachea midline, no masses. Cardiovascular: No clubbing, cyanosis, or edema. Respiratory: Normal respiratory effort, no increased work of breathing. Skin: No rashes, bruises or suspicious lesions. Neurologic: Grossly intact, no focal deficits, moving all 4 extremities. Psychiatric: Normal mood and affect.  Laboratory Data: Lab Results  Component Value Date   CREATININE 0.75 10/04/2020    Pertinent Imaging:  Adrenals/Urinary Tract:  Normal appearance of adrenal glands. Cystic lesion with thick peripheral enhancing rim is again seen in the lower pole of the left kidney, but is significantly decreased in size since prior exam. This currently measures 3.2 x 2.6 cm on image 28/10, compared to 4.9 x 4.1 cm previously. This is consistent with resolving abscess. Small simple cyst in midpole of left kidney is also stable. No evidence of hydronephrosis.   Stomach/Bowel: Visualized portion unremarkable.   Vascular/Lymphatic: No pathologically enlarged lymph nodes identified. No acute vascular findings.   Other:  None.    Musculoskeletal:  No suspicious bone lesions identified.   IMPRESSION: Significant decrease in size of thick-walled cystic lesion in lower pole of the left kidney, consistent with resolving renal abscess.   No evidence of hydronephrosis or other acute findings.   Mild hepatic steatosis.     Electronically Signed   By: Danae Orleans M.D.   On: 03/29/2021 13:24  Abdominal MRI images were personally reviewed today, continued interval involution of a thick walled lower pole cyst.  There is no features to suggest underlying malignancy.  Assessment & Plan:    1. Renal cyst/ Renal and perinephric abscess Remains asymptomatic, unusual presentation of renal abscess however serial imaging is very reassuring and the lesion continues to involute without any features suggestive of malignancy/negative cytology  Plan for renal ultrasound in a year for reassessment, he is agreeable this plan.  We will to recheck his urine and light of history of hematuria.  Return sooner if he develops any flank pain, fevers chills, or any other unexplained symptoms for earlier reassessment. - Ultrasound renal complete; Future  F/u 1 year for RUS/ UA  Vanna Scotland, MD  [facility] [address], [address] [place], [place] [address] [phone]

## 2021-04-20 ENCOUNTER — Encounter: Payer: Self-pay | Admitting: Urology

## 2021-05-10 ENCOUNTER — Other Ambulatory Visit: Payer: Self-pay | Admitting: Family Medicine

## 2021-05-10 DIAGNOSIS — I10 Essential (primary) hypertension: Secondary | ICD-10-CM

## 2021-05-17 DIAGNOSIS — Z20822 Contact with and (suspected) exposure to covid-19: Secondary | ICD-10-CM | POA: Diagnosis not present

## 2021-07-10 ENCOUNTER — Other Ambulatory Visit: Payer: Medicare Other

## 2021-07-10 ENCOUNTER — Other Ambulatory Visit: Payer: Self-pay

## 2021-08-08 ENCOUNTER — Other Ambulatory Visit: Payer: Medicare Other

## 2021-08-08 DIAGNOSIS — R7303 Prediabetes: Secondary | ICD-10-CM | POA: Diagnosis not present

## 2021-08-08 DIAGNOSIS — E7800 Pure hypercholesterolemia, unspecified: Secondary | ICD-10-CM | POA: Diagnosis not present

## 2021-08-08 DIAGNOSIS — I10 Essential (primary) hypertension: Secondary | ICD-10-CM | POA: Diagnosis not present

## 2021-08-09 LAB — CBC WITH DIFFERENTIAL/PLATELET
Absolute Monocytes: 1414 cells/uL — ABNORMAL HIGH (ref 200–950)
Basophils Absolute: 54 cells/uL (ref 0–200)
Basophils Relative: 0.3 %
Eosinophils Absolute: 322 cells/uL (ref 15–500)
Eosinophils Relative: 1.8 %
HCT: 45.4 % (ref 38.5–50.0)
Hemoglobin: 15.3 g/dL (ref 13.2–17.1)
Lymphs Abs: 3705 cells/uL (ref 850–3900)
MCH: 29.8 pg (ref 27.0–33.0)
MCHC: 33.7 g/dL (ref 32.0–36.0)
MCV: 88.3 fL (ref 80.0–100.0)
MPV: 12.2 fL (ref 7.5–12.5)
Monocytes Relative: 7.9 %
Neutro Abs: 12405 cells/uL — ABNORMAL HIGH (ref 1500–7800)
Neutrophils Relative %: 69.3 %
Platelets: 216 10*3/uL (ref 140–400)
RBC: 5.14 10*6/uL (ref 4.20–5.80)
RDW: 12.8 % (ref 11.0–15.0)
Total Lymphocyte: 20.7 %
WBC: 17.9 10*3/uL — ABNORMAL HIGH (ref 3.8–10.8)

## 2021-08-09 LAB — LIPID PANEL
Cholesterol: 220 mg/dL — ABNORMAL HIGH (ref <200)
HDL: 45 mg/dL (ref >=40)
LDL Cholesterol (Calc): 154 mg/dL (calc) — ABNORMAL HIGH
Non-HDL Cholesterol (Calc): 175 mg/dL (calc) — ABNORMAL HIGH (ref <130)
Total CHOL/HDL Ratio: 4.9 (calc) (ref <5.0)
Triglycerides: 99 mg/dL (ref <150)

## 2021-08-09 LAB — COMPLETE METABOLIC PANEL WITH GFR
AG Ratio: 1.6 (calc) (ref 1.0–2.5)
ALT: 21 U/L (ref 9–46)
AST: 22 U/L (ref 10–35)
Albumin: 4.7 g/dL (ref 3.6–5.1)
Alkaline phosphatase (APISO): 59 U/L (ref 35–144)
BUN: 18 mg/dL (ref 7–25)
CO2: 28 mmol/L (ref 20–32)
Calcium: 9.8 mg/dL (ref 8.6–10.3)
Chloride: 102 mmol/L (ref 98–110)
Creat: 0.78 mg/dL (ref 0.70–1.28)
Globulin: 2.9 g/dL (calc) (ref 1.9–3.7)
Glucose, Bld: 94 mg/dL (ref 65–99)
Potassium: 4.6 mmol/L (ref 3.5–5.3)
Sodium: 140 mmol/L (ref 135–146)
Total Bilirubin: 0.6 mg/dL (ref 0.2–1.2)
Total Protein: 7.6 g/dL (ref 6.1–8.1)
eGFR: 93 mL/min/{1.73_m2} (ref >=60)

## 2021-08-09 LAB — HEMOGLOBIN A1C
Hgb A1c MFr Bld: 5.9 % of total Hgb — ABNORMAL HIGH (ref <5.7)
Mean Plasma Glucose: 123 mg/dL
eAG (mmol/L): 6.8 mmol/L

## 2021-08-09 LAB — PSA: PSA: 3.93 ng/mL (ref <=4.00)

## 2021-08-15 ENCOUNTER — Other Ambulatory Visit: Payer: Self-pay

## 2021-08-15 ENCOUNTER — Ambulatory Visit (INDEPENDENT_AMBULATORY_CARE_PROVIDER_SITE_OTHER): Payer: Medicare Other | Admitting: Family Medicine

## 2021-08-15 ENCOUNTER — Encounter: Payer: Self-pay | Admitting: Family Medicine

## 2021-08-15 ENCOUNTER — Other Ambulatory Visit: Payer: Self-pay | Admitting: Family Medicine

## 2021-08-15 VITALS — BP 140/80 | HR 107 | Ht 76.0 in | Wt 248.6 lb

## 2021-08-15 DIAGNOSIS — N401 Enlarged prostate with lower urinary tract symptoms: Secondary | ICD-10-CM

## 2021-08-15 DIAGNOSIS — E7800 Pure hypercholesterolemia, unspecified: Secondary | ICD-10-CM

## 2021-08-15 DIAGNOSIS — J432 Centrilobular emphysema: Secondary | ICD-10-CM

## 2021-08-15 DIAGNOSIS — I10 Essential (primary) hypertension: Secondary | ICD-10-CM

## 2021-08-15 DIAGNOSIS — I700 Atherosclerosis of aorta: Secondary | ICD-10-CM

## 2021-08-15 DIAGNOSIS — M25562 Pain in left knee: Secondary | ICD-10-CM

## 2021-08-15 DIAGNOSIS — Z0000 Encounter for general adult medical examination without abnormal findings: Secondary | ICD-10-CM

## 2021-08-15 DIAGNOSIS — G8929 Other chronic pain: Secondary | ICD-10-CM

## 2021-08-15 DIAGNOSIS — J3089 Other allergic rhinitis: Secondary | ICD-10-CM | POA: Diagnosis not present

## 2021-08-15 DIAGNOSIS — M25561 Pain in right knee: Secondary | ICD-10-CM | POA: Diagnosis not present

## 2021-08-15 DIAGNOSIS — R7303 Prediabetes: Secondary | ICD-10-CM | POA: Diagnosis not present

## 2021-08-15 DIAGNOSIS — Z23 Encounter for immunization: Secondary | ICD-10-CM

## 2021-08-15 DIAGNOSIS — M170 Bilateral primary osteoarthritis of knee: Secondary | ICD-10-CM

## 2021-08-15 DIAGNOSIS — E669 Obesity, unspecified: Secondary | ICD-10-CM

## 2021-08-15 DIAGNOSIS — N138 Other obstructive and reflux uropathy: Secondary | ICD-10-CM

## 2021-08-15 DIAGNOSIS — R972 Elevated prostate specific antigen [PSA]: Secondary | ICD-10-CM

## 2021-08-15 MED ORDER — FLUTICASONE PROPIONATE 50 MCG/ACT NA SUSP: 2.0000 | Freq: Every day | NASAL | 3 refills | Status: DC

## 2021-08-15 NOTE — Assessment & Plan Note (Signed)
Chronic COPD emphysema Relatively new diagnosis of chronic problem, with active tobacco abuse long history Declined maintenance therapy inhalers  Counseling provided Follow yearly LDCT Lung Scan 12/2021

## 2021-08-15 NOTE — Assessment & Plan Note (Signed)
BPH LUTS See below, elevated PSA Repeat in 3 months Notify Dr Apolinar Junes

## 2021-08-15 NOTE — Assessment & Plan Note (Signed)
Controlled HTN. - Home BP readings improved avg  No known complications     Plan:  1. Continue current med - Losartan 50mg daily 2. Encourage improved lifestyle - low sodium diet, regular exercise 3. Continue monitor BP outside office, bring readings to next visit, if persistently >140/90 or new symptoms notify office sooner 

## 2021-08-15 NOTE — Assessment & Plan Note (Signed)
Previously controlled cholesterol on statin and lifestyle Last lipid panel 07/2021 elevated The 10-year ASCVD risk score (Arnett DK, et al., 2019) is: 36.8%  Plan: 1. RESTART Statin at intermittent dosing 2 x weekly

## 2021-08-15 NOTE — Patient Instructions (Addendum)
Thank you for coming to the office today.  For sinus pressure / ears  Start nasal steroid Flonase 2 sprays in each nostril daily for 4-6 weeks, may repeat course seasonally or as needed  If not helping let me know we can refer to ENT specialist  -------  Elevated PSA lab test now, this could be due to enlarged prostate. I will ask Dr Apolinar Junes her opinion and we can repeat this again in future or she can follow up on it.  ---------------------   Referral to Ortho for Knee pain / arthritis  [facility] [address] [place], [place]  [address] Phone: [phone]  DUE for FASTING BLOOD WORK (no food or drink after midnight before the lab appointment, only water or coffee without cream/sugar on the morning of)  SCHEDULE "Lab Only" visit in the morning at the clinic for lab draw in 3 MONTHS   - Make sure Lab Only appointment is at about 1 week before your next appointment, so that results will be available  For Lab Results, once available within 2-3 days of blood draw, you can can log in to MyChart online to view your results and a brief explanation. Also, we can discuss results at next follow-up visit.   Please schedule a Follow-up Appointment to: Return in about 3 months (around 11/15/2021) for 3 month lab only then 1 week later Follow-up PSA / Sinus / Ortho updates.  If you have any other questions or concerns, please feel free to call the office or send a message through MyChart. You may also schedule an earlier appointment if necessary.  Additionally, you may be receiving a survey about your experience at our office within a few days to 1 week by e-mail or mail. We value your feedback.  Saralyn Pilar, DO [facility], [place]

## 2021-08-15 NOTE — Assessment & Plan Note (Signed)
Chronic OA DJD bilateral knees Prior x-ray 2019 WIll refer to Orthopedics if/when patient ready

## 2021-08-15 NOTE — Progress Notes (Signed)
Subjective:    Patient ID: Jesse Gardner, male    DOB: 1946-03-21, 75 y.o.   MRN: [id number]  Jesse Gardner is a 75 y.o. male presenting on 08/15/2021 for Annual Exam   HPI  Here for Annual Physical and Lab Review.  Pre-Diabetes Stable A1c 5.9 (prior range 5.7 to 6.0) Meds: None He is on ARB Lifestyle: - Diet (Reduced sugar added to tea and coffee, he has reduced desserts even more now, less cobbler) - Exercise (limited due to knee arthritis) Denies hypoglycemia, polyuria, visual changes, numbness or tingling.   Essential Hypertension / Obesity BMI >30 Home BP has been in normal range mostly He does take Sudafed for sinuses often. Sometimes raised BP Current Meds - Losartan $RemoveBe'50mg'aKJegTBdt$  daily Lifestyle: - Diet: Drinks up to pot of coffee daily, drinks tea. Not drinking soft drinks. - Exercise: No longer going to the gym. Limited exercise Denies CP, dyspnea, HA, edema, dizziness / lightheadedness   HYPERLIPIDEMIA: Last lipid panel 07/2021 elevated LDL >150 now off statin Was improved before. Previously On Rosuvastatin $RemoveBeforeD'10mg'IAODpNZQVSFknq$  - He had side effect with loose bowels after a few weeks or months on medicine. 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 He uses Flonase regularly, but says needs something else. He takes Claritin D occasionally or Sudafed PRN.  Aortic Atherosclerosis On CT imaging last 12/2020 Previous on statin Active smoker Restart statin.  Centrilobular Emphysema Tobacco Abuse Following w/ LDCT screening last done 12/2020, due yearly Active smoker Not on maintenance therapy   Health Maintenance: Due for Flu Shot, will receive today   Cologuard done 07/2020, negative, repeat in 3 years or 2024  Due for updated COVID Booster when ready  Depression screen [facility] 2/9 08/15/2021 09/27/2020 07/12/2020  Decreased Interest 0 0 0  Down, Depressed, Hopeless 0 1 0  PHQ - 2 Score 0 1 0  Altered sleeping 0 - -   Tired, decreased energy 0 - -  Change in appetite 0 - -  Feeling bad or failure about yourself  0 - -  Trouble concentrating 0 - -  Moving slowly or fidgety/restless 0 - -  Suicidal thoughts 0 - -  PHQ-9 Score 0 - -  Difficult doing work/chores Not difficult at all - -    Past Medical History:  Diagnosis Date   Hypertension    Past Surgical History:  Procedure Laterality Date   APPENDECTOMY     HERNIA REPAIR     Social History   Socioeconomic History   Marital status: Significant Other    Spouse name: Not on file   Number of children: Not on file   Years of education: Not on file   Highest education level: Not on file  Occupational History   Occupation: retired  Tobacco Use   Smoking status: Some Days    Packs/day: 0.25    Years: 57.00    Pack years: 14.25    Types: Cigarettes   Smokeless tobacco: Never   Tobacco comments:    1 cigarette in the last month   Vaping Use   Vaping Use: Never used  Substance and Sexual Activity   Alcohol use: Yes    Alcohol/week: 2.0 standard drinks    Types: 2 Glasses of wine per week   Drug use: Never   Sexual activity: Not on file  Other Topics Concern   Not on file  Social History Narrative   Not on file   Social Determinants  of Health   Financial Resource Strain: Low Risk    Difficulty of Paying Living Expenses: Not hard at all  Food Insecurity: No Food Insecurity   Worried About Gypsy in the Last Year: Never true   Surprise in the Last Year: Never true  Transportation Needs: No Transportation Needs   Lack of Transportation (Medical): No   Lack of Transportation (Non-Medical): No  Physical Activity: Inactive   Days of Exercise per Week: 0 days   Minutes of Exercise per Session: 0 min  Stress: No Stress Concern Present   Feeling of Stress : Only a little  Social Connections: Not on file  Intimate Partner Violence: Not on file   Family History  Problem Relation Age of Onset   Alzheimer's  disease Mother    Diabetes Father    Prostate cancer Brother    Current Outpatient Medications on File Prior to Visit  Medication Sig   ipratropium (ATROVENT) 0.06 % nasal spray Place 2 sprays into both nostrils 4 (four) times daily as needed for rhinitis.   losartan (COZAAR) 50 MG tablet TAKE 1 TABLET(50 MG) BY MOUTH DAILY   Melatonin 5 MG CAPS Take by mouth.    Misc Natural Products (OSTEO BI-FLEX TRIPLE STRENGTH PO) Take by mouth.   naproxen sodium (ALEVE) 220 MG tablet Take 440 mg by mouth daily as needed.   rosuvastatin (CRESTOR) 10 MG tablet Take 10 mg by mouth 2 (two) times a week.   No current facility-administered medications on file prior to visit.    Review of Systems  Constitutional:  Negative for activity change, appetite change, chills, diaphoresis, fatigue and fever.  HENT:  Negative for congestion and hearing loss.   Eyes:  Negative for visual disturbance.  Respiratory:  Negative for cough, chest tightness, shortness of breath and wheezing.   Cardiovascular:  Negative for chest pain, palpitations and leg swelling.  Gastrointestinal:  Negative for abdominal pain, constipation, diarrhea, nausea and vomiting.  Genitourinary:  Negative for dysuria, frequency and hematuria.  Musculoskeletal:  Positive for arthralgias. Negative for neck pain.  Skin:  Negative for rash.  Allergic/Immunologic: Negative for environmental allergies.  Neurological:  Negative for dizziness, weakness, light-headedness, numbness and headaches.  Hematological:  Negative for adenopathy.  Psychiatric/Behavioral:  Negative for behavioral problems, dysphoric mood and sleep disturbance.   Per HPI unless specifically indicated above      Objective:    BP 140/80   Pulse (!) 107   Ht $R'6\' 4"'Cj$  (1.93 m)   Wt 248 lb 9.6 oz (112.8 kg)   SpO2 93%   BMI 30.26 kg/m   Wt Readings from Last 3 Encounters:  08/15/21 248 lb 9.6 oz (112.8 kg)  04/11/21 254 lb (115.2 kg)  01/12/21 259 lb 6.4 oz (117.7 kg)     Physical Exam Vitals and nursing note reviewed.  Constitutional:      General: He is not in acute distress.    Appearance: He is well-developed. He is not diaphoretic.     Comments: Well-appearing, comfortable, cooperative  HENT:     Head: Normocephalic and atraumatic.  Eyes:     General:        Right eye: No discharge.        Left eye: No discharge.     Conjunctiva/sclera: Conjunctivae normal.     Pupils: Pupils are equal, round, and reactive to light.  Neck:     Thyroid: No thyromegaly.     Vascular: No carotid bruit.  Cardiovascular:     Rate and Rhythm: Normal rate and regular rhythm.     Pulses: Normal pulses.     Heart sounds: Normal heart sounds. No murmur heard. Pulmonary:     Effort: Pulmonary effort is normal. No respiratory distress.     Breath sounds: Normal breath sounds. No wheezing or rales.  Abdominal:     General: Bowel sounds are normal. There is no distension.     Palpations: Abdomen is soft. There is no mass.     Tenderness: There is no abdominal tenderness.  Musculoskeletal:        General: No tenderness. Normal range of motion.     Cervical back: Normal range of motion and neck supple.     Right lower leg: No edema.     Left lower leg: No edema.     Comments: Upper / Lower Extremities: - Normal muscle tone, strength bilateral upper extremities 5/5, lower extremities 5/5  Lymphadenopathy:     Cervical: No cervical adenopathy.  Skin:    General: Skin is warm and dry.     Findings: No erythema or rash.  Neurological:     Mental Status: He is alert and oriented to person, place, and time.     Comments: Distal sensation intact to light touch all extremities  Psychiatric:        Mood and Affect: Mood normal.        Behavior: Behavior normal.        Thought Content: Thought content normal.     Comments: Well groomed, good eye contact, normal speech and thoughts    I have personally reviewed the radiology report from 12/271/9.  CLINICAL DATA:  Pt  c/o bilateral knee pain, R > L, chronic wear and tear, suspected osteo arthritis   EXAM: BILATERAL KNEES STANDING - 1 VIEW   COMPARISON:  None.   FINDINGS: There is marked bilateral medial joint space compartment narrowing with mild subchondral sclerosis of the medial tibial plateaus. Marginal osteophytes project from the mediolateral compartments. Knee appearance is relatively symmetric.   No fracture or bone lesion.   Soft tissues are unremarkable.   IMPRESSION: Advanced bilateral osteoarthritis predominantly affecting the medial compartments. No fracture or acute finding.     Electronically Signed   By: Lajean Manes M.D.   On: 10/24/2018 12:16  -----  CLINICAL DATA:  One hundred four pack-year smoking history, quitting 6 months ago.   EXAM: CT CHEST WITHOUT CONTRAST LOW-DOSE FOR LUNG CANCER SCREENING   TECHNIQUE: Multidetector CT imaging of the chest was performed following the standard protocol without IV contrast.   COMPARISON:  11/13/2019   FINDINGS: Cardiovascular: Aortic atherosclerosis. Tortuous thoracic aorta. Normal heart size, without pericardial effusion. Multivessel coronary artery atherosclerosis. Aortic valve calcification.   Mediastinum/Nodes: No mediastinal or definite hilar adenopathy, given limitations of unenhanced CT.   Lungs/Pleura: No pleural fluid. Mild centrilobular emphysema. Mild biapical pleuroparenchymal scarring.   Pulmonary nodules of maximally volume derived equivalent diameter 5.7 mm are not significantly changed.   Upper Abdomen: Normal imaged portions of the liver, spleen, stomach, gallbladder, pancreas, adrenal glands, kidneys.   Musculoskeletal: No acute osseous abnormality.   IMPRESSION: 1. Lung-RADS 2, benign appearance or behavior. Continue annual screening with low-dose chest CT without contrast in 12 months. 2. Aortic Atherosclerosis (ICD10-I70.0) and Emphysema (ICD10-J43.9). Coronary artery  atherosclerosis. 3. Aortic valvular calcifications. Consider echocardiography to evaluate for valvular dysfunction.     Electronically Signed   By: Abigail Miyamoto M.D.   On:  01/12/2021 09:30    Results for orders placed or performed in visit on 08/08/21  Hemoglobin A1c  Result Value Ref Range   Hgb A1c MFr Bld 5.9 (H) <5.7 % of total Hgb   Mean Plasma Glucose 123 mg/dL   eAG (mmol/L) 6.8 mmol/L  CBC with Differential/Platelet  Result Value Ref Range   WBC 17.9 (H) 3.8 - 10.8 Thousand/uL   RBC 5.14 4.20 - 5.80 Million/uL   Hemoglobin 15.3 13.2 - 17.1 g/dL   HCT 45.4 38.5 - 50.0 %   MCV 88.3 80.0 - 100.0 fL   MCH 29.8 27.0 - 33.0 pg   MCHC 33.7 32.0 - 36.0 g/dL   RDW 12.8 11.0 - 15.0 %   Platelets 216 140 - 400 Thousand/uL   MPV 12.2 7.5 - 12.5 fL   Neutro Abs 12,405 (H) 1,500 - 7,800 cells/uL   Lymphs Abs 3,705 850 - 3,900 cells/uL   Absolute Monocytes 1,414 (H) 200 - 950 cells/uL   Eosinophils Absolute 322 15 - 500 cells/uL   Basophils Absolute 54 0 - 200 cells/uL   Neutrophils Relative % 69.3 %   Total Lymphocyte 20.7 %   Monocytes Relative 7.9 %   Eosinophils Relative 1.8 %   Basophils Relative 0.3 %  COMPLETE METABOLIC PANEL WITH GFR  Result Value Ref Range   Glucose, Bld 94 65 - 99 mg/dL   BUN 18 7 - 25 mg/dL   Creat 0.78 0.70 - 1.28 mg/dL   eGFR 93 > OR = 60 mL/min/1.77m2   BUN/Creatinine Ratio NOT APPLICABLE 6 - 22 (calc)   Sodium 140 135 - 146 mmol/L   Potassium 4.6 3.5 - 5.3 mmol/L   Chloride 102 98 - 110 mmol/L   CO2 28 20 - 32 mmol/L   Calcium 9.8 8.6 - 10.3 mg/dL   Total Protein 7.6 6.1 - 8.1 g/dL   Albumin 4.7 3.6 - 5.1 g/dL   Globulin 2.9 1.9 - 3.7 g/dL (calc)   AG Ratio 1.6 1.0 - 2.5 (calc)   Total Bilirubin 0.6 0.2 - 1.2 mg/dL   Alkaline phosphatase (APISO) 59 35 - 144 U/L   AST 22 10 - 35 U/L   ALT 21 9 - 46 U/L  Lipid panel  Result Value Ref Range   Cholesterol 220 (H) <200 mg/dL   HDL 45 > OR = 40 mg/dL   Triglycerides 99 <150 mg/dL    LDL Cholesterol (Calc) 154 (H) mg/dL (calc)   Total CHOL/HDL Ratio 4.9 <5.0 (calc)   Non-HDL Cholesterol (Calc) 175 (H) <130 mg/dL (calc)  PSA  Result Value Ref Range   PSA 3.93 < OR = 4.00 ng/mL      Assessment & Plan:   Problem List Items Addressed This Visit     Pure hypercholesterolemia    Previously controlled cholesterol on statin and lifestyle Last lipid panel 07/2021 elevated The 10-year ASCVD risk score (Arnett DK, et al., 2019) is: 36.8%  Plan: 1. RESTART Statin at intermittent dosing 2 x weekly       Relevant Medications   rosuvastatin (CRESTOR) 10 MG tablet   Pre-diabetes    Mild elevated A1c 5.9 Concern with obesity, HTN, HLD  Plan:  1. Not on any therapy currently  2. Encourage improved lifestyle - low carb, low sugar diet, reduce portion size, continue improving regular exercise      Osteoarthritis of knees, bilateral    Chronic OA DJD bilateral knees Prior x-ray 2019 WIll refer to Orthopedics if/when patient ready  Obesity (BMI 30.0-34.9)   Essential hypertension    Controlled HTN. - Home BP readings improved avg  No known complications     Plan:  1. Continue current med - Losartan $RemoveBe'50mg'UPHswjsrj$  daily 2. Encourage improved lifestyle - low sodium diet, regular exercise 3. Continue monitor BP outside office, bring readings to next visit, if persistently >140/90 or new symptoms notify office sooner      Relevant Medications   rosuvastatin (CRESTOR) 10 MG tablet   Centrilobular emphysema ([place])    Chronic COPD emphysema Relatively new diagnosis of chronic problem, with active tobacco abuse long history Declined maintenance therapy inhalers  Counseling provided Follow yearly LDCT Lung Scan 12/2021      BPH with obstruction/lower urinary tract symptoms    BPH LUTS See below, elevated PSA Repeat in 3 months Notify Dr Erlene Quan      Bilateral chronic knee pain   Allergic rhinitis due to allergen   Other Visit Diagnoses     Annual physical exam     -  Primary   Needs flu shot       Relevant Orders   Flu Vaccine QUAD High Dose(Fluad) (Completed)   Elevated PSA, less than 10 ng/ml       Aortic atherosclerosis ([facility])       Relevant Medications   rosuvastatin (CRESTOR) 10 MG tablet       Updated Health Maintenance information Flu Shot today Consider COVID19 booster when ready, he is considering it Cologuard negative 07/2020 Reviewed recent lab results with patient Encouraged improvement to lifestyle with diet and exercise Goal of weight loss  Elevated PSA 3.93 - his next apt with [facility] Dr Erlene Quan for kidney cyst, next in 03/2022. Previous PSA 1.5. Brother has history of Prostate Cancer.  Forward chart today to Dr Erlene Quan for review on PSA  Will check PSA repeat in 3 months and forward to Dr Erlene Quan for review   No orders of the defined types were placed in this encounter.     Follow up plan: Return in about 3 months (around 11/15/2021) for 3 month lab only then 1 week later Follow-up PSA / Sinus / Ortho updates.  Nobie Putnam, DO [facility] 08/15/2021, 1:33 PM

## 2021-08-15 NOTE — Assessment & Plan Note (Signed)
Mild elevated A1c 5.9 Concern with obesity, HTN, HLD  Plan:  1. Not on any therapy currently  2. Encourage improved lifestyle - low carb, low sugar diet, reduce portion size, continue improving regular exercise

## 2021-08-28 DIAGNOSIS — M170 Bilateral primary osteoarthritis of knee: Secondary | ICD-10-CM | POA: Diagnosis not present

## 2021-08-28 DIAGNOSIS — G8929 Other chronic pain: Secondary | ICD-10-CM | POA: Diagnosis not present

## 2021-08-28 DIAGNOSIS — R7303 Prediabetes: Secondary | ICD-10-CM | POA: Diagnosis not present

## 2021-08-28 DIAGNOSIS — M25562 Pain in left knee: Secondary | ICD-10-CM | POA: Diagnosis not present

## 2021-08-28 DIAGNOSIS — M25561 Pain in right knee: Secondary | ICD-10-CM | POA: Diagnosis not present

## 2021-09-13 DIAGNOSIS — M1711 Unilateral primary osteoarthritis, right knee: Secondary | ICD-10-CM | POA: Insufficient documentation

## 2021-09-28 ENCOUNTER — Ambulatory Visit: Payer: Medicare Other

## 2021-10-13 ENCOUNTER — Ambulatory Visit (INDEPENDENT_AMBULATORY_CARE_PROVIDER_SITE_OTHER): Payer: Medicare Other

## 2021-10-13 ENCOUNTER — Other Ambulatory Visit: Payer: Self-pay

## 2021-10-13 VITALS — BP 142/78 | HR 90 | Temp 97.7°F | Ht 76.0 in | Wt 252.6 lb

## 2021-10-13 DIAGNOSIS — Z0000 Encounter for general adult medical examination without abnormal findings: Secondary | ICD-10-CM

## 2021-10-13 NOTE — Progress Notes (Signed)
I have reviewed this encounter including the documentation in this note by Hal Hope, LPN  I am certifying that I agree with the content of this note as this patient's Primary Care Physician.  Saralyn Pilar, DO [facility] 10/13/2021, 11:26 AM

## 2021-10-13 NOTE — Patient Instructions (Signed)
Jesse Gardner , Thank you for taking time to come for your Medicare Wellness Visit. I appreciate your ongoing commitment to your health goals. Please review the following plan we discussed and let me know if I can assist you in the future.   Screening recommendations/referrals: Colonoscopy: cologuard 01/20/20 Recommended yearly ophthalmology/optometry visit for glaucoma screening and checkup Recommended yearly dental visit for hygiene and checkup  Vaccinations: Influenza vaccine: 08/15/21 Pneumococcal vaccine: n/d Tdap vaccine: n/d Shingles vaccine: n/d   Covid-19: 12/30/19, 01/20/20  Advanced directives: no  Conditions/risks identified: none  Next appointment: Follow up in one year for your annual wellness visit. 10/19/22 @ 10:20  Preventive Care 75 Years and Older, Male Preventive care refers to lifestyle choices and visits with your health care provider that can promote health and wellness. What does preventive care include? A yearly physical exam. This is also called an annual well check. Dental exams once or twice a year. Routine eye exams. Ask your health care provider how often you should have your eyes checked. Personal lifestyle choices, including: Daily care of your teeth and gums. Regular physical activity. Eating a healthy diet. Avoiding tobacco and drug use. Limiting alcohol use. Practicing safe sex. Taking low doses of aspirin every day. Taking vitamin and mineral supplements as recommended by your health care provider. What happens during an annual well check? The services and screenings done by your health care provider during your annual well check will depend on your age, overall health, lifestyle risk factors, and family history of disease. Counseling  Your health care provider may ask you questions about your: Alcohol use. Tobacco use. Drug use. Emotional well-being. Home and relationship well-being. Sexual activity. Eating habits. History of falls. Memory  and ability to understand (cognition). Work and work Astronomer. Screening  You may have the following tests or measurements: Height, weight, and BMI. Blood pressure. Lipid and cholesterol levels. These may be checked every 5 years, or more frequently if you are over 75 years old. Skin check. Lung cancer screening. You may have this screening every year starting at age 75 if you have a 30-pack-year history of smoking and currently smoke or have quit within the past 15 years. Fecal occult blood test (FOBT) of the stool. You may have this test every year starting at age 75. Flexible sigmoidoscopy or colonoscopy. You may have a sigmoidoscopy every 5 years or a colonoscopy every 10 years starting at age 75. Prostate cancer screening. Recommendations will vary depending on your family history and other risks. Hepatitis C blood test. Hepatitis B blood test. Sexually transmitted disease (STD) testing. Diabetes screening. This is done by checking your blood sugar (glucose) after you have not eaten for a while (fasting). You may have this done every 1-3 years. Abdominal aortic aneurysm (AAA) screening. You may need this if you are a current or former smoker. Osteoporosis. You may be screened starting at age 75 if you are at high risk. Talk with your health care provider about your test results, treatment options, and if necessary, the need for more tests. Vaccines  Your health care provider may recommend certain vaccines, such as: Influenza vaccine. This is recommended every year. Tetanus, diphtheria, and acellular pertussis (Tdap, Td) vaccine. You may need a Td booster every 10 years. Zoster vaccine. You may need this after age 75. Pneumococcal 13-valent conjugate (PCV13) vaccine. One dose is recommended after age 75. Pneumococcal polysaccharide (PPSV23) vaccine. One dose is recommended after age 75. Talk to your health care provider about which  screenings and vaccines you need and how often you  need them. This information is not intended to replace advice given to you by your health care provider. Make sure you discuss any questions you have with your health care provider. Document Released: 11/11/2015 Document Revised: 07/04/2016 Document Reviewed: 08/16/2015 Elsevier Interactive Patient Education  2017 [place] Prevention in the Home Falls can cause injuries. They can happen to people of all ages. There are many things you can do to make your home safe and to help prevent falls. What can I do on the outside of my home? Regularly fix the edges of walkways and driveways and fix any cracks. Remove anything that might make you trip as you walk through a door, such as a raised step or threshold. Trim any bushes or trees on the path to your home. Use bright outdoor lighting. Clear any walking paths of anything that might make someone trip, such as rocks or tools. Regularly check to see if handrails are loose or broken. Make sure that both sides of any steps have handrails. Any raised decks and porches should have guardrails on the edges. Have any leaves, snow, or ice cleared regularly. Use sand or salt on walking paths during winter. Clean up any spills in your garage right away. This includes oil or grease spills. What can I do in the bathroom? Use night lights. Install grab bars by the toilet and in the tub and shower. Do not use towel bars as grab bars. Use non-skid mats or decals in the tub or shower. If you need to sit down in the shower, use a plastic, non-slip stool. Keep the floor dry. Clean up any water that spills on the floor as soon as it happens. Remove soap buildup in the tub or shower regularly. Attach bath mats securely with double-sided non-slip rug tape. Do not have throw rugs and other things on the floor that can make you trip. What can I do in the bedroom? Use night lights. Make sure that you have a light by your bed that is easy to reach. Do not  use any sheets or blankets that are too big for your bed. They should not hang down onto the floor. Have a firm chair that has side arms. You can use this for support while you get dressed. Do not have throw rugs and other things on the floor that can make you trip. What can I do in the kitchen? Clean up any spills right away. Avoid walking on wet floors. Keep items that you use a lot in easy-to-reach places. If you need to reach something above you, use a strong step stool that has a grab bar. Keep electrical cords out of the way. Do not use floor polish or wax that makes floors slippery. If you must use wax, use non-skid floor wax. Do not have throw rugs and other things on the floor that can make you trip. What can I do with my stairs? Do not leave any items on the stairs. Make sure that there are handrails on both sides of the stairs and use them. Fix handrails that are broken or loose. Make sure that handrails are as long as the stairways. Check any carpeting to make sure that it is firmly attached to the stairs. Fix any carpet that is loose or worn. Avoid having throw rugs at the top or bottom of the stairs. If you do have throw rugs, attach them to the floor with carpet  tape. Make sure that you have a light switch at the top of the stairs and the bottom of the stairs. If you do not have them, ask someone to add them for you. What else can I do to help prevent falls? Wear shoes that: Do not have high heels. Have rubber bottoms. Are comfortable and fit you well. Are closed at the toe. Do not wear sandals. If you use a stepladder: Make sure that it is fully opened. Do not climb a closed stepladder. Make sure that both sides of the stepladder are locked into place. Ask someone to hold it for you, if possible. Clearly mark and make sure that you can see: Any grab bars or handrails. First and last steps. Where the edge of each step is. Use tools that help you move around (mobility  aids) if they are needed. These include: Canes. Walkers. Scooters. Crutches. Turn on the lights when you go into a dark area. Replace any light bulbs as soon as they burn out. Set up your furniture so you have a clear path. Avoid moving your furniture around. If any of your floors are uneven, fix them. If there are any pets around you, be aware of where they are. Review your medicines with your doctor. Some medicines can make you feel dizzy. This can increase your chance of falling. Ask your doctor what other things that you can do to help prevent falls. This information is not intended to replace advice given to you by your health care provider. Make sure you discuss any questions you have with your health care provider. Document Released: 08/11/2009 Document Revised: 03/22/2016 Document Reviewed: 11/19/2014 Elsevier Interactive Patient Education  2017 [employer].

## 2021-10-13 NOTE — Progress Notes (Signed)
Subjective:   Jesse Gardner is a 75 y.o. male who presents for Medicare Annual/Subsequent preventive examination.  Review of Systems           Objective:    Today's Vitals   10/13/21 1025 10/13/21 1027  BP: (!) 142/78   Pulse: 90   Temp: 97.7 F (36.5 C)   TempSrc: Oral   SpO2: 97%   Weight: 252 lb 9.6 oz (114.6 kg)   Height: 6\' 4"  (1.93 m)   PainSc:  0-No pain   Body mass index is 30.75 kg/m.  Advanced Directives 09/27/2020 09/22/2019  Does Patient Have a Medical Advance Directive? No No  Would patient like information on creating a medical advance directive? - No - Patient declined    Current Medications (verified) Outpatient Encounter Medications as of 10/13/2021  Medication Sig   fluticasone (FLONASE) 50 MCG/ACT nasal spray Place 2 sprays into both nostrils daily. Use for 4-6 weeks then stop and use seasonally or as needed.   ipratropium (ATROVENT) 0.06 % nasal spray Place 2 sprays into both nostrils 4 (four) times daily as needed for rhinitis.   losartan (COZAAR) 50 MG tablet TAKE 1 TABLET(50 MG) BY MOUTH DAILY   Melatonin 5 MG CAPS Take by mouth.    Misc Natural Products (OSTEO BI-FLEX TRIPLE STRENGTH PO) Take by mouth.   naproxen sodium (ALEVE) 220 MG tablet Take 440 mg by mouth daily as needed.   rosuvastatin (CRESTOR) 10 MG tablet Take 10 mg by mouth 2 (two) times a week.   No facility-administered encounter medications on file as of 10/13/2021.    Allergies (verified) Patient has no known allergies.   History: Past Medical History:  Diagnosis Date   Hypertension    Past Surgical History:  Procedure Laterality Date   APPENDECTOMY     HERNIA REPAIR     Family History  Problem Relation Age of Onset   Alzheimer's disease Mother    Diabetes Father    Prostate cancer Brother    Social History   Socioeconomic History   Marital status: Significant Other    Spouse name: Not on file   Number of children: Not on file   Years of education: Not on  file   Highest education level: Not on file  Occupational History   Occupation: retired  Tobacco Use   Smoking status: Some Days    Packs/day: 0.25    Years: 57.00    Pack years: 14.25    Types: Cigarettes   Smokeless tobacco: Never   Tobacco comments:    1 cigarette in the last month   Vaping Use   Vaping Use: Never used  Substance and Sexual Activity   Alcohol use: Yes    Alcohol/week: 2.0 standard drinks    Types: 2 Glasses of wine per week   Drug use: Never   Sexual activity: Not on file  Other Topics Concern   Not on file  Social History Narrative   Not on file   Social Determinants of Health   Financial Resource Strain: Not on file  Food Insecurity: Not on file  Transportation Needs: Not on file  Physical Activity: Not on file  Stress: Not on file  Social Connections: Not on file    Tobacco Counseling Ready to quit: No Counseling given: Yes Tobacco comments: 1 cigarette in the last month    Clinical Intake:  Pre-visit preparation completed: Yes  Pain : No/denies pain Pain Score: 0-No pain     Nutritional Status:  BMI > 30  Obese Nutritional Risks: None Diabetes: No  How often do you need to have someone help you when you read instructions, pamphlets, or other written materials from your doctor or pharmacy?: 1 - Never  Diabetic?no  Interpreter Needed?: No  Information entered by :: Kennedy Bucker, LPN   Activities of Daily Living No flowsheet data found.  Patient Care Team: Smitty Cords, DO as PCP - General (Family Medicine)  Indicate any recent Medical Services you may have received from other than [facility] providers in the past year (date may be approximate).     Assessment:   This is a routine wellness examination for Jesse Gardner.  Hearing/Vision screen No results found.  Dietary issues and exercise activities discussed:     Goals Addressed   None    Depression Screen PHQ 2/9 Scores 08/15/2021 09/27/2020 07/12/2020  01/01/2020 09/22/2019 07/03/2019 10/24/2018  PHQ - 2 Score 0 1 0 0 0 0 0  PHQ- 9 Score 0 - - - - - -    Fall Risk Fall Risk  08/15/2021 09/27/2020 07/12/2020 07/12/2020 01/01/2020  Falls in the past year? 0 0 0 0 0  Number falls in past yr: 0 - 0 0 0  Injury with Fall? 0 - 0 0 0  Risk for fall due to : - Medication side effect - - -  Follow up Falls evaluation completed Falls evaluation completed;Education provided;Falls prevention discussed Falls evaluation completed Falls evaluation completed Falls evaluation completed    FALL RISK PREVENTION PERTAINING TO THE HOME:  Any stairs in or around the home? Yes  If so, are there any without handrails? No  Home free of loose throw rugs in walkways, pet beds, electrical cords, etc? Yes  Adequate lighting in your home to reduce risk of falls? Yes   ASSISTIVE DEVICES UTILIZED TO PREVENT FALLS:  Life alert? No  Use of a cane, walker or w/c? No  Grab bars in the bathroom? No  Shower chair or bench in shower? No  Elevated toilet seat or a handicapped toilet? Yes   TIMED UP AND GO:  Was the test performed? Yes .  Length of time to ambulate 10 feet: 4 sec.   Gait slow and steady without use of assistive device  Cognitive Function:     6CIT Screen 09/27/2020 09/22/2019  What Year? 0 points 0 points  What month? 0 points 0 points  What time? 0 points 0 points  Count back from 20 0 points 0 points  Months in reverse 4 points 0 points  Repeat phrase 2 points 0 points  Total Score 6 0    Immunizations Immunization History  Administered Date(s) Administered   Fluad Quad(high Dose 65+) 07/03/2019, 07/12/2020, 08/15/2021   Influenza, High Dose Seasonal PF 10/24/2018   PFIZER(Purple Top)SARS-COV-2 Vaccination 12/30/2019, 01/20/2020    TDAP status: Due, Education has been provided regarding the importance of this vaccine. Advised may receive this vaccine at local pharmacy or Health Dept. Aware to provide a copy of the vaccination record if  obtained from local pharmacy or Health Dept. Verbalized acceptance and understanding.  Flu Vaccine status: Up to date  Pneumococcal vaccine status: Declined,  Education has been provided regarding the importance of this vaccine but patient still declined. Advised may receive this vaccine at local pharmacy or Health Dept. Aware to provide a copy of the vaccination record if obtained from local pharmacy or Health Dept. Verbalized acceptance and understanding.   Covid-19 vaccine status: Completed vaccines  Qualifies  for Shingles Vaccine? Yes   Zostavax completed No   Shingrix Completed?: No.    Education has been provided regarding the importance of this vaccine. Patient has been advised to call insurance company to determine out of pocket expense if they have not yet received this vaccine. Advised may also receive vaccine at local pharmacy or Health Dept. Verbalized acceptance and understanding.  Screening Tests Health Maintenance  Topic Date Due   Pneumonia Vaccine 24+ Years old (1 - PCV) Never done   TETANUS/TDAP  Never done   Zoster Vaccines- Shingrix (1 of 2) Never done   COVID-19 Vaccine (3 - Booster for Pfizer series) 03/16/2020   Fecal DNA (Cologuard)  08/23/2023   INFLUENZA VACCINE  Completed   Hepatitis C Screening  Completed   HPV VACCINES  Aged Out    Health Maintenance  Health Maintenance Due  Topic Date Due   Pneumonia Vaccine 95+ Years old (1 - PCV) Never done   TETANUS/TDAP  Never done   Zoster Vaccines- Shingrix (1 of 2) Never done   COVID-19 Vaccine (3 - Booster for Pfizer series) 03/16/2020    Colorectal cancer screening: Type of screening: Cologuard. Completed 01/20/20. Repeat every 3 years  Lung Cancer Screening: (Low Dose CT Chest recommended if Age 59-80 years, 30 pack-year currently smoking OR have quit w/in 15years.) does qualify.   Lung Cancer Screening Referral: sees MD for this  Additional Screening:  Hepatitis C Screening: does qualify; Completed  01/19/19  Vision Screening: Recommended annual ophthalmology exams for early detection of glaucoma and other disorders of the eye. Is the patient up to date with their annual eye exam?  Yes  Who is the provider or what is the name of the office in which the patient attends annual eye exams? Dr.Woodard If pt is not established with a provider, would they like to be referred to a provider to establish care? No .   Dental Screening: Recommended annual dental exams for proper oral hygiene  Community Resource Referral / Chronic Care Management: CRR required this visit?  No   CCM required this visit?  No      Plan:     I have personally reviewed and noted the following in the patients chart:   Medical and social history Use of alcohol, tobacco or illicit drugs  Current medications and supplements including opioid prescriptions. Patient is not currently taking opioid prescriptions. Functional ability and status Nutritional status Physical activity Advanced directives List of other physicians Hospitalizations, surgeries, and ER visits in previous 12 months Vitals Screenings to include cognitive, depression, and falls Referrals and appointments  In addition, I have reviewed and discussed with patient certain preventive protocols, quality metrics, and best practice recommendations. A written personalized care plan for preventive services as well as general preventive health recommendations were provided to patient.     Hal Hope, LPN   [id number]   Nurse Notes: none

## 2021-10-24 ENCOUNTER — Other Ambulatory Visit: Payer: Self-pay | Admitting: Family Medicine

## 2021-10-24 DIAGNOSIS — I10 Essential (primary) hypertension: Secondary | ICD-10-CM

## 2021-10-25 NOTE — Telephone Encounter (Signed)
Requested Prescriptions  Pending Prescriptions Disp Refills   losartan (COZAAR) 50 MG tablet [Pharmacy Med Name: LOSARTAN 50MG  TABLETS] 90 tablet 1    Sig: TAKE 1 TABLET(50 MG) BY MOUTH DAILY     Cardiovascular:  Angiotensin Receptor Blockers Failed - 10/24/2021  5:33 AM      Failed - Last BP in normal range    BP Readings from Last 1 Encounters:  10/13/21 (!) 142/78         Passed - Cr in normal range and within 180 days    Creat  Date Value Ref Range Status  08/08/2021 0.78 0.70 - 1.28 mg/dL Final         Passed - K in normal range and within 180 days    Potassium  Date Value Ref Range Status  08/08/2021 4.6 3.5 - 5.3 mmol/L Final         Passed - Patient is not pregnant      Passed - Valid encounter within last 6 months    Recent Outpatient Visits          2 months ago Annual physical exam   [facility], DO   9 months ago Essential hypertension   [facility], Althea Charon, DO   1 year ago Pure hypercholesterolemia   [facility] [place], [place], DO   1 year ago Right otitis media, unspecified otitis media type   [facility], FNP   1 year ago Annual physical exam   [facility], DO      Future Appointments            In 3 weeks Smitty Cords, Althea Charon, DO [facility]   In 5 months [facility], MD [facility]

## 2021-11-07 ENCOUNTER — Other Ambulatory Visit: Payer: Self-pay

## 2021-11-07 DIAGNOSIS — E7800 Pure hypercholesterolemia, unspecified: Secondary | ICD-10-CM

## 2021-11-07 DIAGNOSIS — N138 Other obstructive and reflux uropathy: Secondary | ICD-10-CM

## 2021-11-07 DIAGNOSIS — R972 Elevated prostate specific antigen [PSA]: Secondary | ICD-10-CM

## 2021-11-08 ENCOUNTER — Other Ambulatory Visit: Payer: Medicare Other

## 2021-11-08 DIAGNOSIS — E7800 Pure hypercholesterolemia, unspecified: Secondary | ICD-10-CM | POA: Diagnosis not present

## 2021-11-08 DIAGNOSIS — N138 Other obstructive and reflux uropathy: Secondary | ICD-10-CM | POA: Diagnosis not present

## 2021-11-09 ENCOUNTER — Other Ambulatory Visit: Payer: Self-pay

## 2021-11-09 LAB — LIPID PANEL
Cholesterol: 213 mg/dL — ABNORMAL HIGH (ref <200)
HDL: 39 mg/dL — ABNORMAL LOW (ref >=40)
LDL Cholesterol (Calc): 149 mg/dL (calc) — ABNORMAL HIGH
Non-HDL Cholesterol (Calc): 174 mg/dL (calc) — ABNORMAL HIGH (ref <130)
Total CHOL/HDL Ratio: 5.5 (calc) — ABNORMAL HIGH (ref <5.0)
Triglycerides: 127 mg/dL (ref <150)

## 2021-11-09 LAB — PSA: PSA: 2.07 ng/mL (ref <=4.00)

## 2021-11-11 ENCOUNTER — Other Ambulatory Visit: Payer: Self-pay | Admitting: Family Medicine

## 2021-11-11 DIAGNOSIS — J3089 Other allergic rhinitis: Secondary | ICD-10-CM

## 2021-11-11 NOTE — Telephone Encounter (Signed)
Requested Prescriptions  Pending Prescriptions Disp Refills   fluticasone (FLONASE) 50 MCG/ACT nasal spray [Pharmacy Med Name: FLUTICASONE NASAL SP (120) RX] 48 g 0    Sig: USE 2 SPRAYS IN EACH NOSTRIL DAILY. USE FOR 4 TO 6 WEEKS, THEN STOP AND USE SEASONALLY OR AS NEEDED     Ear, Nose, and Throat: Nasal Preparations - Corticosteroids Passed - 11/11/2021  7:54 AM      Passed - Valid encounter within last 12 months    Recent Outpatient Visits          2 months ago Annual physical exam   [facility] Smitty Cords, DO   10 months ago Essential hypertension   [facility] Althea Charon, Netta Neat, DO   1 year ago Pure hypercholesterolemia   [facility] [place], Netta Neat, DO   1 year ago Right otitis media, unspecified otitis media type   [facility], Jodelle Gross, FNP   1 year ago Annual physical exam   [facility] Smitty Cords, DO      Future Appointments            In 4 days Althea Charon, Netta Neat, DO [facility]   In 5 months Vanna Scotland, MD [facility]

## 2021-11-15 ENCOUNTER — Ambulatory Visit (INDEPENDENT_AMBULATORY_CARE_PROVIDER_SITE_OTHER): Payer: Medicare Other | Admitting: Family Medicine

## 2021-11-15 ENCOUNTER — Encounter: Payer: Self-pay | Admitting: Family Medicine

## 2021-11-15 ENCOUNTER — Other Ambulatory Visit: Payer: Self-pay

## 2021-11-15 ENCOUNTER — Other Ambulatory Visit: Payer: Self-pay | Admitting: Family Medicine

## 2021-11-15 VITALS — BP 138/74 | HR 109 | Ht 76.0 in | Wt 247.2 lb

## 2021-11-15 DIAGNOSIS — N401 Enlarged prostate with lower urinary tract symptoms: Secondary | ICD-10-CM | POA: Diagnosis not present

## 2021-11-15 DIAGNOSIS — M25561 Pain in right knee: Secondary | ICD-10-CM

## 2021-11-15 DIAGNOSIS — I700 Atherosclerosis of aorta: Secondary | ICD-10-CM | POA: Diagnosis not present

## 2021-11-15 DIAGNOSIS — R7303 Prediabetes: Secondary | ICD-10-CM

## 2021-11-15 DIAGNOSIS — N138 Other obstructive and reflux uropathy: Secondary | ICD-10-CM

## 2021-11-15 DIAGNOSIS — E669 Obesity, unspecified: Secondary | ICD-10-CM

## 2021-11-15 DIAGNOSIS — E7800 Pure hypercholesterolemia, unspecified: Secondary | ICD-10-CM

## 2021-11-15 DIAGNOSIS — J432 Centrilobular emphysema: Secondary | ICD-10-CM

## 2021-11-15 DIAGNOSIS — R972 Elevated prostate specific antigen [PSA]: Secondary | ICD-10-CM

## 2021-11-15 DIAGNOSIS — M25562 Pain in left knee: Secondary | ICD-10-CM

## 2021-11-15 DIAGNOSIS — Z0000 Encounter for general adult medical examination without abnormal findings: Secondary | ICD-10-CM

## 2021-11-15 DIAGNOSIS — M170 Bilateral primary osteoarthritis of knee: Secondary | ICD-10-CM

## 2021-11-15 DIAGNOSIS — I10 Essential (primary) hypertension: Secondary | ICD-10-CM

## 2021-11-15 DIAGNOSIS — G8929 Other chronic pain: Secondary | ICD-10-CM | POA: Diagnosis not present

## 2021-11-15 NOTE — Assessment & Plan Note (Addendum)
Chronic COPD emphysema chronic problem, with active tobacco abuse long history Declined maintenance therapy inhalers previously - but he agrees to TRIAL one today on free sample Breztri given 1 week, he can contact us back to order if interested  *NOTE - Update we are OUT OF STOCK on Breztri samples, we will check with rep and notify patient when back in stock*  Counseling provided Follow yearly LDCT Lung Scan 12/2021

## 2021-11-15 NOTE — Assessment & Plan Note (Signed)
BPH LUTS Improved PSA back to normal range F/u w Dr Kallie Locks

## 2021-11-15 NOTE — Assessment & Plan Note (Signed)
Chronic OA DJD bilateral knees Followed by Ronalee Belts Dr Joice Lofts Future TKR scheduling for R Knee first

## 2021-11-15 NOTE — Progress Notes (Signed)
Subjective:    Patient ID: Jesse Gardner, male    DOB: 1946-06-10, 76 y.o.   MRN: [id number]  Jesse Gardner is a 76 y.o. male presenting on 11/15/2021 for Follow-up and Hyperlipidemia   HPI   Essential Hypertension / Obesity BMI >30 Home BP has been in normal range mostly He does take Sudafed for sinuses often. Sometimes raised BP Current Meds - Losartan 50mg  daily Lifestyle: - Diet: Drinks up to pot of coffee daily, drinks tea. Not drinking soft drinks. - Exercise: No longer going to the gym. Limited exercise Denies CP, dyspnea, HA, edema, dizziness / lightheadedness   HYPERLIPIDEMIA: Last lipid panel 10/2021 - still elevated LDL 149 Was improved before. Previously On Rosuvastatin 10mg  - He had side effect with loose bowels after a few weeks or months on medicine. 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 Recently bothering him with sinus pressure and ear popping fullness, he had some sinus discomfort and pressure now improving. - Taking Flonase  He takes Claritin D occasionally or Sudafed PRN   Aortic Atherosclerosis On CT imaging last 12/2020 On intermittent statin Active smoker   Centrilobular Emphysema Tobacco Abuse Following w/ LDCT screening last done 12/2020, due yearly Active smoker Not on maintenance therapy  Osteoarthritis bilateral Knees Chronic Knee Pain He has followed w/ Dr 01/2021 [facility] He has problems with both knees, R is worse going up an down steps, and Left has more pain. He has plans for future surgery TKR, he prefers them than the [facility]   Health Maintenance: PSA down to 2.07 10/2021, previous range up to 3.93 (3 month ago)  Depression screen [facility] 2/9 11/15/2021 10/13/2021 08/15/2021  Decreased Interest 0 0 0  Down, Depressed, Hopeless 0 0 0  PHQ - 2 Score 0 0 0  Altered sleeping 0 0 0  Tired, decreased energy 0 0 0   Change in appetite 0 0 0  Feeling bad or failure about yourself  0 0 0  Trouble concentrating 0 0 0  Moving slowly or fidgety/restless 0 0 0  Suicidal thoughts 0 0 0  PHQ-9 Score 0 0 0  Difficult doing work/chores Not difficult at all Not difficult at all Not difficult at all    Social History   Tobacco Use   Smoking status: Some Days    Packs/day: 0.25    Years: 57.00    Pack years: 14.25    Types: Cigarettes   Smokeless tobacco: Never   Tobacco comments:    1 cigarette in the last month   Vaping Use   Vaping Use: Never used  Substance Use Topics   Alcohol use: Yes    Alcohol/week: 2.0 standard drinks    Types: 2 Glasses of wine per week   Drug use: Never    Review of Systems Per HPI unless specifically indicated above     Objective:    BP 138/74 (BP Location: Left Arm, Cuff Size: Normal)    Pulse (!) 109    Ht 6\' 4"  (1.93 m)    Wt 247 lb 3.2 oz (112.1 kg)    SpO2 97%    BMI 30.09 kg/m   Wt Readings from Last 3 Encounters:  11/15/21 247 lb 3.2 oz (112.1 kg)  10/13/21 252 lb 9.6 oz (114.6 kg)  08/15/21 248 lb 9.6 oz (112.8 kg)    Physical Exam Vitals and  nursing note reviewed.  Constitutional:      General: He is not in acute distress.    Appearance: He is well-developed. He is not diaphoretic.     Comments: Well-appearing, comfortable, cooperative  HENT:     Head: Normocephalic and atraumatic.  Eyes:     General:        Right eye: No discharge.        Left eye: No discharge.     Conjunctiva/sclera: Conjunctivae normal.  Neck:     Thyroid: No thyromegaly.  Cardiovascular:     Rate and Rhythm: Normal rate and regular rhythm.     Pulses: Normal pulses.     Heart sounds: Normal heart sounds. No murmur heard. Pulmonary:     Effort: Pulmonary effort is normal. No respiratory distress.     Breath sounds: Wheezing present. No rales.     Comments: Noisy breathing some reduced air movement - similar to baseline unchanged Musculoskeletal:        General:  Normal range of motion.     Cervical back: Normal range of motion and neck supple.  Lymphadenopathy:     Cervical: No cervical adenopathy.  Skin:    General: Skin is warm and dry.     Findings: No erythema or rash.  Neurological:     Mental Status: He is alert and oriented to person, place, and time. Mental status is at baseline.  Psychiatric:        Behavior: Behavior normal.     Comments: Well groomed, good eye contact, normal speech and thoughts   Results for orders placed or performed in visit on 11/07/21  PSA  Result Value Ref Range   PSA 2.07 < OR = 4.00 ng/mL  Lipid panel  Result Value Ref Range   Cholesterol 213 (H) <200 mg/dL   HDL 39 (L) > OR = 40 mg/dL   Triglycerides 355 <974 mg/dL   LDL Cholesterol (Calc) 149 (H) mg/dL (calc)   Total CHOL/HDL Ratio 5.5 (H) <5.0 (calc)   Non-HDL Cholesterol (Calc) 174 (H) <130 mg/dL (calc)      Assessment & Plan:   Problem List Items Addressed This Visit     Pure hypercholesterolemia    Previously controlled cholesterol on statin and lifestyle Last lipid panel 10/2021 still elevated The 10-year ASCVD risk score (Arnett DK, et al., 2019) is: 37.2%  Plan: 1. Inc Statin to Rosuva 10mg  every OTHER day      Osteoarthritis of knees, bilateral    Chronic OA DJD bilateral knees Followed by Dr Ronalee Belts Future TKR scheduling for R Knee first      Obesity (BMI 30.0-34.9)   Essential hypertension - Primary    Controlled HTN. Mild elevated repeat check normal - Home BP readings improved avg  No known complications     Plan:  1. Continue current med - Losartan 50mg  daily 2. Encourage improved lifestyle - low sodium diet, regular exercise 3. Continue monitor BP outside office, bring readings to next visit, if persistently >140/90 or new symptoms notify office sooner      Centrilobular emphysema (HCC)    Chronic COPD emphysema chronic problem, with active tobacco abuse long history Declined maintenance therapy inhalers  previously - but he agrees to TRIAL one today on free sample Breztri given 1 week, he can contact [phone] back to order if interested  *NOTE - Update we are OUT OF STOCK on Breztri samples, we will check with rep and notify patient when back in  stock*  Counseling provided Follow yearly LDCT Lung Scan 12/2021      BPH with obstruction/lower urinary tract symptoms    BPH LUTS Improved PSA back to normal range F/u w Dr Apolinar JunesBrandon [facility]      Bilateral chronic knee pain   Aortic atherosclerosis (HCC)    Identified on last LDCT 2022 On Statin         No orders of the defined types were placed in this encounter.     Follow up plan: Return in about 9 months (around 08/15/2022) for 9 month fasting lab only then 1 week later Annual Physical.  Future labs ordered  Saralyn PilarAlexander Cason Dabney, DO [facility] 11/15/2021, 9:32 AM

## 2021-11-15 NOTE — Assessment & Plan Note (Signed)
Controlled HTN. Mild elevated repeat check normal - Home BP readings improved avg  No known complications     Plan:  1. Continue current med - Losartan 50mg daily 2. Encourage improved lifestyle - low sodium diet, regular exercise 3. Continue monitor BP outside office, bring readings to next visit, if persistently >140/90 or new symptoms notify office sooner 

## 2021-11-15 NOTE — Patient Instructions (Addendum)
Thank you for coming to the office today.  New updated Pneumonia vaccine PCV-20 it is the only Pneumonia shot you would need, no boosters on this one.  Switch to Rosuvastatin 10mg  cholesterol pill to EVERY OTHER DAY  Keep on other medication. Keep improving lifestyle.  PSA prostate improved to normal range.  DUE for FASTING BLOOD WORK (no food or drink after midnight before the lab appointment, only water or coffee without cream/sugar on the morning of)  SCHEDULE "Lab Only" visit in the morning at the clinic for lab draw in 9 MONTHS   - Make sure Lab Only appointment is at about 1 week before your next appointment, so that results will be available  For Lab Results, once available within 2-3 days of blood draw, you can can log in to MyChart online to view your results and a brief explanation. Also, we can discuss results at next follow-up visit.   Please schedule a Follow-up Appointment to: Return in about 9 months (around 08/15/2022) for 9 month fasting lab only then 1 week later Annual Physical.  If you have any other questions or concerns, please feel free to call the office or send a message through [facility]. You may also schedule an earlier appointment if necessary.  Additionally, you may be receiving a survey about your experience at our office within a few days to 1 week by e-mail or mail. We value your feedback.  Nobie Putnam, DO [place]

## 2021-11-15 NOTE — Assessment & Plan Note (Signed)
Identified on last LDCT 2022 On Statin 

## 2021-11-15 NOTE — Assessment & Plan Note (Signed)
Previously controlled cholesterol on statin and lifestyle Last lipid panel 10/2021 still elevated The 10-year ASCVD risk score (Arnett DK, et al., 2019) is: 37.2%  Plan: 1. Inc Statin to Rosuva 10mg  every OTHER day

## 2021-11-30 ENCOUNTER — Other Ambulatory Visit: Payer: Self-pay | Admitting: Surgery

## 2021-12-05 ENCOUNTER — Other Ambulatory Visit: Payer: Self-pay

## 2021-12-05 ENCOUNTER — Encounter
Admission: RE | Admit: 2021-12-05 | Discharge: 2021-12-05 | Disposition: A | Discharge status: Home / Self Care | Payer: Medicare Other | Source: Ambulatory Visit | Attending: Surgery | Admitting: Surgery

## 2021-12-05 VITALS — BP 162/93 | HR 111 | Resp 18 | Ht 76.0 in | Wt 246.0 lb

## 2021-12-05 DIAGNOSIS — Z01812 Encounter for preprocedural laboratory examination: Secondary | ICD-10-CM

## 2021-12-05 DIAGNOSIS — Z01818 Encounter for other preprocedural examination: Secondary | ICD-10-CM | POA: Diagnosis not present

## 2021-12-05 HISTORY — DX: Chronic obstructive pulmonary disease, unspecified: J44.9

## 2021-12-05 HISTORY — DX: Unspecified asthma, uncomplicated: J45.909

## 2021-12-05 HISTORY — DX: Prediabetes: R73.03

## 2021-12-05 HISTORY — DX: Unspecified osteoarthritis, unspecified site: M19.90

## 2021-12-05 HISTORY — DX: Dyspnea, unspecified: R06.00

## 2021-12-05 LAB — URINALYSIS, ROUTINE W REFLEX MICROSCOPIC
Bilirubin Urine: NEGATIVE
Glucose, UA: NEGATIVE mg/dL
Hgb urine dipstick: NEGATIVE
Ketones, ur: NEGATIVE mg/dL
Nitrite: NEGATIVE
Protein, ur: NEGATIVE mg/dL
Specific Gravity, Urine: 1.003 — ABNORMAL LOW (ref 1.005–1.030)
Squamous Epithelial / HPF: NONE SEEN (ref 0–5)
pH: 5 (ref 5.0–8.0)

## 2021-12-05 LAB — CBC WITH DIFFERENTIAL/PLATELET
Abs Immature Granulocytes: 0.04 10*3/uL (ref 0.00–0.07)
Basophils Absolute: 0.1 10*3/uL (ref 0.0–0.1)
Basophils Relative: 1 %
Eosinophils Absolute: 0.3 10*3/uL (ref 0.0–0.5)
Eosinophils Relative: 3 %
HCT: 43 % (ref 39.0–52.0)
Hemoglobin: 14 g/dL (ref 13.0–17.0)
Immature Granulocytes: 0 %
Lymphocytes Relative: 39 %
Lymphs Abs: 4.2 10*3/uL — ABNORMAL HIGH (ref 0.7–4.0)
MCH: 28.5 pg (ref 26.0–34.0)
MCHC: 32.6 g/dL (ref 30.0–36.0)
MCV: 87.6 fL (ref 80.0–100.0)
Monocytes Absolute: 0.8 10*3/uL (ref 0.1–1.0)
Monocytes Relative: 8 %
Neutro Abs: 5.4 10*3/uL (ref 1.7–7.7)
Neutrophils Relative %: 49 %
Platelets: 366 10*3/uL (ref 150–400)
RBC: 4.91 MIL/uL (ref 4.22–5.81)
RDW: 13.2 % (ref 11.5–15.5)
WBC: 10.8 10*3/uL — ABNORMAL HIGH (ref 4.0–10.5)
nRBC: 0 % (ref 0.0–0.2)

## 2021-12-05 LAB — COMPREHENSIVE METABOLIC PANEL
ALT: 32 U/L (ref 0–44)
AST: 27 U/L (ref 15–41)
Albumin: 4.4 g/dL (ref 3.5–5.0)
Alkaline Phosphatase: 65 U/L (ref 38–126)
Anion gap: 6 (ref 5–15)
BUN: 20 mg/dL (ref 8–23)
CO2: 28 mmol/L (ref 22–32)
Calcium: 9.4 mg/dL (ref 8.9–10.3)
Chloride: 104 mmol/L (ref 98–111)
Creatinine, Ser: 0.75 mg/dL (ref 0.61–1.24)
GFR, Estimated: >60 mL/min (ref >60)
Glucose, Bld: 102 mg/dL — ABNORMAL HIGH (ref 70–99)
Potassium: 4.1 mmol/L (ref 3.5–5.1)
Sodium: 138 mmol/L (ref 135–145)
Total Bilirubin: 0.4 mg/dL (ref 0.3–1.2)
Total Protein: 8.4 g/dL — ABNORMAL HIGH (ref 6.5–8.1)

## 2021-12-05 LAB — TYPE AND SCREEN
ABO/RH(D): A POS
Antibody Screen: NEGATIVE

## 2021-12-05 LAB — SURGICAL PCR SCREEN
MRSA, PCR: NEGATIVE
Staphylococcus aureus: NEGATIVE

## 2021-12-05 NOTE — Patient Instructions (Addendum)
Your procedure is scheduled on: 12/19/21 - Tuesday Report to the Registration Desk on the 1st floor of the [facility]. To find out your arrival time, please call [phone] between 1PM - 3PM on: 12/18/21 - Monday [facility] testing on 12/15/21 between 8 am - 12 noon.  REMEMBER: Instructions that are not followed completely may result in serious medical risk, up to and including death; or upon the discretion of your surgeon and anesthesiologist your surgery may need to be rescheduled.  Do not eat food after midnight the night before surgery.  No gum chewing, lozengers or hard candies.  You may however, drink CLEAR liquids up to 2 hours before you are scheduled to arrive for your surgery. Do not drink anything within 2 hours of your scheduled arrival time.  Clear liquids include: - water  - apple juice without pulp - gatorade (not RED, PURPLE, OR BLUE) - black coffee or tea (Do NOT add milk or creamers to the coffee or tea) Do NOT drink anything that is not on this list.  In addition, your doctor has ordered for you to drink the provided  Ensure Pre-Surgery Clear Carbohydrate Drink  Drinking this carbohydrate drink up to two hours before surgery helps to reduce insulin resistance and improve patient outcomes. Please complete drinking 2 hours prior to scheduled arrival time.  TAKE THESE MEDICATIONS THE MORNING OF SURGERY WITH A SIP OF WATER:  - fluticasone (FLONASE) 50 MCG/ACT nasal spray - ipratropium (ATROVENT) 0.06 % nasal spray  One week prior to surgery: Stop Anti-inflammatories (NSAIDS) such as Advil, Aleve, Ibuprofen, Motrin, Naproxen, Naprosyn and Aspirin based products such as Excedrin, Goodys Powder, BC Powder.  Stop ANY OVER THE COUNTER supplements until after surgery.  You may however, continue to take Tylenol if needed for pain up until the day of surgery.  No Alcohol for 24 hours before or after surgery.  No Smoking including  e-cigarettes for 24 hours prior to surgery.  No chewable tobacco products for at least 6 hours prior to surgery.  No nicotine patches on the day of surgery.  Do not use any "recreational" drugs for at least a week prior to your surgery.  Please be advised that the combination of cocaine and anesthesia may have negative outcomes, up to and including death. If you test positive for cocaine, your surgery will be cancelled.  On the morning of surgery brush your teeth with toothpaste and water, you may rinse your mouth with mouthwash if you wish. Do not swallow any toothpaste or mouthwash.  Use CHG Soap or wipes as directed on instruction sheet.  Do not wear jewelry, make-up, hairpins, clips or nail polish.  Do not wear lotions, powders, or perfumes.   Do not shave body from the neck down 48 hours prior to surgery just in case you cut yourself which could leave a site for infection.  Also, freshly shaved skin may become irritated if using the CHG soap.  Contact lenses, hearing aids and dentures may not be worn into surgery.  Do not bring valuables to the hospital. [facility] is not responsible for any missing/lost belongings or valuables.   Notify your doctor if there is any change in your medical condition (cold, fever, infection).  Wear comfortable clothing (specific to your surgery type) to the hospital.  After surgery, you can help prevent lung complications by doing breathing exercises.  Take deep breaths and cough every 1-2 hours. Your doctor may order a device  called an Facilities manager to help you take deep breaths. When coughing or sneezing, hold a pillow firmly against your incision with both hands. This is called splinting. Doing this helps protect your incision. It also decreases belly discomfort.  If you are being admitted to the hospital overnight, leave your suitcase in the car. After surgery it may be brought to your room.  If you are being discharged the day of  surgery, you will not be allowed to drive home. You will need a responsible adult (18 years or older) to drive you home and stay with you that night.   If you are taking public transportation, you will need to have a responsible adult (18 years or older) with you. Please confirm with your physician that it is acceptable to use public transportation.   Please call the Pre-admissions Testing Dept. at [phone] if you have any questions about these instructions.  Surgery Visitation Policy:  Patients undergoing a surgery or procedure may have one family member or support person with them as long as that person is not COVID-19 positive or experiencing its symptoms.  That person may remain in the waiting area during the procedure and may rotate out with other people.  Inpatient Visitation:    Visiting hours are 7 a.m. to 8 p.m. Up to two visitors ages 16+ are allowed at one time in a patient room. The visitors may rotate out with other people during the day. Visitors must check out when they leave, or other visitors will not be allowed. One designated support person may remain overnight. The visitor must pass COVID-19 screenings, use hand sanitizer when entering and exiting the patients room and wear a mask at all times, including in the patients room. Patients must also wear a mask when staff or their visitor are in the room. Masking is required regardless of vaccination status.

## 2021-12-12 ENCOUNTER — Other Ambulatory Visit: Payer: Self-pay | Admitting: Surgery

## 2021-12-15 ENCOUNTER — Other Ambulatory Visit
Admission: RE | Admit: 2021-12-15 | Discharge: 2021-12-15 | Disposition: A | Discharge status: Home / Self Care | Payer: Medicare Other | Source: Ambulatory Visit | Attending: Surgery | Admitting: Surgery

## 2021-12-15 DIAGNOSIS — Z20822 Contact with and (suspected) exposure to covid-19: Secondary | ICD-10-CM | POA: Diagnosis not present

## 2021-12-15 DIAGNOSIS — Z01812 Encounter for preprocedural laboratory examination: Secondary | ICD-10-CM | POA: Insufficient documentation

## 2021-12-15 LAB — SARS CORONAVIRUS 2 (TAT 6-24 HRS): SARS Coronavirus 2: NEGATIVE

## 2021-12-19 ENCOUNTER — Other Ambulatory Visit: Payer: Self-pay

## 2021-12-19 ENCOUNTER — Ambulatory Visit: Payer: Medicare Other

## 2021-12-19 ENCOUNTER — Ambulatory Visit: Payer: Medicare Other | Admitting: Urgent Care

## 2021-12-19 ENCOUNTER — Ambulatory Visit
Admission: RE | Admit: 2021-12-19 | Discharge: 2021-12-19 | Disposition: A | Discharge status: Home / Self Care | Payer: Medicare Other | Source: Ambulatory Visit | Attending: Surgery | Admitting: Surgery

## 2021-12-19 ENCOUNTER — Ambulatory Visit: Payer: Medicare Other | Admitting: Certified Registered"

## 2021-12-19 ENCOUNTER — Encounter: Payer: Self-pay | Admitting: Surgery

## 2021-12-19 ENCOUNTER — Encounter
Admission: RE | Disposition: A | Discharge status: Home / Self Care | Payer: Self-pay | Source: Ambulatory Visit | Attending: Surgery

## 2021-12-19 DIAGNOSIS — R591 Generalized enlarged lymph nodes: Secondary | ICD-10-CM | POA: Diagnosis not present

## 2021-12-19 DIAGNOSIS — Z471 Aftercare following joint replacement surgery: Secondary | ICD-10-CM | POA: Diagnosis not present

## 2021-12-19 DIAGNOSIS — R7303 Prediabetes: Secondary | ICD-10-CM | POA: Insufficient documentation

## 2021-12-19 DIAGNOSIS — M6281 Muscle weakness (generalized): Secondary | ICD-10-CM | POA: Diagnosis not present

## 2021-12-19 DIAGNOSIS — M1711 Unilateral primary osteoarthritis, right knee: Secondary | ICD-10-CM | POA: Insufficient documentation

## 2021-12-19 DIAGNOSIS — J449 Chronic obstructive pulmonary disease, unspecified: Secondary | ICD-10-CM | POA: Diagnosis not present

## 2021-12-19 DIAGNOSIS — I10 Essential (primary) hypertension: Secondary | ICD-10-CM | POA: Insufficient documentation

## 2021-12-19 DIAGNOSIS — Z96651 Presence of right artificial knee joint: Secondary | ICD-10-CM | POA: Diagnosis not present

## 2021-12-19 DIAGNOSIS — H6691 Otitis media, unspecified, right ear: Secondary | ICD-10-CM | POA: Diagnosis not present

## 2021-12-19 DIAGNOSIS — K644 Residual hemorrhoidal skin tags: Secondary | ICD-10-CM | POA: Diagnosis not present

## 2021-12-19 DIAGNOSIS — J432 Centrilobular emphysema: Secondary | ICD-10-CM | POA: Diagnosis not present

## 2021-12-19 DIAGNOSIS — I700 Atherosclerosis of aorta: Secondary | ICD-10-CM | POA: Diagnosis not present

## 2021-12-19 HISTORY — PX: TOTAL KNEE ARTHROPLASTY: SHX125

## 2021-12-19 LAB — ABO/RH: ABO/RH(D): A POS

## 2021-12-19 IMAGING — DX DG KNEE 1-2V PORT*R*
2 series · 2 of 2 positions shown · non-contrast
Comparison: [DATE]

CLINICAL DATA: Postop right knee replacement

EXAM:
PORTABLE RIGHT KNEE - 1-2 VIEW

[knee ap]
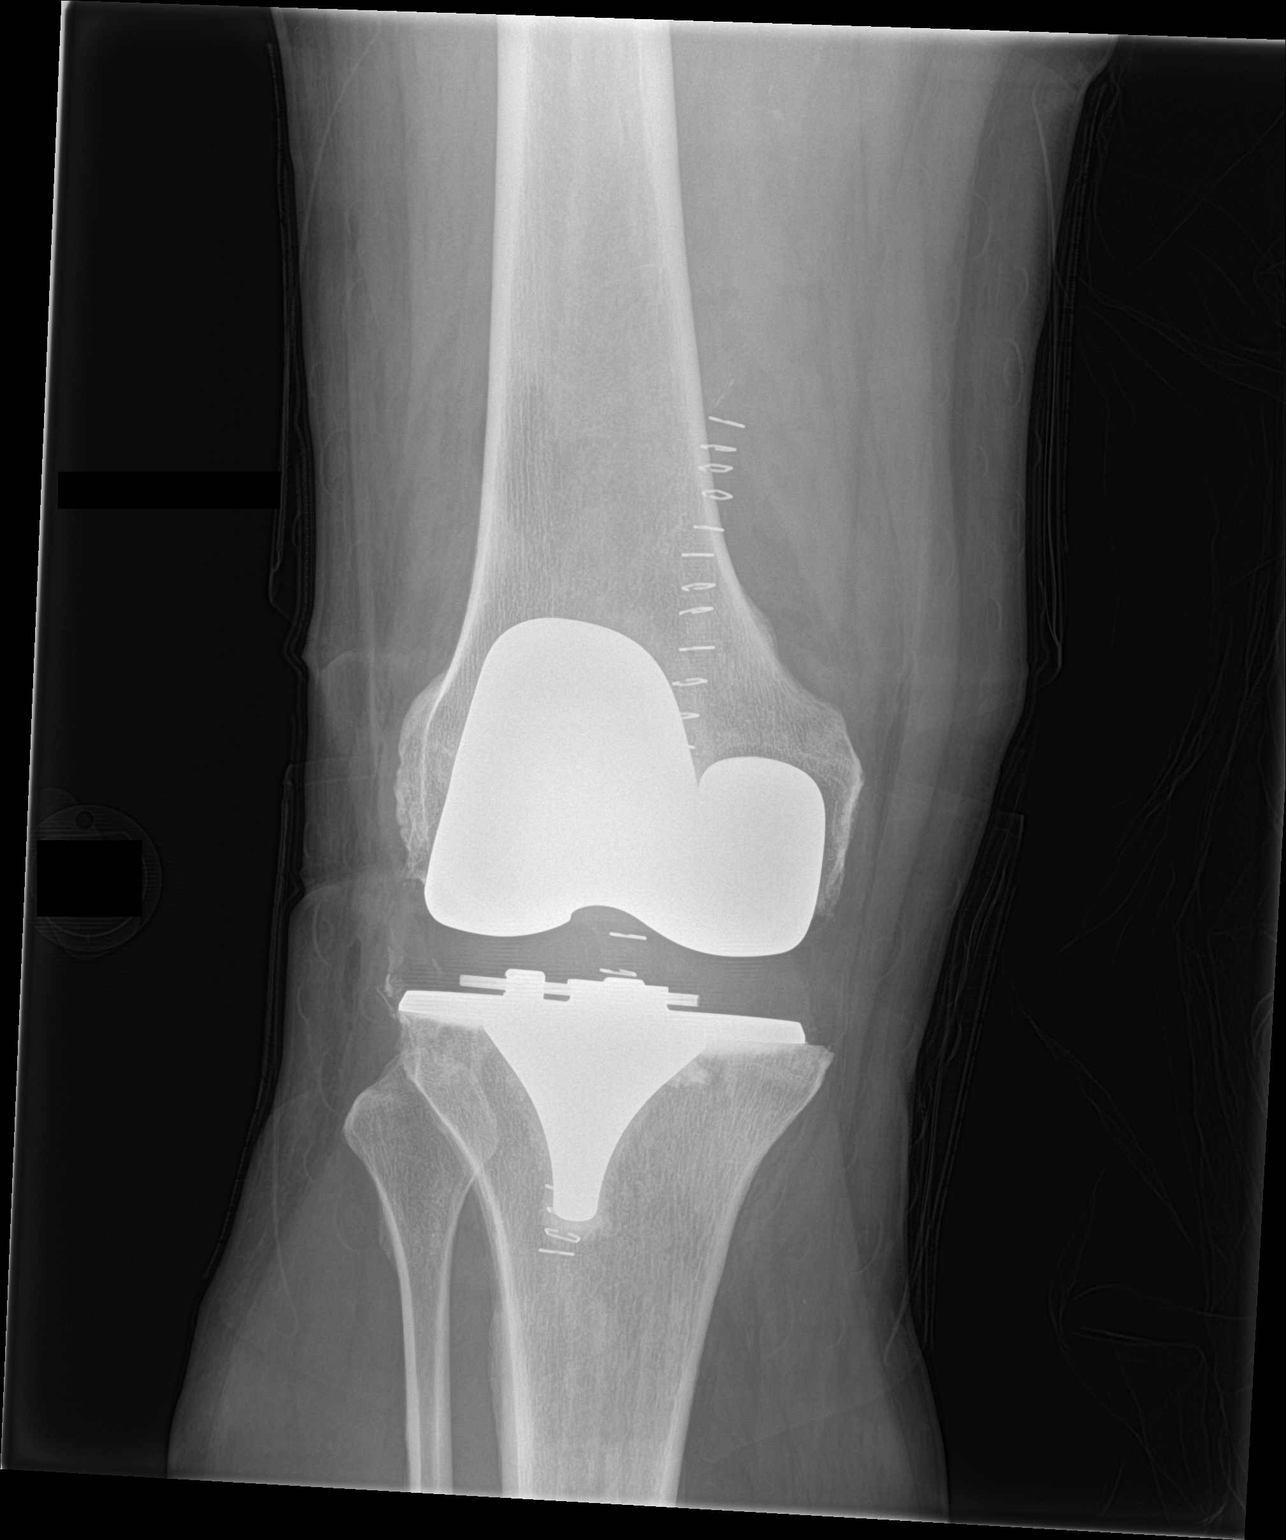

[knee lat]
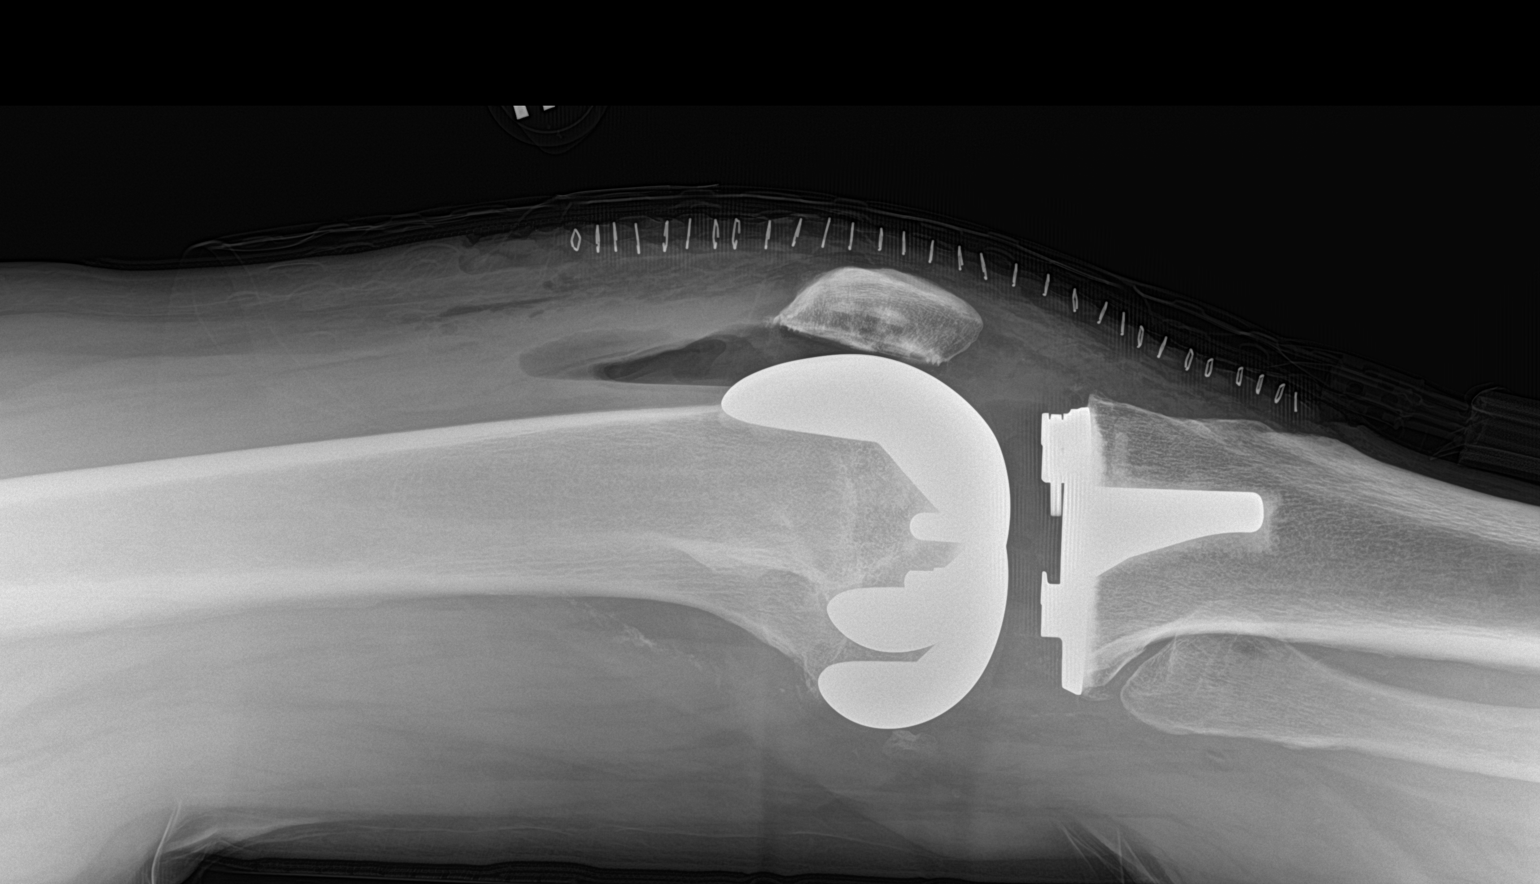

[2 of 2 positions shown; findings below may reference images not displayed]

FINDINGS: Postop right knee replacement. Prosthesis in satisfactory position
alignment. No fracture or complication. Gas and fluid in the knee
joint.
IMPRESSION: Satisfactory right knee replacement.

## 2021-12-19 SURGERY — ARTHROPLASTY, KNEE, TOTAL
Anesthesia: Spinal | Site: Knee | Laterality: Right

## 2021-12-19 MED ORDER — SODIUM CHLORIDE 0.9 % IV SOLN
INTRAVENOUS | Status: DC
  Administered 2021-12-19: 75 mL via INTRAVENOUS

## 2021-12-19 MED ORDER — TRANEXAMIC ACID 1000 MG/10ML IV SOLN
INTRAVENOUS | Status: AC
  Filled 2021-12-19: qty 10

## 2021-12-19 MED ORDER — SODIUM CHLORIDE 0.9 % IR SOLN
Status: DC | PRN
  Administered 2021-12-19: 3000 mL

## 2021-12-19 MED ORDER — APIXABAN 2.5 MG PO TABS: 2.5000 mg | ORAL_TABLET | Freq: Two times a day (BID) | ORAL | 0 refills | Status: DC

## 2021-12-19 MED ORDER — FAMOTIDINE 20 MG PO TABS
ORAL_TABLET | ORAL | Status: AC
  Administered 2021-12-19: 20 mg via ORAL
  Filled 2021-12-19: qty 1

## 2021-12-19 MED ORDER — KETAMINE HCL 10 MG/ML IJ SOLN
INTRAMUSCULAR | Status: DC | PRN
  Administered 2021-12-19 (×2): 10 mg via INTRAVENOUS

## 2021-12-19 MED ORDER — ACETAMINOPHEN 500 MG PO TABS
1000.0000 mg | ORAL_TABLET | Freq: Four times a day (QID) | ORAL | Status: DC
  Administered 2021-12-19: 1000 mg via ORAL

## 2021-12-19 MED ORDER — KETOROLAC TROMETHAMINE 15 MG/ML IJ SOLN: 7.5000 mg | Freq: Four times a day (QID) | INTRAMUSCULAR | Status: DC

## 2021-12-19 MED ORDER — FENTANYL CITRATE (PF) 100 MCG/2ML IJ SOLN: 25.0000 ug | INTRAMUSCULAR | Status: DC | PRN

## 2021-12-19 MED ORDER — DEXAMETHASONE SODIUM PHOSPHATE 10 MG/ML IJ SOLN
INTRAMUSCULAR | Status: DC | PRN
  Administered 2021-12-19: 10 mg via INTRAVENOUS

## 2021-12-19 MED ORDER — OXYCODONE HCL 5 MG PO TABS: 5.0000 mg | ORAL_TABLET | ORAL | Status: DC | PRN

## 2021-12-19 MED ORDER — TRANEXAMIC ACID 1000 MG/10ML IV SOLN
INTRAVENOUS | Status: DC | PRN
  Administered 2021-12-19: 1000 mg via TOPICAL

## 2021-12-19 MED ORDER — SODIUM CHLORIDE 0.9 % BOLUS PEDS
250.0000 mL | Freq: Once | INTRAVENOUS | Status: AC
Start: 2021-12-19 — End: 2021-12-19
  Administered 2021-12-19: 250 mL via INTRAVENOUS

## 2021-12-19 MED ORDER — 0.9 % SODIUM CHLORIDE (POUR BTL) OPTIME
TOPICAL | Status: DC | PRN
Start: 2021-12-19 — End: 2021-12-19
  Administered 2021-12-19: 500 mL

## 2021-12-19 MED ORDER — PROPOFOL 500 MG/50ML IV EMUL
INTRAVENOUS | Status: DC | PRN
  Administered 2021-12-19: 25 ug/kg/min via INTRAVENOUS

## 2021-12-19 MED ORDER — KETOROLAC TROMETHAMINE 15 MG/ML IJ SOLN
15.0000 mg | Freq: Once | INTRAMUSCULAR | Status: AC
  Administered 2021-12-19: 15 mg via INTRAVENOUS

## 2021-12-19 MED ORDER — LACTATED RINGERS IV SOLN: INTRAVENOUS | Status: DC

## 2021-12-19 MED ORDER — DEXMEDETOMIDINE HCL IN NACL 200 MCG/50ML IV SOLN
INTRAVENOUS | Status: DC | PRN
  Administered 2021-12-19: 8 ug via INTRAVENOUS
  Administered 2021-12-19 (×3): 4 ug via INTRAVENOUS

## 2021-12-19 MED ORDER — METOCLOPRAMIDE HCL 5 MG/ML IJ SOLN: 5.0000 mg | Freq: Three times a day (TID) | INTRAMUSCULAR | Status: DC | PRN

## 2021-12-19 MED ORDER — SODIUM CHLORIDE 0.9 % IV SOLN
INTRAVENOUS | Status: DC | PRN
  Administered 2021-12-19: 60 mL

## 2021-12-19 MED ORDER — CEFAZOLIN SODIUM-DEXTROSE 2-4 GM/100ML-% IV SOLN
INTRAVENOUS | Status: AC
  Filled 2021-12-19: qty 100

## 2021-12-19 MED ORDER — SODIUM CHLORIDE FLUSH 0.9 % IV SOLN
INTRAVENOUS | Status: AC
  Filled 2021-12-19: qty 40

## 2021-12-19 MED ORDER — BUPIVACAINE-EPINEPHRINE (PF) 0.5% -1:200000 IJ SOLN
INTRAMUSCULAR | Status: AC
  Filled 2021-12-19: qty 30

## 2021-12-19 MED ORDER — MIDAZOLAM HCL 5 MG/5ML IJ SOLN
INTRAMUSCULAR | Status: DC | PRN
Start: 2021-12-19 — End: 2021-12-19
  Administered 2021-12-19: .5 mg via INTRAVENOUS
  Administered 2021-12-19: 1 mg via INTRAVENOUS
  Administered 2021-12-19: .5 mg via INTRAVENOUS

## 2021-12-19 MED ORDER — SODIUM CHLORIDE 0.9 % BOLUS PEDS
250.0000 mL | Freq: Once | INTRAVENOUS | Status: AC
  Administered 2021-12-19: 250 mL via INTRAVENOUS

## 2021-12-19 MED ORDER — ACETAMINOPHEN 500 MG PO TABS
ORAL_TABLET | ORAL | Status: AC
  Filled 2021-12-19: qty 2

## 2021-12-19 MED ORDER — CEFAZOLIN SODIUM-DEXTROSE 2-4 GM/100ML-% IV SOLN
2.0000 g | INTRAVENOUS | Status: AC
  Administered 2021-12-19: 2 g via INTRAVENOUS

## 2021-12-19 MED ORDER — FAMOTIDINE 20 MG PO TABS: 20.0000 mg | ORAL_TABLET | Freq: Once | ORAL | Status: AC

## 2021-12-19 MED ORDER — LIDOCAINE HCL (CARDIAC) PF 100 MG/5ML IV SOSY
PREFILLED_SYRINGE | INTRAVENOUS | Status: DC | PRN
  Administered 2021-12-19: 100 mg via INTRAVENOUS

## 2021-12-19 MED ORDER — BUPIVACAINE LIPOSOME 1.3 % IJ SUSP
INTRAMUSCULAR | Status: AC
  Filled 2021-12-19: qty 20

## 2021-12-19 MED ORDER — ONDANSETRON HCL 4 MG/2ML IJ SOLN
INTRAMUSCULAR | Status: DC | PRN
  Administered 2021-12-19: 4 mg via INTRAVENOUS

## 2021-12-19 MED ORDER — METOCLOPRAMIDE HCL 10 MG PO TABS: 5.0000 mg | ORAL_TABLET | Freq: Three times a day (TID) | ORAL | Status: DC | PRN

## 2021-12-19 MED ORDER — ORAL CARE MOUTH RINSE: 15.0000 mL | Freq: Once | OROMUCOSAL | Status: AC

## 2021-12-19 MED ORDER — ONDANSETRON HCL 4 MG/2ML IJ SOLN: 4.0000 mg | Freq: Once | INTRAMUSCULAR | Status: DC | PRN

## 2021-12-19 MED ORDER — CHLORHEXIDINE GLUCONATE 0.12 % MT SOLN: 15.0000 mL | Freq: Once | OROMUCOSAL | Status: AC

## 2021-12-19 MED ORDER — OXYCODONE HCL 5 MG PO TABS: 5.0000 mg | ORAL_TABLET | ORAL | 0 refills | Status: DC | PRN

## 2021-12-19 MED ORDER — MIDAZOLAM HCL 2 MG/2ML IJ SOLN
INTRAMUSCULAR | Status: AC
Start: 2021-12-19 — End: ?
  Filled 2021-12-19: qty 2

## 2021-12-19 MED ORDER — PROPOFOL 1000 MG/100ML IV EMUL
INTRAVENOUS | Status: AC
  Filled 2021-12-19: qty 100

## 2021-12-19 MED ORDER — CEFAZOLIN SODIUM-DEXTROSE 2-4 GM/100ML-% IV SOLN: 2.0000 g | Freq: Four times a day (QID) | INTRAVENOUS | Status: DC

## 2021-12-19 MED ORDER — BUPIVACAINE-EPINEPHRINE (PF) 0.5% -1:200000 IJ SOLN
INTRAMUSCULAR | Status: DC | PRN
  Administered 2021-12-19: 30 mL via PERINEURAL

## 2021-12-19 MED ORDER — KETAMINE HCL 50 MG/5ML IJ SOSY
PREFILLED_SYRINGE | INTRAMUSCULAR | Status: AC
  Filled 2021-12-19: qty 5

## 2021-12-19 MED ORDER — ONDANSETRON HCL 4 MG/2ML IJ SOLN
4.0000 mg | Freq: Four times a day (QID) | INTRAMUSCULAR | Status: DC | PRN
Start: 2021-12-19 — End: 2021-12-19

## 2021-12-19 MED ORDER — KETOROLAC TROMETHAMINE 15 MG/ML IJ SOLN
INTRAMUSCULAR | Status: AC
  Filled 2021-12-19: qty 1

## 2021-12-19 MED ORDER — BUPIVACAINE HCL (PF) 0.5 % IJ SOLN
INTRAMUSCULAR | Status: DC | PRN
  Administered 2021-12-19: 2.5 mL

## 2021-12-19 MED ORDER — ONDANSETRON HCL 4 MG PO TABS
4.0000 mg | ORAL_TABLET | Freq: Four times a day (QID) | ORAL | Status: DC | PRN
Start: 2021-12-19 — End: 2021-12-19

## 2021-12-19 MED ORDER — PROPOFOL 10 MG/ML IV BOLUS
INTRAVENOUS | Status: AC
  Filled 2021-12-19: qty 20

## 2021-12-19 MED ORDER — CHLORHEXIDINE GLUCONATE 0.12 % MT SOLN
OROMUCOSAL | Status: AC
  Administered 2021-12-19: 15 mL via OROMUCOSAL
  Filled 2021-12-19: qty 15

## 2021-12-19 SURGICAL SUPPLY — 60 items
BEARING TIBIAL KNEE AS 16X83 (Knees) ×1 IMPLANT
BIT DRILL QUICK REL 1/8 2PK SL (DRILL) IMPLANT
BLADE SAW SAG 25X90X1.19 (BLADE) ×2 IMPLANT
BLADE SURG SZ20 CARB STEEL (BLADE) ×2 IMPLANT
BNDG ELASTIC 6X5.8 VLCR NS LF (GAUZE/BANDAGES/DRESSINGS) ×2 IMPLANT
CEMENT BONE R 1X40 (Cement) ×4 IMPLANT
CEMENT VACUUM MIXING SYSTEM (MISCELLANEOUS) ×2 IMPLANT
CHLORAPREP W/TINT 26 (MISCELLANEOUS) ×3 IMPLANT
COMP FEMORAL CRUC RIGHT 75MM (Joint) ×2 IMPLANT
COMPONENT FEMRL CRUC RT 75MM (Joint) IMPLANT
COOLER POLAR GLACIER W/PUMP (MISCELLANEOUS) ×2 IMPLANT
COVER MAYO STAND REUSABLE (DRAPES) ×2 IMPLANT
CUFF TOURN SGL QUICK 24 (TOURNIQUET CUFF)
CUFF TOURN SGL QUICK 34 (TOURNIQUET CUFF)
CUFF TRNQT CYL 24X4X16.5-23 (TOURNIQUET CUFF) IMPLANT
CUFF TRNQT CYL 34X4.125X (TOURNIQUET CUFF) IMPLANT
DRAPE 3/4 80X56 (DRAPES) ×2 IMPLANT
DRAPE IMP U-DRAPE 54X76 (DRAPES) ×2 IMPLANT
DRAPE U-SHAPE 47X51 STRL (DRAPES) ×2 IMPLANT
DRILL QUICK RELEASE 1/8 INCH (DRILL) ×9
DRSG MEPILEX SACRM 8.7X9.8 (GAUZE/BANDAGES/DRESSINGS) ×1 IMPLANT
DRSG OPSITE POSTOP 4X10 (GAUZE/BANDAGES/DRESSINGS) ×2 IMPLANT
DRSG OPSITE POSTOP 4X8 (GAUZE/BANDAGES/DRESSINGS) ×1 IMPLANT
ELECT REM PT RETURN 9FT ADLT (ELECTROSURGICAL) ×2
ELECTRODE REM PT RTRN 9FT ADLT (ELECTROSURGICAL) ×1 IMPLANT
GAUZE XEROFORM 1X8 LF (GAUZE/BANDAGES/DRESSINGS) ×2 IMPLANT
GLOVE SRG 8 PF TXTR STRL LF DI (GLOVE) ×1 IMPLANT
GLOVE SURG ENC MOIS LTX SZ7.5 (GLOVE) ×8 IMPLANT
GLOVE SURG ENC MOIS LTX SZ8 (GLOVE) ×8 IMPLANT
GLOVE SURG PR MICRO ENCORE 7.5 (GLOVE) ×2 IMPLANT
GLOVE SURG UNDER LTX SZ8 (GLOVE) ×2 IMPLANT
GLOVE SURG UNDER POLY LF SZ8 (GLOVE) ×2
GOWN STRL REUS W/ TWL LRG LVL3 (GOWN DISPOSABLE) ×1 IMPLANT
GOWN STRL REUS W/ TWL XL LVL3 (GOWN DISPOSABLE) ×1 IMPLANT
GOWN STRL REUS W/TWL LRG LVL3 (GOWN DISPOSABLE) ×4
GOWN STRL REUS W/TWL XL LVL3 (GOWN DISPOSABLE) ×1
IV NS IRRIG 3000ML ARTHROMATIC (IV SOLUTION) ×2 IMPLANT
KIT TURNOVER KIT A (KITS) ×2 IMPLANT
MANIFOLD NEPTUNE II (INSTRUMENTS) ×2 IMPLANT
NDL SPNL 20GX3.5 QUINCKE YW (NEEDLE) ×1 IMPLANT
NEEDLE SPNL 20GX3.5 QUINCKE YW (NEEDLE) ×2 IMPLANT
NS IRRIG 1000ML POUR BTL (IV SOLUTION) ×2 IMPLANT
PACK TOTAL KNEE (MISCELLANEOUS) ×2 IMPLANT
PAD WRAPON POLAR KNEE (MISCELLANEOUS) ×1 IMPLANT
PEG PATELLA SERIES A 37MMX10MM (Orthopedic Implant) ×1 IMPLANT
PLATE TIBIAL INTERLOK 83 (Plate) ×1 IMPLANT
PULSAVAC PLUS IRRIG FAN TIP (DISPOSABLE) ×2
STAPLER SKIN PROX 35W (STAPLE) ×2 IMPLANT
SUCTION FRAZIER HANDLE 10FR (MISCELLANEOUS) ×1
SUCTION TUBE FRAZIER 10FR DISP (MISCELLANEOUS) ×1 IMPLANT
SUT VIC AB 0 CT1 36 (SUTURE) ×7 IMPLANT
SUT VIC AB 2-0 CT1 27 (SUTURE) ×3
SUT VIC AB 2-0 CT1 TAPERPNT 27 (SUTURE) ×3 IMPLANT
SYR 10ML LL (SYRINGE) ×2 IMPLANT
SYR 20ML LL LF (SYRINGE) ×2 IMPLANT
SYR 30ML LL (SYRINGE) IMPLANT
TIP FAN IRRIG PULSAVAC PLUS (DISPOSABLE) ×1 IMPLANT
TRAP FLUID SMOKE EVACUATOR (MISCELLANEOUS) ×2 IMPLANT
WATER STERILE IRR 500ML POUR (IV SOLUTION) ×2 IMPLANT
WRAPON POLAR PAD KNEE (MISCELLANEOUS) ×2

## 2021-12-19 NOTE — OR Nursing (Signed)
Heart rate 130-126.  No acute distress noted.  Bladder scan revealed 801 urine in bladder.  Stood at bedside and urinated 825cc.  Heart rate dropped to 110-100 after voiding.

## 2021-12-19 NOTE — Discharge Instructions (Addendum)
Orthopedic discharge instructions: May shower with intact OpSite dressing. Apply ice frequently to knee or use Polar Care device. Start Eliquis 2.5 mg twice daily for 2 weeks on Wednesday, 12/20/2021, then take aspirin 325 mg daily for 4 weeks. Take pain medication as prescribed or ES Tylenol when needed.  May weight-bear as tolerated - use walker for balance and support. Follow-up in 10-14 days or as scheduled.  POLAR CARE INFORMATION  MassAdvertisement.it  How to use Breg Polar Care [facility] Therapy System?   YouTube   ShippingScam.co.uk  OPERATING INSTRUCTIONS  Start the product With dry hands, connect the transformer to the electrical connection located on the top of the cooler. Next, plug the transformer into an appropriate electrical outlet. The unit will automatically start running at this point.  To stop the pump, disconnect electrical power.  Unplug to stop the product when not in use. Unplugging the Polar Care unit turns it off. Always unplug immediately after use. Never leave it plugged in while unattended. Remove pad.    FIRST ADD WATER TO FILL LINE, THEN ICE---Replace ice when existing ice is almost melted  1 Discuss Treatment with your Licensed Health Care Practitioner and Use Only as Prescribed 2 Apply Insulation Barrier & Cold Therapy Pad 3 Check for Moisture 4 Inspect Skin Regularly  Tips and Trouble Shooting Usage Tips 1. Use cubed or chunked ice for optimal performance. 2. It is recommended to drain the Pad between uses. To drain the pad, hold the Pad upright with the hose pointed toward the ground. Depress the black plunger and allow water to drain out. 3. You may disconnect the Pad from the unit without removing the pad from the affected area by depressing the silver tabs on the hose coupling and gently pulling the hoses apart. The Pad and unit will seal itself and will not leak. Note: Some dripping during release is normal. 4. DO NOT RUN PUMP  WITHOUT WATER! The pump in this unit is designed to run with water. Running the unit without water will cause permanent damage to the pump. 5. Unplug unit before removing lid.  TROUBLESHOOTING GUIDE Pump not running, Water not flowing to the pad, Pad is not getting cold 1. Make sure the transformer is plugged into the wall outlet. 2. Confirm that the ice and water are filled to the indicated levels. 3. Make sure there are no kinks in the pad. 4. Gently pull on the blue tube to make sure the tube/pad junction is straight. 5. Remove the pad from the treatment site and ll it while the pad is lying at; then reapply. 6. Confirm that the pad couplings are securely attached to the unit. Listen for the double clicks (Figure 1) to confirm the pad couplings are securely attached.  Leaks    Note: Some condensation on the lines, controller, and pads is unavoidable, especially in warmer climates. 1. If using a Breg Polar Care Cold Therapy unit with a detachable Cold Therapy Pad, and a leak exists (other than condensation on the lines) disconnect the pad couplings. Make sure the silver tabs on the couplings are depressed before reconnecting the pad to the pump hose; then confirm both sides of the coupling are properly clicked in. 2. If the coupling continues to leak or a leak is detected in the pad itself, stop using it and call Breg Customer Care at [phone].  Cleaning After use, empty and dry the unit with a soft cloth. Warm water and mild detergent may be used  occasionally to clean the pump and tubes.  WARNING: The Polar Care Cube can be cold enough to cause serious injury, including full skin necrosis. Follow these Operating Instructions, and carefully read the Product Insert (see pouch on side of unit) and the Cold Therapy Pad Fitting Instructions (provided with each Cold Therapy Pad) prior to use.          Femoral Nerve Block Discharge Instructions  Keep green arm band on for 4 days  1.  For  your surgery you have received a femoral Nerve Block.  2.  Your Nerve Block is expected to last for about 4 to 12 hours. This is an estimated        time frame; the results of your nerve block may wear off sooner or may last longer.  3.  If needed, your surgeon will give you a prescription for pain medication. It will take     about 60 minutes for the oral pain medication to become fully effective. So, it is     recommended that you start taking this medication before the nerve block first begins     to wear off, or when you first begin to feel discomfort.  4.  Keep in mind that nerve blocks often wear off in the middle of the night. If you are      going to bed and the block has not started to wear off or you have not started to have      any discomfort, consider setting an alarm for 2 to 3 hours, so you can assess your      block. If you notice the block is wearing off or you are starting to have discomfort,           you can take your pain medication.  5. Take your pain medication only as prescribed. Pain medication can cause sedation          and decrease your breathing if you take more than you need for the level of pain that        you have.  6.  Nausea is a common side effect of many pain medications. You may want to eat             something before taking your pain medicine to prevent nausea.  7.  After a Femoral nerve block, you cannot feel pain, pressure or extremes in                   temperature in the effected leg. Because your leg is numb it is at an increased risk for     injury. To decrease the possibility of injury, please practice the following:   a. While you are awake change the position of your leg frequently to prevent too      much pressure on any one area for prolonged periods of time.   b. If you have a cast or tight dressing, check the color or your toes every couple                of hours. Call your surgeon with the appearance of any discoloration (white or                 blue).   c. You may have difficulty bearing weight on the effected leg. Have someone                assist you with walking until the nerve block has completely  worn off.   d. If you surgeon prescribed a brace to be worn after surgery, DO NOT GET UP                AT NIGHT WITHOUT YOUR BRACE.              e. If your surgeon has restricted the amount of weight you should bear on the                      effected leg, i.e. No Weight, Partial Weight, or Touch Down Only, DO NOT                       BEAR MORE WEIGHT THAN INSTRUCTED.             f. If you experience any problems or concerns, please contact your surgeons                     office.     AMBULATORY SURGERY  DISCHARGE INSTRUCTIONS   The drugs that you were given will stay in your system until tomorrow so for the next 24 hours you should not:  Drive an automobile Make any legal decisions Drink any alcoholic beverage   You may resume regular meals tomorrow.  Today it is better to start with liquids and gradually work up to solid foods.  You may eat anything you prefer, but it is better to start with liquids, then soup and crackers, and gradually work up to solid foods.   Please notify your doctor immediately if you have any unusual bleeding, trouble breathing, redness and pain at the surgery site, drainage, fever, or pain not relieved by medication.    Additional Instructions: May take ibuprofen at ( up to 800mg  ) every 8 hours first dose at 4:45. Alternating with tylenol 1000mg  every 8 hours.  ( One or the other ever 4 hrs)     Please contact your physician with any problems or Same Day Surgery at [phone], Monday through Friday 6 am to 4 pm, or [place] at [facility] number at [phone].

## 2021-12-19 NOTE — Anesthesia Procedure Notes (Signed)
Spinal ° °Patient location during procedure: OR °Start time: 12/19/2021 7:38 AM °End time: 12/19/2021 7:39 AM °Reason for block: surgical anesthesia °Staffing °Performed: resident/CRNA  °Anesthesiologist: Karenz, Andrew, MD °Resident/CRNA: Starr, Deana, CRNA °Preanesthetic Checklist °Completed: patient identified, IV checked, site marked, risks and benefits discussed, surgical consent, monitors and equipment checked, pre-op evaluation and timeout performed °Spinal Block °Patient position: sitting °Prep: ChloraPrep °Patient monitoring: heart rate, continuous pulse ox, blood pressure and cardiac monitor °Approach: midline °Location: L3-4 °Injection technique: single-shot °Needle °Needle type: Whitacre and Introducer  °Needle gauge: 24 G °Needle length: 9 cm °Assessment °Events: CSF return °Additional Notes °Negative paresthesia. Negative blood return. Positive free-flowing CSF. Expiration date of kit checked and confirmed. Patient tolerated procedure well, without complications. ° ° ° ° ° °

## 2021-12-19 NOTE — TOC Progression Note (Signed)
Transition of Care [facility]) - Progression Note    Patient Details  Name: Jesse Gardner MRN: [id number] Date of Birth: [birth year]  Transition of Care [facility]) CM/SW Contact  Marlowe Sax, RN Phone Number: 12/19/2021, 12:20 PM  Clinical Narrative:    Patient is set up for [facility] thru [facility], RW and 3 in 1 to be delivered to the bedside by adapt        Expected Discharge Plan and Services           Expected Discharge Date: 12/19/21                                     Social Determinants of Health (SDOH) Interventions    Readmission Risk Interventions No flowsheet data found.

## 2021-12-19 NOTE — Evaluation (Addendum)
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 History of shortness of breath with exertion, COPD, current smoker, diabetes, arthritis, HTN.   Clinical Impression  Patient is agreeable to PT. He reports he was independent with mobility prior to surgery without assistive device. He reports 3.5/10 pain in right knee with activity. Patient was able to ambulate in hallway with improving gait pattern with increased ambulation distance using rolling walker. Patient completed stair training also. Issued home exercise packet and patient educated on positioning of right knee to promote extension and flexion. Patient will have assistance from daughter at home as needed and DME has been delivered to the room. Recommend HHPT at discharge and ongoing PT to maximize independence and facilitate return to prior level of function. PT will follow while in the hospital, however anticipate discharge home today.      Recommendations for follow up therapy are one component of a multi-disciplinary discharge planning process, led by the attending physician.  Recommendations may be updated based on patient status, additional functional criteria and insurance authorization.  Follow Up Recommendations Home health PT    Assistance Recommended at Discharge PRN  Patient can return home with the following  Assist for transportation;Help with stairs or ramp for entrance    Equipment Recommendations Rolling walker (2 wheels);BSC/3in1 (DME has been delivered in the room)  Recommendations for Other Services       Functional Status Assessment Patient has had a recent decline in their functional status and demonstrates the ability to make significant improvements in function in a reasonable and predictable amount of time.     Precautions /  Restrictions Precautions Precautions: Fall;Knee Precaution Booklet Issued: Yes (comment) Restrictions Weight Bearing Restrictions: Yes RLE Weight Bearing: Weight bearing as tolerated      Mobility  Bed Mobility Overal bed mobility: Modified Independent                  Transfers Overall transfer level: Needs assistance Equipment used: Rolling walker (2 wheels) Transfers: Sit to/from Stand Sit to Stand: Min guard           General transfer comment: with initial instruction, patient demonstrated correct technique without difficulty    Ambulation/Gait Ambulation/Gait assistance: Min guard, Supervision Gait Distance (Feet): 120 Feet Assistive device: Rolling walker (2 wheels) Gait Pattern/deviations: Decreased stance time - right, Decreased stride length Gait velocity: decreased     General Gait Details: demonstration provided for proper use of rolling walker. step to initially with improving gait pattern with increased ambulation distance.  Stairs Stairs: Yes Stairs assistance: Min guard Stair Management: Two rails, Step to pattern Number of Stairs: 4 General stair comments: patient able to go up and down 4 steps using correct technique with initial instruction.  Wheelchair Mobility    Modified Rankin (Stroke Patients Only)       Balance Overall balance assessment: Needs assistance Sitting-balance support: Feet supported Sitting balance-Leahy Scale: Good     Standing balance support: Bilateral upper extremity supported Standing balance-Leahy Scale: Fair Standing balance comment: patient able to maintain standing balance without difficulty                             Pertinent Vitals/Pain Pain Assessment Pain Assessment: 0-10 Pain Score:  (3.5) Pain Location: right knee Pain Descriptors /  Indicators: Sore Pain Intervention(s): Limited activity within patient's tolerance, Monitored during session (encouraged use of polar care)     Home Living Family/patient expects to be discharged to:: Private residence Living Arrangements: Alone Available Help at Discharge: Family (daughter is off work for 2 weeks) Type of Home: House Home Access: Stairs to enter Entrance Stairs-Rails: Horticulturist, commercial of Steps: 4   Home Layout: One level Home Equipment: None      Prior Function Prior Level of Function : Independent/Modified Independent             Mobility Comments: independent ADLs Comments: independent     Hand Dominance        Extremity/Trunk Assessment   Upper Extremity Assessment Upper Extremity Assessment: Overall WFL for tasks assessed    Lower Extremity Assessment Lower Extremity Assessment: LLE deficits/detail;RLE deficits/detail RLE Deficits / Details: patient able to perform SLR x 5 reps independently. no knee buckling with weight bearing. knee ROM 8-74 degrees RLE Sensation: WNL LLE Deficits / Details: WFL       Communication   Communication: No difficulties  Cognition Arousal/Alertness: Awake/alert Behavior During Therapy: WFL for tasks assessed/performed Overall Cognitive Status: Within Functional Limits for tasks assessed                                 General Comments: patient is agreeable to PT. patient is able to follow commands without difficulty        General Comments      Exercises Total Joint Exercises Ankle Circles/Pumps: AROM, Strengthening, 5 reps, Supine, Both Quad Sets: AROM, Strengthening, 10 reps, Supine, Right Straight Leg Raises: AROM, Strengthening, Right, 5 reps, Supine Goniometric ROM: right knee 8-74 degrees Other Exercises Other Exercises: verbal cues for exercise technique. patient provided with home exercise program   Assessment/Plan    PT Assessment Patient needs continued PT services  PT Problem List Decreased strength;Decreased range of motion;Decreased activity tolerance;Decreased balance;Decreased mobility;Decreased  knowledge of use of DME       PT Treatment Interventions DME instruction;Gait training;Stair training;Functional mobility training;Therapeutic exercise;Therapeutic activities;Balance training;Neuromuscular re-education;Patient/family education;Cognitive remediation    PT Goals (Current goals can be found in the Care Plan section)  Acute Rehab PT Goals Patient Stated Goal: to go home today PT Goal Formulation: With patient Time For Goal Achievement: 01/02/22 Potential to Achieve Goals: Good    Frequency BID     Co-evaluation               AM-PAC PT "6 Clicks" Mobility  Outcome Measure Help needed turning from your back to your side while in a flat bed without using bedrails?: None Help needed moving from lying on your back to sitting on the side of a flat bed without using bedrails?: A Little Help needed moving to and from a bed to a chair (including a wheelchair)?: A Little Help needed standing up from a chair using your arms (e.g., wheelchair or bedside chair)?: A Little Help needed to walk in hospital room?: A Little Help needed climbing 3-5 steps with a railing? : A Little 6 Click Score: 19    End of Session Equipment Utilized During Treatment: Gait belt Activity Tolerance: Patient tolerated treatment well Patient left: in chair;with nursing/sitter in room;with family/visitor present (nurse and daughter in the room) Nurse Communication: Mobility status PT Visit Diagnosis: Muscle weakness (generalized) (M62.81);Other abnormalities of gait and mobility (R26.89)    Time: 1355-1430 PT Time Calculation (  min) (ACUTE ONLY): 35 min   Charges:   PT Evaluation $PT Eval Low Complexity: 1 Low PT Treatments $Gait Training: 8-22 mins $Therapeutic Exercise: 8-22 mins       Minna Merritts, PT, MPT   Percell Locus 12/19/2021, 3:00 PM

## 2021-12-19 NOTE — H&P (Signed)
History of Present Illness: Jesse Gardner is a 76 y.o. male who presents today for his surgical history and physical for upcoming right total knee arthroplasty. Surgery scheduled with Dr. Joice Lofts on 12/19/2021. The patient denies any personal history of heart attack, stroke, asthma or COPD. No personal history of blood clots. The patient reports a 5 out of 10 pain score in the right knee at today's visit. The patient denies any surgical history to the right knee in the past. The patient has undergone x-rays of the right knee which demonstrate severe degenerative changes primarily involving the compartment of the knee.  Past Medical History: History reviewed. No pertinent past medical history.  Past Surgical History: History reviewed. No pertinent surgical history.  Past Family History: History reviewed. No pertinent family history.  Medications:  fluticasone propionate (FLONASE) 50 mcg/actuation nasal spray USE 2 SPRAYS IN EACH NOSTRIL DAILY. USE FOR 4 TO 6 WEEKS, THEN STOP AND USE SEASONALLY OR AS NEEDED   glucosamine/chondr su A sod (OSTEO BI-FLEX ORAL) Take by mouth once daily   losartan (COZAAR) 50 MG tablet Take 1 tablet (50 mg total) by mouth once daily   melatonin 5 mg Cap Take by mouth Take 5 mg by mouth at bedtime as needed (sleep).   naproxen sodium (ALEVE) 220 MG tablet Take 1 tablet (220 mg total) by mouth once daily   Allergies: No Known Allergies   Review of Systems:  A comprehensive 14 point ROS was performed, reviewed by me today, and the pertinent orthopaedic findings are documented in the HPI.  Physical Exam: BP (!) 142/82   Ht 193 cm (6\' 4" )   Wt (!) 111.6 kg (246 lb)   BMI 29.94 kg/m  General/Constitutional: The patient appears to be well-nourished, well-developed, and in no acute distress. Neuro/Psych: Normal mood and affect, oriented to person, place and time. Eyes: Non-icteric. Pupils are equal, round, and reactive to light, and exhibit synchronous movement. ENT:  Unremarkable. Lymphatic: No palpable adenopathy. Respiratory: Lungs clear to auscultation, Normal chest excursion, No wheezes and Non-labored breathing Cardiovascular: Regular rate and rhythm. No murmurs. and No edema, swelling or tenderness, except as noted in detailed exam. Integumentary: No impressive skin lesions present, except as noted in detailed exam. Musculoskeletal: Unremarkable, except as noted in detailed exam.  Right knee exam: GAIT: mild limp and uses no assistive devices. ALIGNMENT: moderate varus SKIN: unremarkable SWELLING: mild EFFUSION: small WARMTH: no warmth TENDERNESS: moderate over the medial joint line, minimal tenderness along the lateral joint line ROM: 5 to 120 degrees with mild pain in maximal flexion McMURRAY'S: equivocal PATELLOFEMORAL: normal tracking with no peri-patellar tenderness and negative apprehension sign CREPITUS: Mild patellofemoral crepitance LACHMAN'S: negative PIVOT SHIFT: negative ANTERIOR DRAWER: negative POSTERIOR DRAWER: negative VARUS/VALGUS: Mildly positive pseudolaxity to varus stressing   He is neurovascularly intact to the right lower extremity and foot.  Imaging: None.  Impression: Primary osteoarthritis of right knee.  Plan:  1. Treatment options were discussed today with the patient. 2. The patient is scheduled for a right total knee arthroplasty with Dr. on 12/19/2021. 3. The patient was instructed on the risk and benefits of surgery and wishes to proceed at this time. 4. This document will serve as a surgical history and physical for the patient. 5. The patient will follow-up per standard postop protocol. They can call the clinic they have any questions, new symptoms develop or symptoms worsen.  The procedure was discussed with the patient, as were the potential risks (including bleeding, infection, nerve and/or  blood vessel injury, persistent or recurrent pain, failure of the hardware, periprosthetic fracture,  need for further surgery, blood clots, strokes, heart attacks and/or arhythmias, pneumonia, etc.) and benefits. The patient states his understanding and wishes to proceed.   H&P reviewed and patient re-examined. No changes.

## 2021-12-19 NOTE — Anesthesia Preprocedure Evaluation (Signed)
Anesthesia Evaluation  Patient identified by MRN, date of birth, ID band Patient awake    Reviewed: Allergy & Precautions, H&P , NPO status , Patient's Chart, lab work & pertinent test results, reviewed documented beta blocker date and time   History of Anesthesia Complications Negative for: history of anesthetic complications  Airway Mallampati: II  TM Distance: >3 FB Neck ROM: full    Dental  (+) Dental Advidsory Given, Poor Dentition, Missing, Edentulous Upper   Pulmonary shortness of breath and with exertion, asthma (as a child) , COPD,  COPD inhaler, neg recent URI, Current Smoker and Patient abstained from smoking.,    Pulmonary exam normal breath sounds clear to auscultation       Cardiovascular Exercise Tolerance: Good hypertension, (-) angina(-) Past MI and (-) Cardiac Stents Normal cardiovascular exam(-) dysrhythmias (-) Valvular Problems/Murmurs Rhythm:regular Rate:Normal     Neuro/Psych negative neurological ROS  negative psych ROS   GI/Hepatic negative GI ROS, Neg liver ROS,   Endo/Other  diabetes (borderline)  Renal/GU negative Renal ROS  negative genitourinary   Musculoskeletal   Abdominal   Peds  Hematology negative hematology ROS (+)   Anesthesia Other Findings Past Medical History: No date: Arthritis No date: Asthma     Comment:  patient reports "as a child" No date: COPD (chronic obstructive pulmonary disease) (HCC) No date: Dyspnea No date: Hypertension No date: Pre-diabetes   Reproductive/Obstetrics negative OB ROS                             Anesthesia Physical Anesthesia Plan  ASA: 2  Anesthesia Plan: Spinal   Post-op Pain Management:    Induction:   PONV Risk Score and Plan: 0 and Propofol infusion and TIVA  Airway Management Planned: Natural Airway and Simple Face Mask  Additional Equipment:   Intra-op Plan:   Post-operative Plan:   Informed  Consent: I have reviewed the patients History and Physical, chart, labs and discussed the procedure including the risks, benefits and alternatives for the proposed anesthesia with the patient or authorized representative who has indicated his/her understanding and acceptance.     Dental Advisory Given  Plan Discussed with: Anesthesiologist, CRNA and Surgeon  Anesthesia Plan Comments:         Anesthesia Quick Evaluation

## 2021-12-19 NOTE — Op Note (Signed)
12/19/2021  10:08 AM  Patient:   Jesse Gardner  Pre-Op Diagnosis:   Degenerative joint disease, right knee.  Post-Op Diagnosis:   Same  Procedure:   Right TKA using all-cemented Bi[employer] Vanguard system with a 75 mm PCR femur, an  83  mm tibial tray with a 16 mm anterior stabilized E-poly insert, and a 37 x 10 mm all-poly 3-pegged domed patella.  Surgeon:   Maryagnes Amos, MD  Assistant:   Horris Latino, PA-C; Julious Payer, PA-S  Anesthesia:   Spinal  Findings:   As above  Complications:   None  EBL:   20 cc  Fluids:   1200 cc crystalloid  UOP:   None  TT:   110 minutes at 300 mmHg  Drains:   None  Closure:   Staples  Implants:   As above  Brief Clinical Note:   The patient is a 76 year old male with a long history of progressively worsening right knee pain. The patient's symptoms have progressed despite medications, activity modification, injections, etc. The patient's history and examination were consistent with advanced degenerative joint disease of the right knee confirmed by plain radiographs. The patient presents at this time for a right total knee arthroplasty.  Procedure:   The patient was brought into the operating room. After adequate spinal anesthesia was obtained, the patient was lain in the supine position before the right lower extremity was prepped with ChloraPrep solution and draped sterilely. Preoperative antibiotics were administered. After verifying the proper laterality with a surgical timeout, the limb was exsanguinated with an Esmarch and the tourniquet inflated to 300 mmHg.   A standard anterior approach to the knee was made through an approximately 7 inch incision. The incision was carried down through the subcutaneous tissues to expose superficial retinaculum. This was split the length of the incision and the medial flap elevated sufficiently to expose the medial retinaculum. The medial retinaculum was incised, leaving a 3-4 mm cuff of tissue on the  patella. This was extended distally along the medial border of the patellar tendon and proximally through the medial third of the quadriceps tendon. A subtotal fat pad excision was performed before the soft tissues were elevated off the anteromedial and anterolateral aspects of the proximal tibia to the level of the collateral ligaments. The anterior portions of the medial and lateral menisci were removed, as was the anterior cruciate ligament. With the knee flexed to 90, the external tibial guide was positioned and the appropriate proximal tibial cut made. This piece was taken to the back table where it was measured and found to be optimally replicated by an  83  mm component.  Attention was directed to the distal femur. The intramedullary canal was accessed through a 3/8" drill hole. The intramedullary guide was inserted and positioned in order to obtain a neutral flexion gap. The intercondylar block was positioned with care taken to avoid notching the anterior cortex of the femur. The appropriate cut was made. Next, the distal cutting block was placed at 5 of valgus alignment. Using the 9 mm slot, the distal cut was made. The distal femur was measured and found to be optimally replicated by the 75 mm component. The 75 mm 4-in-1 cutting block was positioned and first the posterior, then the posterior chamfer, the anterior chamfer, and finally the anterior cuts were made. At this point, the posterior portions medial and lateral menisci were removed. A trial reduction was performed using the appropriate femoral and tibial components with first the  10 mm, then the 12 mm, the 14 mm, and finally the 16 mm insert. The 16 mm insert demonstrated excellent stability to varus and valgus stressing both in flexion and extension while permitting full extension. Patella tracking was assessed and found to be excellent. Therefore, the tibial guide position was marked on the proximal tibia. The patella thickness was measured  and found to be 29 mm. Therefore, the appropriate cut was made. The patellar surface was measured and found to be optimally replicated by the 37 mm component. The three peg holes were drilled in place before the trial button was inserted. Patella tracking was assessed and found to be excellent, passing the "no thumb test". The lug holes were drilled into the distal femur before the trial component was removed, leaving only the tibial tray. The keel was then created using the appropriate tower, reamer, and punch.  The bony surfaces were prepared for cementing by irrigating them thoroughly with sterile saline solution via the jet lavage system. A bone plug was fashioned from some of the bone that had been removed previously and used to plug the distal femoral canal. In addition, 20 cc of Exparel diluted out to 60 cc with normal saline and 30 cc of 0.5% Sensorcaine were injected into the postero-medial and postero-lateral aspects of the knee, the medial and lateral gutter regions, and the peri-incisional tissues to help with postoperative analgesia. Meanwhile, the cement was being mixed on the back table. When it was ready, the tibial tray was cemented in first. The excess cement was removed using Personal assistant. Next, the femoral component was impacted into place. Again, the excess cement was removed using Personal assistant. The 16 mm trial insert was positioned and the knee brought into extension while the cement hardened. Finally, the patella was cemented into place and secured using the patellar clamp. Again, the excess cement was removed using Personal assistant. Once the cement had hardened, the knee was placed through a range of motion with the findings as described above. Therefore, the trial insert was removed and, after verifying that no cement had been retained posteriorly, the permanent 16 mm anterior stabilized E-polyethylene insert was positioned and secured using the appropriate key locking mechanism.  Again the knee was placed through a range of motion with the findings as described above.  The wound was copiously irrigated with sterile saline solution using the jet lavage system before the quadriceps tendon and retinacular layer were reapproximated using #0 Vicryl interrupted sutures. The superficial retinacular layer also was closed using a running #0 Vicryl suture. A total of 10 cc of transexemic acid (TXA) was injected intra-articularly before the subcutaneous tissues were closed in several layers using 2-0 Vicryl interrupted sutures. The skin was closed using staples. A sterile honeycomb dressing was applied to the skin before the leg was wrapped with an Ace wrap to accommodate the Polar Care device. The patient was then awakened and returned to the recovery room in satisfactory condition after tolerating the procedure well.

## 2021-12-19 NOTE — Transfer of Care (Addendum)
Immediate Anesthesia Transfer of Care Note  Patient: Jesse Gardner  Procedure(s) Performed: TOTAL KNEE ARTHROPLASTY (Right: Knee)  Patient Location: PACU  Anesthesia Type:Spinal  Level of Consciousness: awake  Airway & Oxygen Therapy: Patient Spontanous Breathing  Post-op Assessment: Report given to RN  Post vital signs: stable and unstable  Last Vitals:  Vitals Value Taken Time  BP    Temp    Pulse    Resp    SpO2      Last Pain:  Vitals:   12/19/21 0620  TempSrc: Oral  PainSc: 5          Complications: No notable events documented.

## 2021-12-19 NOTE — Progress Notes (Signed)
°   12/19/21 0700  Clinical Encounter Type  Visited With Patient  Visit Type Initial;Pre-op   Chaplain provided pre op support through meaningful conversation and compassionate presence.

## 2021-12-20 ENCOUNTER — Encounter: Payer: Self-pay | Admitting: Surgery

## 2021-12-20 DIAGNOSIS — Z7901 Long term (current) use of anticoagulants: Secondary | ICD-10-CM | POA: Diagnosis not present

## 2021-12-20 DIAGNOSIS — E785 Hyperlipidemia, unspecified: Secondary | ICD-10-CM | POA: Diagnosis not present

## 2021-12-20 DIAGNOSIS — I10 Essential (primary) hypertension: Secondary | ICD-10-CM | POA: Diagnosis not present

## 2021-12-20 DIAGNOSIS — H9190 Unspecified hearing loss, unspecified ear: Secondary | ICD-10-CM | POA: Diagnosis not present

## 2021-12-20 DIAGNOSIS — Z96651 Presence of right artificial knee joint: Secondary | ICD-10-CM | POA: Diagnosis not present

## 2021-12-20 DIAGNOSIS — Z471 Aftercare following joint replacement surgery: Secondary | ICD-10-CM | POA: Diagnosis not present

## 2021-12-20 NOTE — Anesthesia Postprocedure Evaluation (Signed)
Anesthesia Post Note  Patient: Jesse Gardner  Procedure(s) Performed: TOTAL KNEE ARTHROPLASTY (Right: Knee)  Patient location during evaluation: PACU Anesthesia Type: Spinal Level of consciousness: oriented and awake and alert Pain management: pain level controlled Vital Signs Assessment: post-procedure vital signs reviewed and stable Respiratory status: spontaneous breathing, respiratory function stable and patient connected to nasal cannula oxygen Cardiovascular status: blood pressure returned to baseline and stable Postop Assessment: no headache, no backache and patient able to bend at knees Anesthetic complications: no   No notable events documented.   Last Vitals:  Vitals:   12/19/21 1430 12/19/21 1500  BP: (!) 142/83   Pulse: (!) 128 (!) 110  Resp:    Temp:    SpO2: 93%     Last Pain:  Vitals:   12/19/21 1500  TempSrc:   PainSc: 3                  Lenard Simmer

## 2021-12-21 DIAGNOSIS — Z96651 Presence of right artificial knee joint: Secondary | ICD-10-CM | POA: Insufficient documentation

## 2021-12-22 DIAGNOSIS — I10 Essential (primary) hypertension: Secondary | ICD-10-CM | POA: Diagnosis not present

## 2021-12-22 DIAGNOSIS — Z96651 Presence of right artificial knee joint: Secondary | ICD-10-CM | POA: Diagnosis not present

## 2021-12-22 DIAGNOSIS — Z471 Aftercare following joint replacement surgery: Secondary | ICD-10-CM | POA: Diagnosis not present

## 2021-12-22 DIAGNOSIS — E785 Hyperlipidemia, unspecified: Secondary | ICD-10-CM | POA: Diagnosis not present

## 2021-12-22 DIAGNOSIS — H9190 Unspecified hearing loss, unspecified ear: Secondary | ICD-10-CM | POA: Diagnosis not present

## 2021-12-22 DIAGNOSIS — Z7901 Long term (current) use of anticoagulants: Secondary | ICD-10-CM | POA: Diagnosis not present

## 2021-12-25 DIAGNOSIS — E785 Hyperlipidemia, unspecified: Secondary | ICD-10-CM | POA: Diagnosis not present

## 2021-12-25 DIAGNOSIS — Z471 Aftercare following joint replacement surgery: Secondary | ICD-10-CM | POA: Diagnosis not present

## 2021-12-25 DIAGNOSIS — Z7901 Long term (current) use of anticoagulants: Secondary | ICD-10-CM | POA: Diagnosis not present

## 2021-12-25 DIAGNOSIS — H9190 Unspecified hearing loss, unspecified ear: Secondary | ICD-10-CM | POA: Diagnosis not present

## 2021-12-25 DIAGNOSIS — Z96651 Presence of right artificial knee joint: Secondary | ICD-10-CM | POA: Diagnosis not present

## 2021-12-25 DIAGNOSIS — I10 Essential (primary) hypertension: Secondary | ICD-10-CM | POA: Diagnosis not present

## 2021-12-26 ENCOUNTER — Encounter: Payer: Self-pay | Admitting: Surgery

## 2021-12-27 DIAGNOSIS — Z7901 Long term (current) use of anticoagulants: Secondary | ICD-10-CM | POA: Diagnosis not present

## 2021-12-27 DIAGNOSIS — H9190 Unspecified hearing loss, unspecified ear: Secondary | ICD-10-CM | POA: Diagnosis not present

## 2021-12-27 DIAGNOSIS — I10 Essential (primary) hypertension: Secondary | ICD-10-CM | POA: Diagnosis not present

## 2021-12-27 DIAGNOSIS — E785 Hyperlipidemia, unspecified: Secondary | ICD-10-CM | POA: Diagnosis not present

## 2021-12-27 DIAGNOSIS — Z471 Aftercare following joint replacement surgery: Secondary | ICD-10-CM | POA: Diagnosis not present

## 2021-12-27 DIAGNOSIS — Z96651 Presence of right artificial knee joint: Secondary | ICD-10-CM | POA: Diagnosis not present

## 2021-12-28 DIAGNOSIS — Z7901 Long term (current) use of anticoagulants: Secondary | ICD-10-CM | POA: Diagnosis not present

## 2021-12-28 DIAGNOSIS — I10 Essential (primary) hypertension: Secondary | ICD-10-CM | POA: Diagnosis not present

## 2021-12-28 DIAGNOSIS — E785 Hyperlipidemia, unspecified: Secondary | ICD-10-CM | POA: Diagnosis not present

## 2021-12-28 DIAGNOSIS — Z471 Aftercare following joint replacement surgery: Secondary | ICD-10-CM | POA: Diagnosis not present

## 2021-12-28 DIAGNOSIS — H9190 Unspecified hearing loss, unspecified ear: Secondary | ICD-10-CM | POA: Diagnosis not present

## 2021-12-28 DIAGNOSIS — Z96651 Presence of right artificial knee joint: Secondary | ICD-10-CM | POA: Diagnosis not present

## 2022-01-01 DIAGNOSIS — I10 Essential (primary) hypertension: Secondary | ICD-10-CM | POA: Diagnosis not present

## 2022-01-01 DIAGNOSIS — Z96651 Presence of right artificial knee joint: Secondary | ICD-10-CM | POA: Diagnosis not present

## 2022-01-01 DIAGNOSIS — H9190 Unspecified hearing loss, unspecified ear: Secondary | ICD-10-CM | POA: Diagnosis not present

## 2022-01-01 DIAGNOSIS — Z471 Aftercare following joint replacement surgery: Secondary | ICD-10-CM | POA: Diagnosis not present

## 2022-01-01 DIAGNOSIS — Z7901 Long term (current) use of anticoagulants: Secondary | ICD-10-CM | POA: Diagnosis not present

## 2022-01-01 DIAGNOSIS — E785 Hyperlipidemia, unspecified: Secondary | ICD-10-CM | POA: Diagnosis not present

## 2022-01-03 DIAGNOSIS — R29898 Other symptoms and signs involving the musculoskeletal system: Secondary | ICD-10-CM | POA: Diagnosis not present

## 2022-01-03 DIAGNOSIS — M25561 Pain in right knee: Secondary | ICD-10-CM | POA: Diagnosis not present

## 2022-01-03 DIAGNOSIS — M25661 Stiffness of right knee, not elsewhere classified: Secondary | ICD-10-CM | POA: Diagnosis not present

## 2022-01-03 DIAGNOSIS — Z96651 Presence of right artificial knee joint: Secondary | ICD-10-CM | POA: Diagnosis not present

## 2022-01-05 DIAGNOSIS — Z96651 Presence of right artificial knee joint: Secondary | ICD-10-CM | POA: Diagnosis not present

## 2022-01-05 DIAGNOSIS — M25561 Pain in right knee: Secondary | ICD-10-CM | POA: Diagnosis not present

## 2022-01-08 DIAGNOSIS — Z96651 Presence of right artificial knee joint: Secondary | ICD-10-CM | POA: Diagnosis not present

## 2022-01-08 DIAGNOSIS — M25561 Pain in right knee: Secondary | ICD-10-CM | POA: Diagnosis not present

## 2022-01-10 DIAGNOSIS — M25561 Pain in right knee: Secondary | ICD-10-CM | POA: Diagnosis not present

## 2022-01-10 DIAGNOSIS — Z96651 Presence of right artificial knee joint: Secondary | ICD-10-CM | POA: Diagnosis not present

## 2022-01-12 DIAGNOSIS — Z96651 Presence of right artificial knee joint: Secondary | ICD-10-CM | POA: Diagnosis not present

## 2022-01-12 DIAGNOSIS — M25561 Pain in right knee: Secondary | ICD-10-CM | POA: Diagnosis not present

## 2022-01-17 DIAGNOSIS — M25561 Pain in right knee: Secondary | ICD-10-CM | POA: Diagnosis not present

## 2022-01-17 DIAGNOSIS — Z96651 Presence of right artificial knee joint: Secondary | ICD-10-CM | POA: Diagnosis not present

## 2022-01-19 DIAGNOSIS — Z96651 Presence of right artificial knee joint: Secondary | ICD-10-CM | POA: Diagnosis not present

## 2022-01-23 DIAGNOSIS — Z96651 Presence of right artificial knee joint: Secondary | ICD-10-CM | POA: Diagnosis not present

## 2022-01-23 DIAGNOSIS — M25561 Pain in right knee: Secondary | ICD-10-CM | POA: Diagnosis not present

## 2022-01-25 DIAGNOSIS — M25661 Stiffness of right knee, not elsewhere classified: Secondary | ICD-10-CM | POA: Diagnosis not present

## 2022-01-25 DIAGNOSIS — Z96651 Presence of right artificial knee joint: Secondary | ICD-10-CM | POA: Diagnosis not present

## 2022-01-30 DIAGNOSIS — Z96651 Presence of right artificial knee joint: Secondary | ICD-10-CM | POA: Diagnosis not present

## 2022-02-01 DIAGNOSIS — Z96651 Presence of right artificial knee joint: Secondary | ICD-10-CM | POA: Diagnosis not present

## 2022-02-01 DIAGNOSIS — M25561 Pain in right knee: Secondary | ICD-10-CM | POA: Diagnosis not present

## 2022-02-05 DIAGNOSIS — Z96651 Presence of right artificial knee joint: Secondary | ICD-10-CM | POA: Diagnosis not present

## 2022-02-05 DIAGNOSIS — M1711 Unilateral primary osteoarthritis, right knee: Secondary | ICD-10-CM | POA: Diagnosis not present

## 2022-02-06 ENCOUNTER — Other Ambulatory Visit: Payer: Self-pay | Admitting: Family Medicine

## 2022-02-06 DIAGNOSIS — J3089 Other allergic rhinitis: Secondary | ICD-10-CM

## 2022-02-06 NOTE — Telephone Encounter (Signed)
Requested medication (s) are due for refill today - yes ? ?Requested medication (s) are on the active medication list -yes ? ?Future visit scheduled -no ? ?Last refill: 11/11/21 48g ? ?Notes to clinic: Request RF: non delegated Rx ? ?Requested Prescriptions  ?Pending Prescriptions Disp Refills  ? fluticasone (FLONASE) 50 MCG/ACT nasal spray [Pharmacy Med Name: FLUTICASONE NASAL SP (120) RX] 48 g 0  ?  Sig: SHAKE LIQUID AND USE 2 SPRAYS IN EACH NOSTRIL DAILY FOR 4 TO 6 WEEKS THEN STOP AND USE SEASONALLY OR AS NEEDED  ?  ? Not Delegated - Ear, Nose, and Throat: Nasal Preparations - Corticosteroids Failed - 02/06/2022  3:17 AM  ?  ?  Failed - This refill cannot be delegated  ?  ?  Passed - Valid encounter within last 12 months  ?  Recent Outpatient Visits   ? ?      ? 2 months ago Essential hypertension  ? [facility] Smitty Cords, DO  ? 5 months ago Annual physical exam  ? [facility] Smitty Cords, DO  ? 1 year ago Essential hypertension  ? [facility] Smitty Cords, DO  ? 1 year ago Pure hypercholesterolemia  ? [facility] [place], Netta Neat, DO  ? 1 year ago Right otitis media, unspecified otitis media type  ? [facility], Jodelle Gross, FNP  ? ?  ?  ?Future Appointments   ? ?        ? In 2 months Vanna Scotland, MD [facility]  ? ?  ? ?  ?  ?  ? ? ? ?Requested Prescriptions  ?Pending Prescriptions Disp Refills  ? fluticasone (FLONASE) 50 MCG/ACT nasal spray [Pharmacy Med Name: FLUTICASONE NASAL SP (120) RX] 48 g 0  ?  Sig: SHAKE LIQUID AND USE 2 SPRAYS IN EACH NOSTRIL DAILY FOR 4 TO 6 WEEKS THEN STOP AND USE SEASONALLY OR AS NEEDED  ?  ? Not Delegated - Ear, Nose, and Throat: Nasal Preparations - Corticosteroids Failed - 02/06/2022  3:17 AM  ?  ?  Failed - This refill cannot be delegated  ?  ?  Passed - Valid encounter within last 12 months  ?  Recent Outpatient  Visits   ? ?      ? 2 months ago Essential hypertension  ? [facility] Smitty Cords, DO  ? 5 months ago Annual physical exam  ? [facility] Smitty Cords, DO  ? 1 year ago Essential hypertension  ? [facility] Smitty Cords, DO  ? 1 year ago Pure hypercholesterolemia  ? [facility], Netta Neat, DO  ? 1 year ago Right otitis media, unspecified otitis media type  ? [facility], Jodelle Gross, FNP  ? ?  ?  ?Future Appointments   ? ?        ? In 2 months Vanna Scotland, MD [facility]  ? ?  ? ?  ?  ?  ? ? ? ?

## 2022-02-08 DIAGNOSIS — M25561 Pain in right knee: Secondary | ICD-10-CM | POA: Diagnosis not present

## 2022-02-08 DIAGNOSIS — Z96651 Presence of right artificial knee joint: Secondary | ICD-10-CM | POA: Diagnosis not present

## 2022-02-12 DIAGNOSIS — M25561 Pain in right knee: Secondary | ICD-10-CM | POA: Diagnosis not present

## 2022-02-12 DIAGNOSIS — Z96651 Presence of right artificial knee joint: Secondary | ICD-10-CM | POA: Diagnosis not present

## 2022-02-14 DIAGNOSIS — Z96651 Presence of right artificial knee joint: Secondary | ICD-10-CM | POA: Diagnosis not present

## 2022-02-14 DIAGNOSIS — M25561 Pain in right knee: Secondary | ICD-10-CM | POA: Diagnosis not present

## 2022-02-16 DIAGNOSIS — Z96651 Presence of right artificial knee joint: Secondary | ICD-10-CM | POA: Diagnosis not present

## 2022-02-20 DIAGNOSIS — M25561 Pain in right knee: Secondary | ICD-10-CM | POA: Diagnosis not present

## 2022-02-20 DIAGNOSIS — Z96651 Presence of right artificial knee joint: Secondary | ICD-10-CM | POA: Diagnosis not present

## 2022-02-22 DIAGNOSIS — M25561 Pain in right knee: Secondary | ICD-10-CM | POA: Diagnosis not present

## 2022-02-22 DIAGNOSIS — Z96651 Presence of right artificial knee joint: Secondary | ICD-10-CM | POA: Diagnosis not present

## 2022-02-27 DIAGNOSIS — Z96651 Presence of right artificial knee joint: Secondary | ICD-10-CM | POA: Diagnosis not present

## 2022-02-27 DIAGNOSIS — M25561 Pain in right knee: Secondary | ICD-10-CM | POA: Diagnosis not present

## 2022-03-01 DIAGNOSIS — Z96651 Presence of right artificial knee joint: Secondary | ICD-10-CM | POA: Diagnosis not present

## 2022-03-01 DIAGNOSIS — M25661 Stiffness of right knee, not elsewhere classified: Secondary | ICD-10-CM | POA: Diagnosis not present

## 2022-03-06 ENCOUNTER — Telehealth: Payer: Self-pay | Admitting: *Deleted

## 2022-03-06 ENCOUNTER — Other Ambulatory Visit: Payer: Self-pay | Admitting: *Deleted

## 2022-03-06 DIAGNOSIS — F17210 Nicotine dependence, cigarettes, uncomplicated: Secondary | ICD-10-CM

## 2022-03-06 DIAGNOSIS — Z87891 Personal history of nicotine dependence: Secondary | ICD-10-CM

## 2022-03-06 DIAGNOSIS — Z96651 Presence of right artificial knee joint: Secondary | ICD-10-CM | POA: Diagnosis not present

## 2022-03-06 DIAGNOSIS — M25661 Stiffness of right knee, not elsewhere classified: Secondary | ICD-10-CM | POA: Diagnosis not present

## 2022-03-06 DIAGNOSIS — Z122 Encounter for screening for malignant neoplasm of respiratory organs: Secondary | ICD-10-CM

## 2022-03-06 NOTE — Telephone Encounter (Signed)
Attempted to contact pt to schedule f/u lung screening CT scan but unable to leave a voicemail. Called and spoke with pt's daughter, Claudette Laws [facility]) she states she will give pt the message to give [place] a call to schedule. Will await call back.  ?

## 2022-03-08 DIAGNOSIS — Z96651 Presence of right artificial knee joint: Secondary | ICD-10-CM | POA: Diagnosis not present

## 2022-03-08 DIAGNOSIS — M25561 Pain in right knee: Secondary | ICD-10-CM | POA: Diagnosis not present

## 2022-03-13 DIAGNOSIS — Z96651 Presence of right artificial knee joint: Secondary | ICD-10-CM | POA: Diagnosis not present

## 2022-03-13 DIAGNOSIS — M25561 Pain in right knee: Secondary | ICD-10-CM | POA: Diagnosis not present

## 2022-03-14 ENCOUNTER — Ambulatory Visit
Admission: RE | Admit: 2022-03-14 | Discharge: 2022-03-14 | Disposition: A | Discharge status: Home / Self Care | Payer: Medicare Other | Source: Ambulatory Visit | Attending: Acute Care | Admitting: Acute Care

## 2022-03-14 DIAGNOSIS — F17210 Nicotine dependence, cigarettes, uncomplicated: Secondary | ICD-10-CM | POA: Insufficient documentation

## 2022-03-14 DIAGNOSIS — Z122 Encounter for screening for malignant neoplasm of respiratory organs: Secondary | ICD-10-CM | POA: Insufficient documentation

## 2022-03-14 DIAGNOSIS — Z87891 Personal history of nicotine dependence: Secondary | ICD-10-CM | POA: Insufficient documentation

## 2022-03-14 IMAGING — CT CT CHEST LUNG CANCER SCREENING LOW DOSE W/O CM
2 of 5 series · 15 of 40 positions shown, 18 images · non-contrast
Comparison: [DATE] screening chest CT.

CLINICAL DATA: 75-year-old asymptomatic male current smoker with
104 pack-year smoking history.



[Series 3: lung 1.00 · axial · 0.78mm/px · z∈[-1210,-912]mm · 12 of 328 slices shown, 15 images]
[im 15/328  mediastinal]
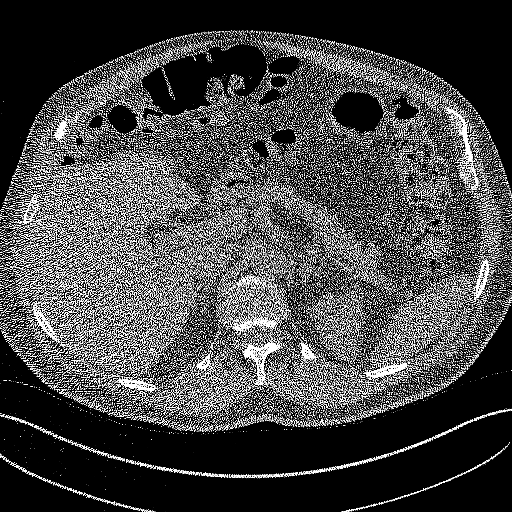
[im 15/328  lung]
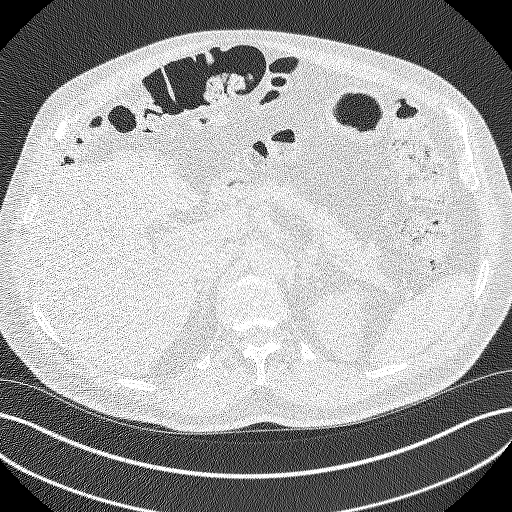
[im 45/328  lung]
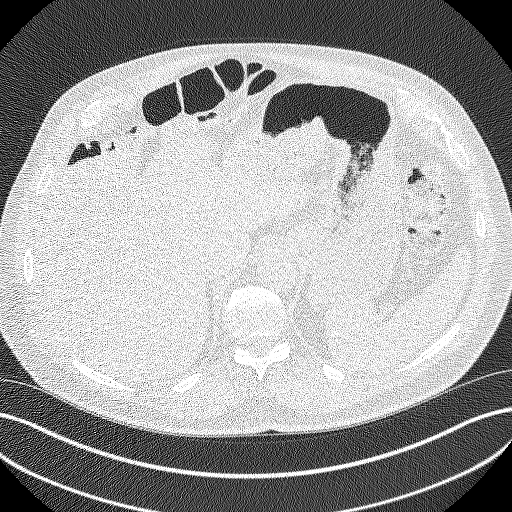
[im 75/328  lung]
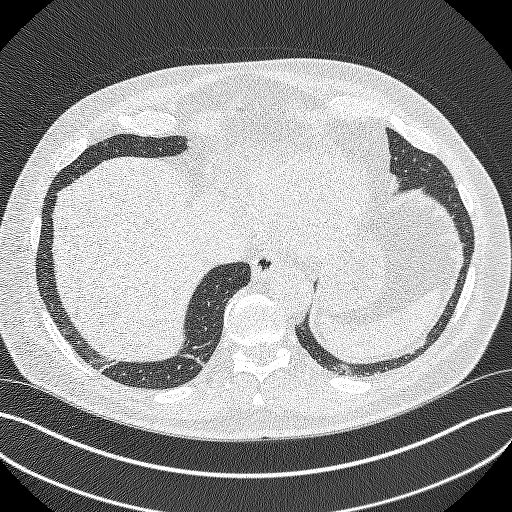
[im 105/328  lung]
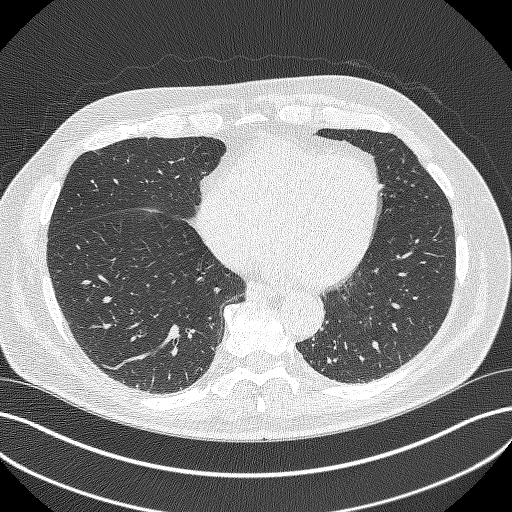
[im 119/328  mediastinal]
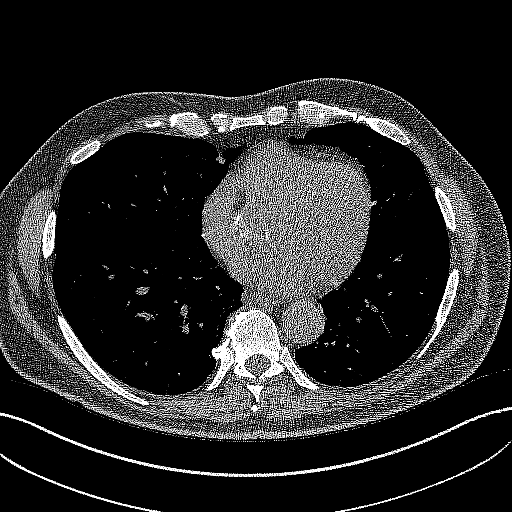
[im 119/328  lung]
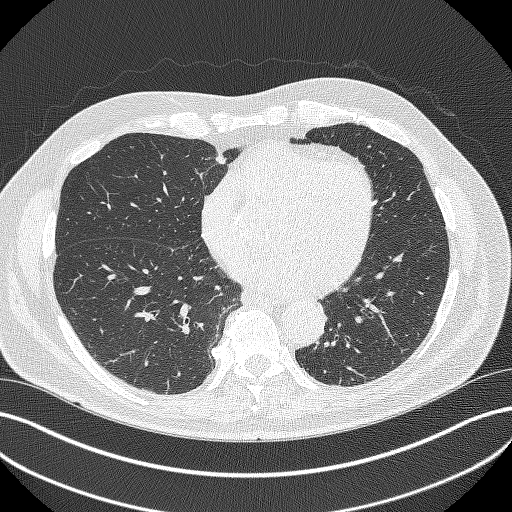
[im 149/328  lung]
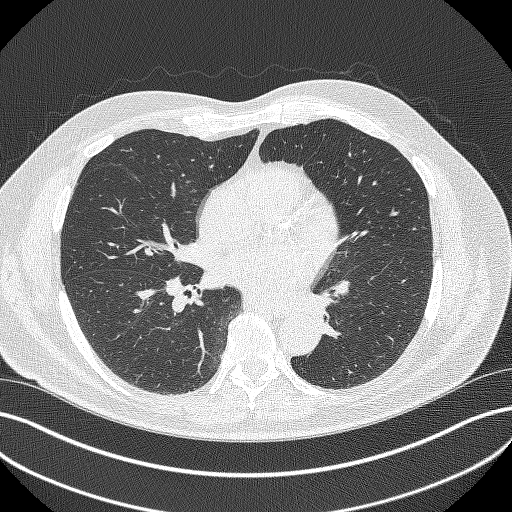
[im 179/328  lung]
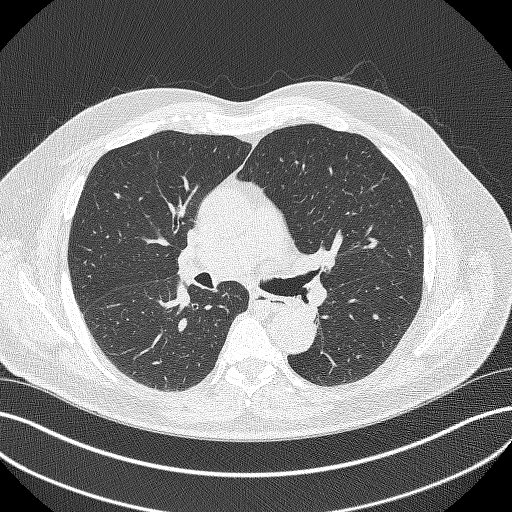
[im 209/328  lung]
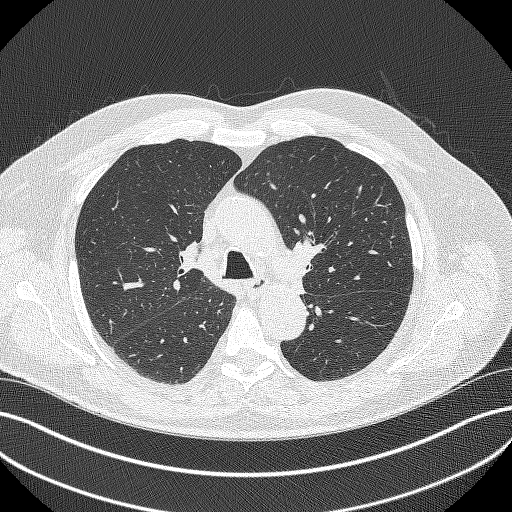
[im 223/328  mediastinal]
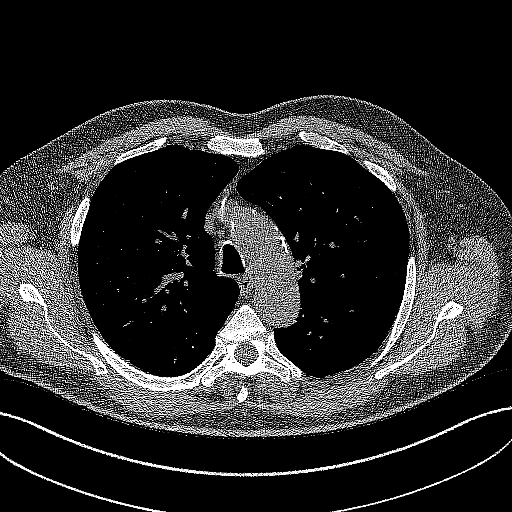
[im 223/328  lung]
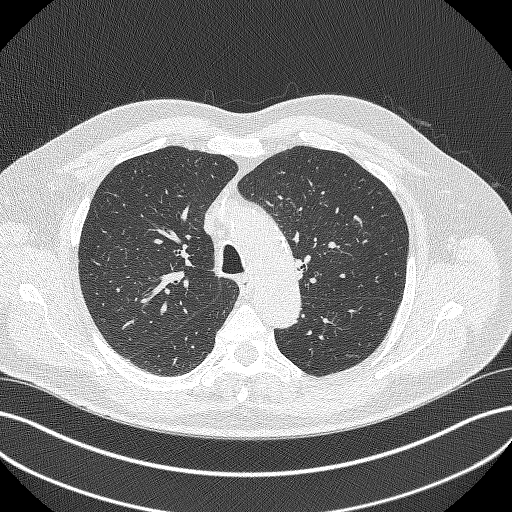
[im 253/328  lung]
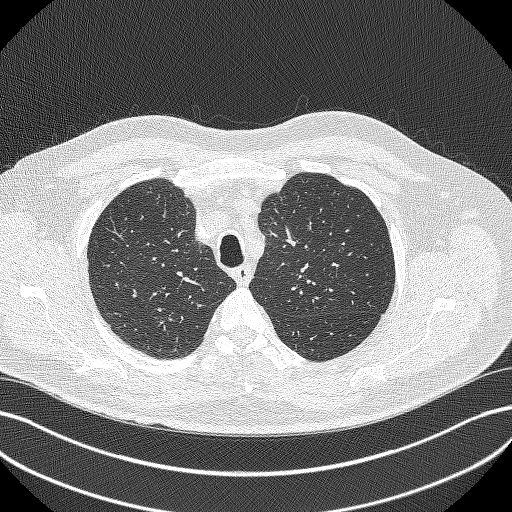
[im 283/328  lung]
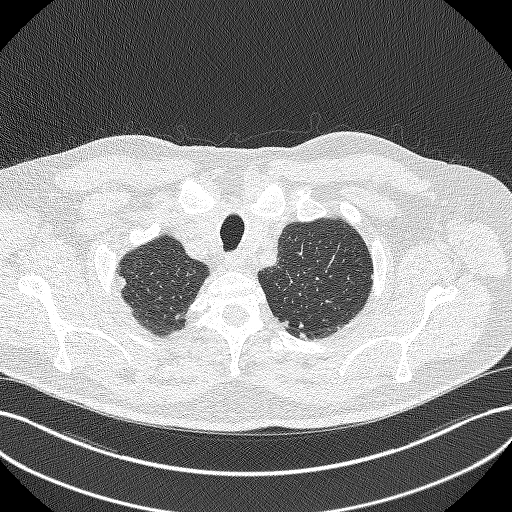
[im 313/328  lung]
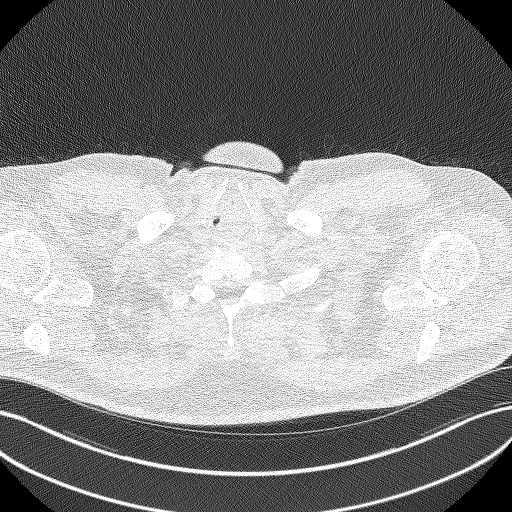

[Series 5: coronals lung 1.00 cor · coronal · 0.64mm/px · 3 of 312 slices shown]
[im 63/312  lung]
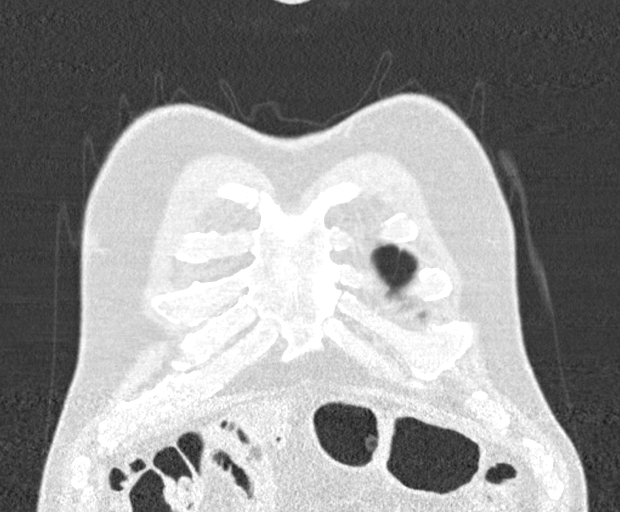
[im 125/312  lung]
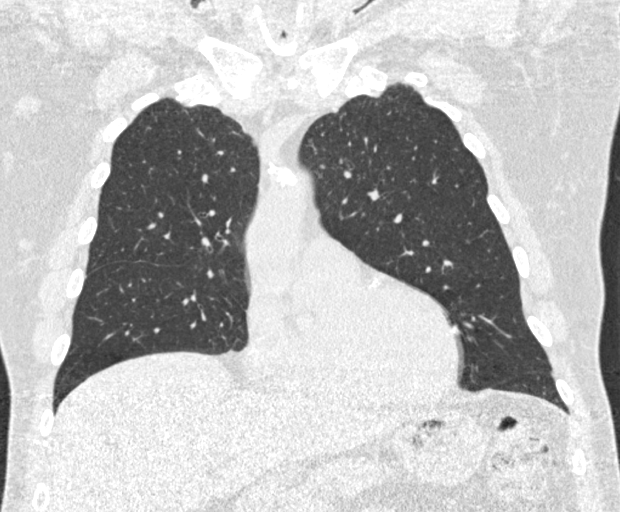
[im 187/312  lung]
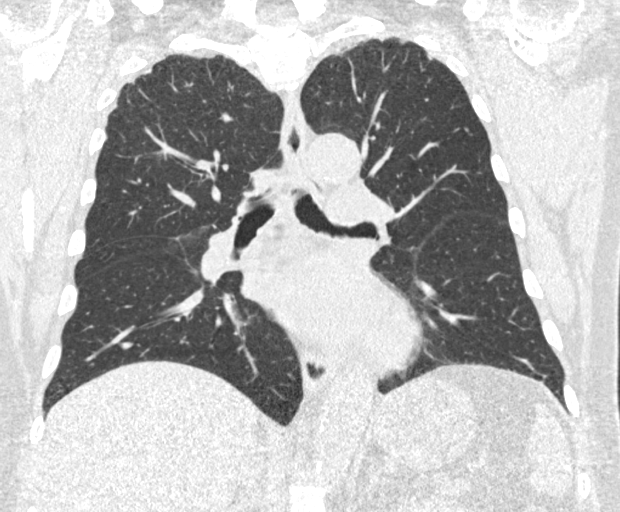

[15 of 40 positions shown; findings below may reference images not displayed]

FINDINGS: Cardiovascular: Normal heart size. No significant pericardial
effusion/thickening. Three-vessel coronary atherosclerosis.
Atherosclerotic nonaneurysmal thoracic aorta. Normal caliber
pulmonary arteries.

Mediastinum/Nodes: No discrete thyroid nodules. Unremarkable
esophagus. No pathologically enlarged axillary, mediastinal or hilar
lymph nodes, noting limited sensitivity for the detection of hilar
adenopathy on this noncontrast study.

Lungs/Pleura: No pneumothorax. No pleural effusion. Mild
centrilobular emphysema with diffuse bronchial wall thickening. No
acute consolidative airspace disease or lung masses. No significant
growth of previously visualized pulmonary nodules. No new
significant pulmonary nodules.

Upper abdomen: No acute abnormality.

Musculoskeletal: No aggressive appearing focal osseous lesions. Mild
thoracic spondylosis.
IMPRESSION: 1. Lung-RADS 2, benign appearance or behavior. Continue annual
screening with low-dose chest CT without contrast in 12 months.
2. Three-vessel coronary atherosclerosis.
3. Aortic Atherosclerosis ([RK]-[RK]) and Emphysema ([RK]-[RK]).

## 2022-03-15 DIAGNOSIS — Z96651 Presence of right artificial knee joint: Secondary | ICD-10-CM | POA: Diagnosis not present

## 2022-03-15 DIAGNOSIS — M25561 Pain in right knee: Secondary | ICD-10-CM | POA: Diagnosis not present

## 2022-03-16 ENCOUNTER — Other Ambulatory Visit: Payer: Self-pay | Admitting: Acute Care

## 2022-03-16 DIAGNOSIS — Z87891 Personal history of nicotine dependence: Secondary | ICD-10-CM

## 2022-03-16 DIAGNOSIS — Z122 Encounter for screening for malignant neoplasm of respiratory organs: Secondary | ICD-10-CM

## 2022-03-16 DIAGNOSIS — F17210 Nicotine dependence, cigarettes, uncomplicated: Secondary | ICD-10-CM

## 2022-03-19 DIAGNOSIS — Z96651 Presence of right artificial knee joint: Secondary | ICD-10-CM | POA: Diagnosis not present

## 2022-03-19 DIAGNOSIS — M1711 Unilateral primary osteoarthritis, right knee: Secondary | ICD-10-CM | POA: Diagnosis not present

## 2022-03-19 DIAGNOSIS — M25561 Pain in right knee: Secondary | ICD-10-CM | POA: Diagnosis not present

## 2022-03-29 DIAGNOSIS — N151 Renal and perinephric abscess: Secondary | ICD-10-CM

## 2022-03-29 HISTORY — DX: Renal and perinephric abscess: N15.1

## 2022-04-12 ENCOUNTER — Other Ambulatory Visit: Payer: Self-pay | Admitting: *Deleted

## 2022-04-12 DIAGNOSIS — N281 Cyst of kidney, acquired: Secondary | ICD-10-CM

## 2022-04-16 ENCOUNTER — Ambulatory Visit
Admission: RE | Admit: 2022-04-16 | Discharge: 2022-04-16 | Disposition: A | Discharge status: Home / Self Care | Payer: Medicare Other | Source: Ambulatory Visit | Attending: Urology | Admitting: Urology

## 2022-04-16 DIAGNOSIS — Z87442 Personal history of urinary calculi: Secondary | ICD-10-CM | POA: Diagnosis not present

## 2022-04-16 DIAGNOSIS — N281 Cyst of kidney, acquired: Secondary | ICD-10-CM | POA: Insufficient documentation

## 2022-04-16 IMAGING — US US RENAL
1 series · 14 of 25 positions shown · non-contrast
Comparison: Prior CT [DATE] and MRI [DATE]

CLINICAL DATA: History of renal cysts and stones.

EXAM:
RENAL / URINARY TRACT ULTRASOUND COMPLETE

[Series 1: us renal · 0.26mm/px · 47 acquisitions, 14 frames shown]
[im 1/47]
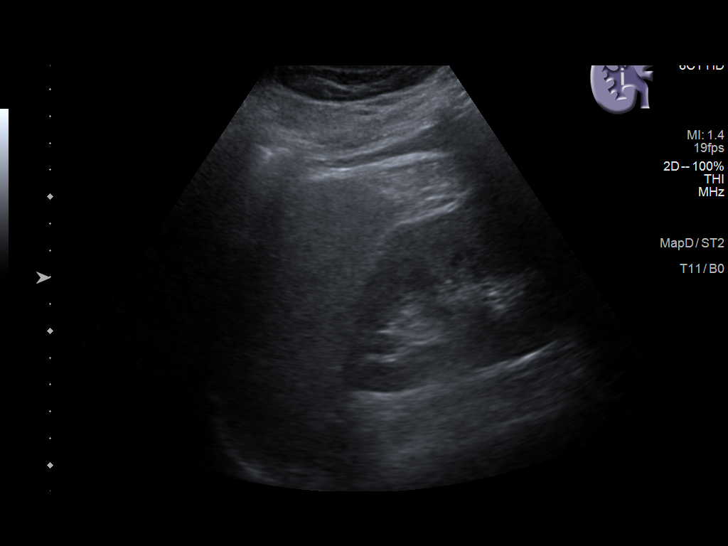
[im 4/47]
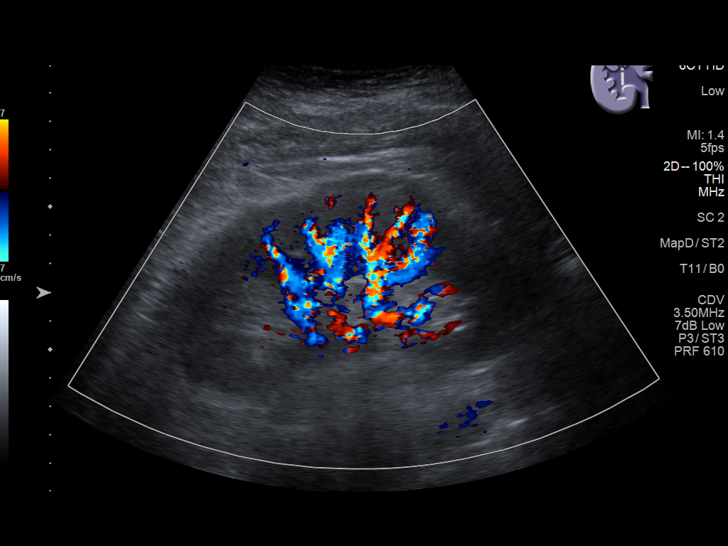
[im 8/47]
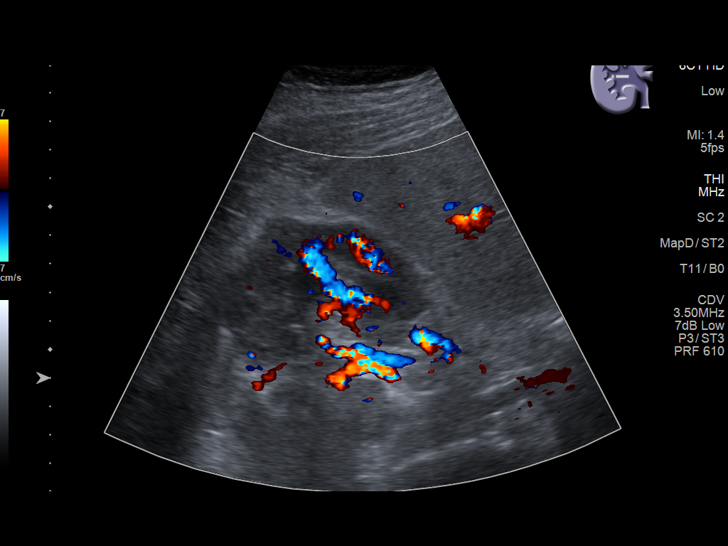
[im 12/47]
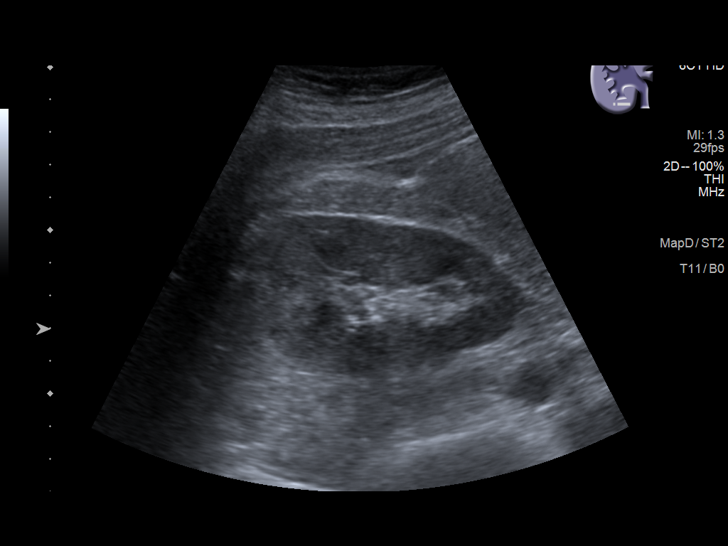
[im 16/47]
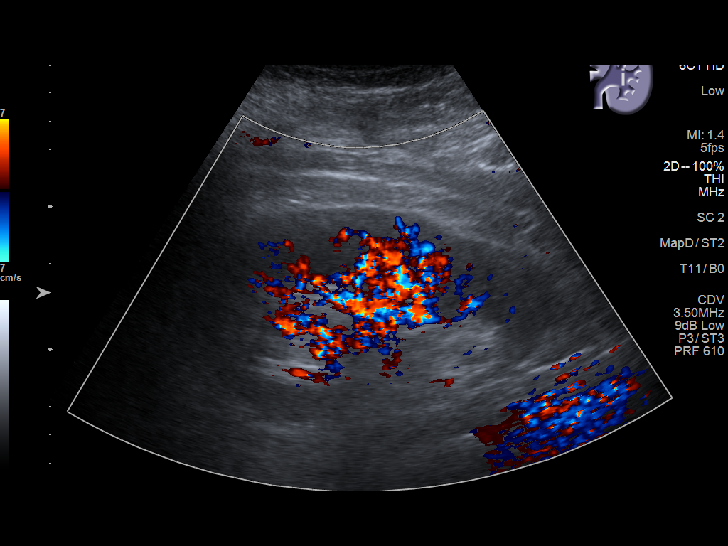
[im 18/47]
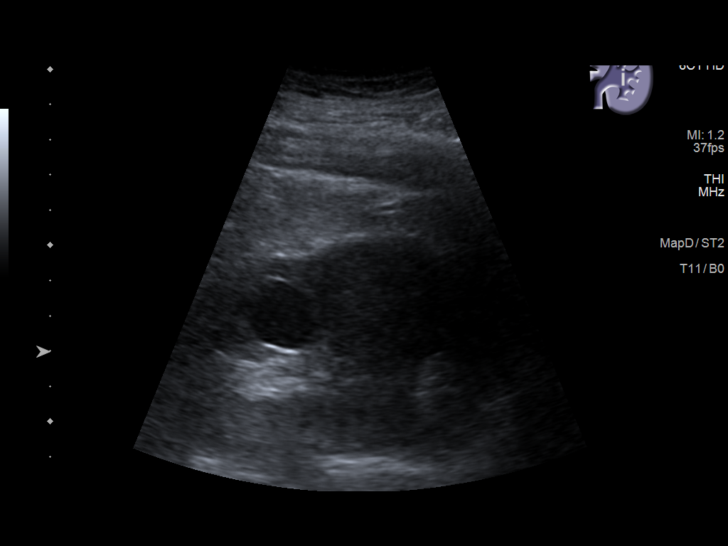
[im 22/47]
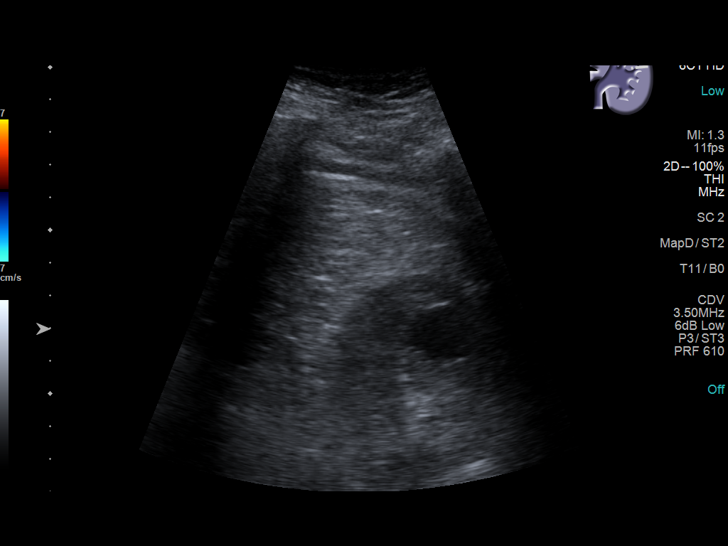
[im 25/47]
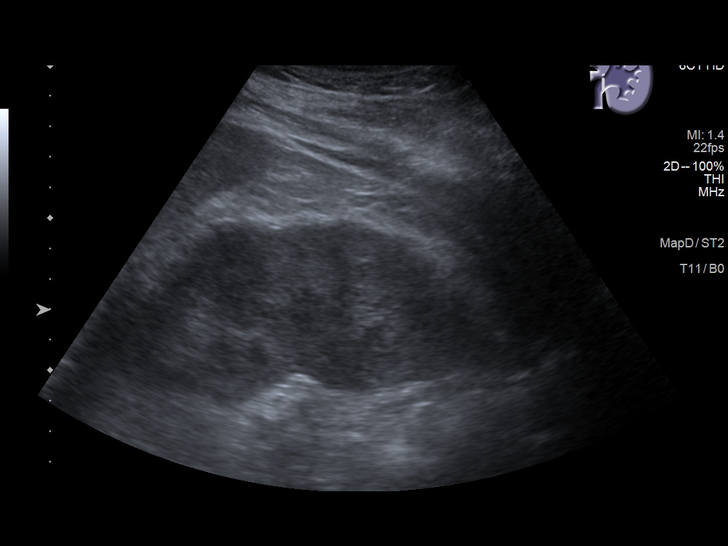
[im 29/47]
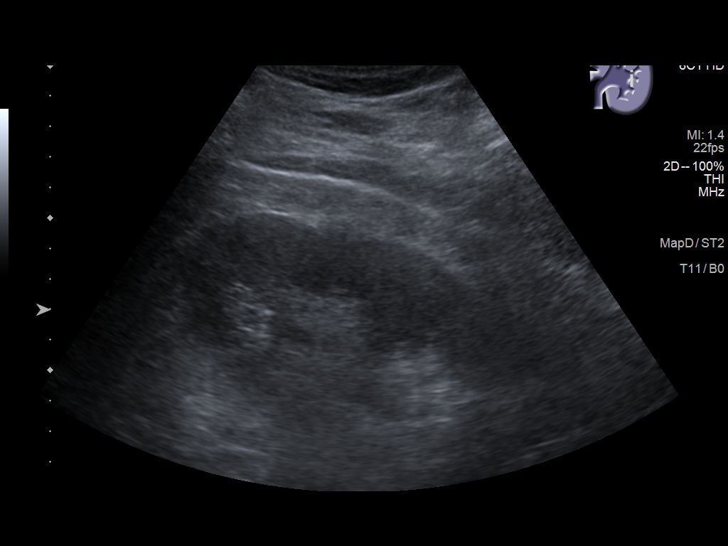
[im 31/47]
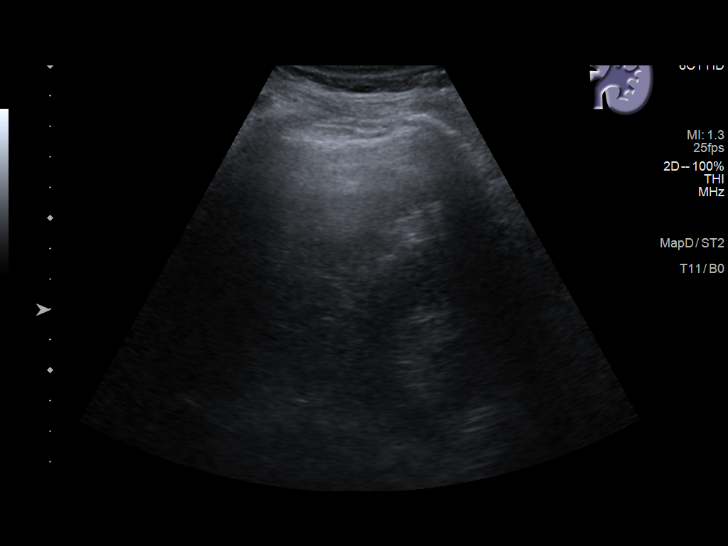
[im 35/47]
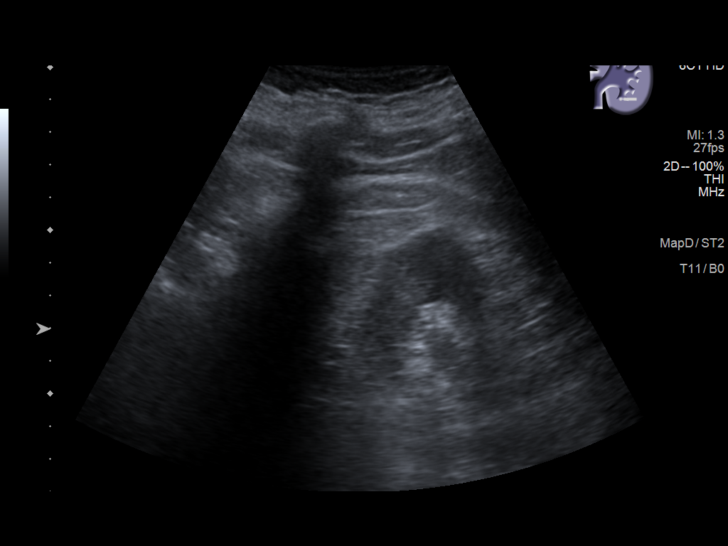
[im 39/47]
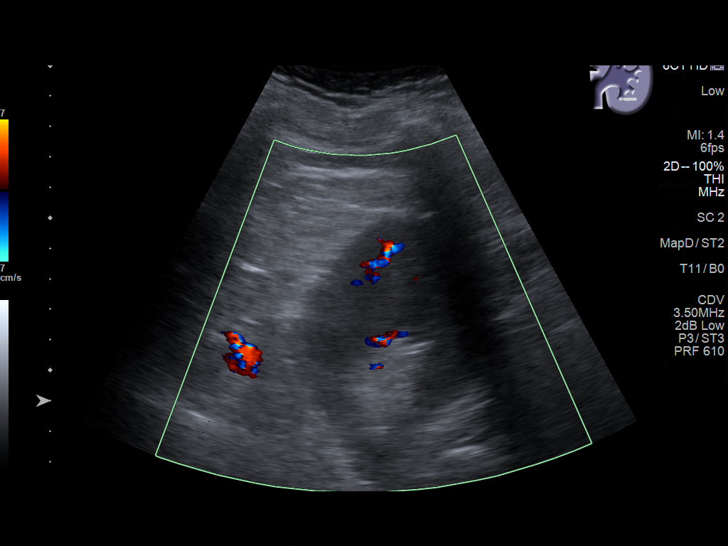
[im 43/47]
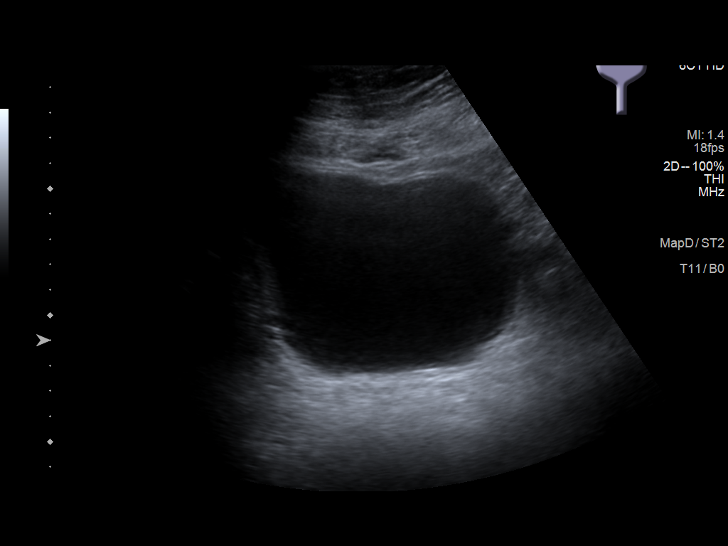
[im 47/47]
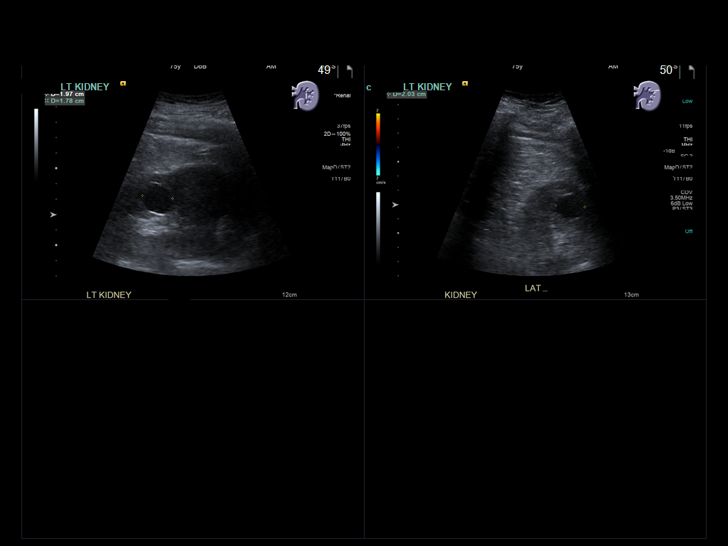

[14 of 25 positions shown; findings below may reference images not displayed]

FINDINGS: Right Kidney:

Renal measurements: 13.2 x 5.7 x 6.1 cm = volume: 241.2 mL.
Echogenicity within normal limits. No mass or hydronephrosis
visualized.

Left Kidney:

Renal measurements: 13.1 x 6.7 x 6.0 cm = volume: 276.8 mL.
Echogenicity within normal limits. No mass or hydronephrosis
visualized. Scarring inferior pole. Note is made of a 2 cm cyst
within the midpole of the left kidney.

Bladder:

Appears normal for degree of bladder distention.

Other:

None.
IMPRESSION: No hydronephrosis.

## 2022-04-17 ENCOUNTER — Ambulatory Visit: Payer: Medicare Other | Admitting: Urology

## 2022-04-17 ENCOUNTER — Encounter: Payer: Self-pay | Admitting: Urology

## 2022-04-17 VITALS — BP 135/74 | HR 111 | Ht 76.0 in | Wt 245.0 lb

## 2022-04-17 DIAGNOSIS — R310 Gross hematuria: Secondary | ICD-10-CM

## 2022-04-17 DIAGNOSIS — Z87448 Personal history of other diseases of urinary system: Secondary | ICD-10-CM

## 2022-04-17 LAB — URINALYSIS, COMPLETE
Bilirubin, UA: NEGATIVE
Glucose, UA: NEGATIVE
Ketones, UA: NEGATIVE
Nitrite, UA: NEGATIVE
Protein,UA: NEGATIVE
RBC, UA: NEGATIVE
Specific Gravity, UA: 1.015 (ref 1.005–1.030)
Urobilinogen, Ur: 0.2 mg/dL (ref 0.2–1.0)
pH, UA: 5 (ref 5.0–7.5)

## 2022-04-17 LAB — MICROSCOPIC EXAMINATION: RBC, Urine: NONE SEEN /hpf (ref 0–2)

## 2022-04-17 NOTE — Progress Notes (Signed)
04/17/22 9:54 AM   Jesse Gardner September 13, 1946 [id number]  Referring provider:  Smitty Cords, DO [address] [place],  [place] [address] Chief Complaint  Patient presents with   Follow-up    1 year w/RUS     HPI: Jesse Gardner is a 76 y.o.male with a personal history of an incidental left renal abscess at the time of hematuria evaluation in 05/2020 who presents today for 1 year follow-up with RUS and UA.  He initially presented for hematuria evaluation.  Cystoscopy was unremarkable.  On CT urogram 05/2020 he was found to have a large completely incidental cystic mass in his kidney measuring 9.7 cm.  He underwent percutaneous drainage.  Cytology was negative.    Serial imaging in the form of RUS on 04/16/2022 visualized no hydronephrosis.  Scarring is noted on the left inferior pole.  He has a 2 cm cyst within the midpole of this kidney.  He had a recent PSA on 11/08/2021 that was 2.07.   He denies any dysuria, flank pain, or gross hematuria.    He recently had a knee replaced and has been doing well from this.  He is planning on having the next replaced this in the near future.  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 If you have any questions, ask your nurse or doctor.          STOP taking these medications    apixaban 2.5 MG Tabs tablet Commonly known as:  Eliquis Stopped by: Vanna Scotland, MD   ipratropium 0.06 % nasal spray Commonly known as: ATROVENT Stopped by: Vanna Scotland, MD   oxyCODONE 5 MG immediate release tablet Commonly known as: Roxicodone Stopped by: Vanna Scotland, MD   rosuvastatin 10 MG tablet Commonly known as: CRESTOR Stopped by: Vanna Scotland, MD       TAKE these medications    fluticasone 50 MCG/ACT nasal spray Commonly known as: FLONASE SHAKE LIQUID AND USE 2 SPRAYS IN EACH NOSTRIL DAILY FOR 4 TO 6 WEEKS THEN STOP AND USE SEASONALLY OR AS NEEDED   losartan 50 MG tablet Commonly known as: COZAAR TAKE 1 TABLET(50 MG) BY MOUTH DAILY   Melatonin 5 MG Caps Take 5 mg by mouth at bedtime as needed (sleep).   OSTEO BI-FLEX TRIPLE STRENGTH PO Take 1 capsule by mouth daily.        Allergies:  No Known Allergies  Family History: Family History  Problem Relation Age of Onset   Alzheimer's disease Mother    Diabetes Father    Prostate cancer Brother     Social History:  reports that he has been smoking cigarettes. He has a 14.25 pack-year smoking history. He has never used smokeless tobacco. He reports current alcohol use  of about 2.0 standard drinks of alcohol per week. He reports that he does not use drugs.   Physical Exam: BP 135/74   Pulse (!) 111   Ht 6\' 4"  (1.93 m)   Wt 245 lb (111.1 kg)   BMI 29.82 kg/m   Constitutional:  Alert and oriented, No acute distress. HEENT: [place] AT, moist mucus membranes.  Trachea midline, no masses. Cardiovascular: No clubbing, cyanosis, or edema. Respiratory: Normal respiratory effort, no increased work of breathing. Skin: No rashes, bruises or suspicious lesions. Neurologic: Grossly intact, no focal deficits, moving all 4 extremities. Psychiatric: Normal mood and affect.  Laboratory Data:  Lab Results  Component Value Date   CREATININE 0.75 12/05/2021   Lab Results  Component Value Date   HGBA1C 5.9 (H) 08/08/2021    Urinalysis Results for  orders placed or performed in visit on 04/17/22  Microscopic Examination   Urine  Result Value Ref Range   WBC, UA 6-10 (A) 0 - 5 /hpf   RBC None seen 0 - 2 /hpf   Epithelial Cells (non renal) 0-10 0 - 10 /hpf   Bacteria, UA Moderate (A) None seen/Few  Urinalysis, Complete  Result Value Ref Range   Specific Gravity, UA 1.015 1.005 - 1.030   pH, UA 5.0 5.0 - 7.5   Color, UA Yellow Yellow   Appearance Ur Clear Clear   Leukocytes,UA Trace (A) Negative   Protein,UA Negative Negative/Trace   Glucose, UA Negative Negative   Ketones, UA Negative Negative   RBC, UA Negative Negative   Bilirubin, UA Negative Negative   Urobilinogen, Ur 0.2 0.2 - 1.0 mg/dL   Nitrite, UA Negative Negative   Microscopic Examination See below:      Pertinent Imaging: CLINICAL DATA:  History of renal cysts and stones.   EXAM: RENAL / URINARY TRACT ULTRASOUND COMPLETE   COMPARISON:  Prior CT 05/30/2020 and MRI 03/29/2021   FINDINGS: Right Kidney:   Renal measurements: 13.2 x 5.7 x 6.1 cm = volume: 241.2 mL. Echogenicity within normal limits. No mass or hydronephrosis visualized.   Left Kidney:   Renal measurements: 13.1 x 6.7 x 6.0 cm = volume: 276.8 mL. Echogenicity within normal limits. No mass or hydronephrosis visualized. Scarring inferior pole. Note is made of a 2 cm cyst within the midpole of the left kidney.   Bladder:   Appears normal for degree of bladder distention.   Other:   None.   IMPRESSION: No hydronephrosis.     Electronically Signed   By: 05/29/2021 M.D.   On: 04/16/2022 14:01   I have personally reviewed the images and agree with radiologist interpretation.    Assessment & Plan:    1. 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  History of  hematuria -Urinalysis is negative today  Return if symptoms worsen or fail to improve.  04/18/2022 as a Tawni Millers for Neurosurgeon, MD.,have documented all relevant documentation on the behalf of Vanna Scotland, MD,as directed by  Vanna Scotland, MD while in the presence of Vanna Scotland, MD.  I have reviewed the above documentation for accuracy and completeness, and I agree with the above.   Vanna Scotland, MD    [facility] [address] [place], [place] [phone]  I spent 31 total  minutes on the day of the encounter including pre-visit review of the medical record, face-to-face time with the patient, and post visit ordering of labs/imaging/tests.

## 2022-06-25 DIAGNOSIS — Z96651 Presence of right artificial knee joint: Secondary | ICD-10-CM | POA: Diagnosis not present

## 2022-06-25 DIAGNOSIS — M170 Bilateral primary osteoarthritis of knee: Secondary | ICD-10-CM | POA: Diagnosis not present

## 2022-07-10 ENCOUNTER — Other Ambulatory Visit: Payer: Self-pay | Admitting: Family Medicine

## 2022-07-10 DIAGNOSIS — I10 Essential (primary) hypertension: Secondary | ICD-10-CM

## 2022-07-11 NOTE — Telephone Encounter (Signed)
Requested Prescriptions  Pending Prescriptions Disp Refills  . losartan (COZAAR) 50 MG tablet [Pharmacy Med Name: LOSARTAN 50MG  TABLETS] 90 tablet 0    Sig: TAKE 1 TABLET(50 MG) BY MOUTH DAILY     Cardiovascular:  Angiotensin Receptor Blockers Failed - 07/10/2022  3:19 AM      Failed - Cr in normal range and within 180 days    Creat  Date Value Ref Range Status  08/08/2021 0.78 0.70 - 1.28 mg/dL Final   Creatinine, Ser  Date Value Ref Range Status  12/05/2021 0.75 0.61 - 1.24 mg/dL Final         Failed - K in normal range and within 180 days    Potassium  Date Value Ref Range Status  12/05/2021 4.1 3.5 - 5.1 mmol/L Final         Failed - Valid encounter within last 6 months    Recent Outpatient Visits          7 months ago Essential hypertension   [facility], DO   11 months ago Annual physical exam   [facility], DO   1 year ago Essential hypertension   [facility], DO   1 year ago Pure hypercholesterolemia   [facility] [place] [facility], DO   1 year ago Right otitis media, unspecified otitis media type   [facility], FNP      Future Appointments            In 1 month Jodelle Gross, Althea Charon, DO [facility]           Passed - Patient is not pregnant      Passed - Last BP in normal range    BP Readings from Last 1 Encounters:  04/17/22 135/74

## 2022-08-13 ENCOUNTER — Other Ambulatory Visit: Payer: Self-pay

## 2022-08-13 DIAGNOSIS — Z0000 Encounter for general adult medical examination without abnormal findings: Secondary | ICD-10-CM

## 2022-08-13 DIAGNOSIS — I700 Atherosclerosis of aorta: Secondary | ICD-10-CM

## 2022-08-13 DIAGNOSIS — R972 Elevated prostate specific antigen [PSA]: Secondary | ICD-10-CM

## 2022-08-13 DIAGNOSIS — I10 Essential (primary) hypertension: Secondary | ICD-10-CM

## 2022-08-13 DIAGNOSIS — R7303 Prediabetes: Secondary | ICD-10-CM

## 2022-08-13 DIAGNOSIS — E7800 Pure hypercholesterolemia, unspecified: Secondary | ICD-10-CM

## 2022-08-13 DIAGNOSIS — N401 Enlarged prostate with lower urinary tract symptoms: Secondary | ICD-10-CM

## 2022-08-14 ENCOUNTER — Other Ambulatory Visit: Payer: Medicare Other

## 2022-08-14 DIAGNOSIS — E7800 Pure hypercholesterolemia, unspecified: Secondary | ICD-10-CM | POA: Diagnosis not present

## 2022-08-14 DIAGNOSIS — R7303 Prediabetes: Secondary | ICD-10-CM | POA: Diagnosis not present

## 2022-08-14 DIAGNOSIS — I10 Essential (primary) hypertension: Secondary | ICD-10-CM | POA: Diagnosis not present

## 2022-08-15 LAB — HEMOGLOBIN A1C
Hgb A1c MFr Bld: 6.3 %{Hb} — ABNORMAL HIGH
Mean Plasma Glucose: 134 mg/dL
eAG (mmol/L): 7.4 mmol/L

## 2022-08-15 LAB — CBC WITH DIFFERENTIAL/PLATELET
Absolute Monocytes: 1045 {cells}/uL — ABNORMAL HIGH (ref 200–950)
Basophils Absolute: 44 {cells}/uL (ref 0–200)
Basophils Relative: 0.4 %
Eosinophils Absolute: 209 {cells}/uL (ref 15–500)
Eosinophils Relative: 1.9 %
HCT: 46 % (ref 38.5–50.0)
Hemoglobin: 15.6 g/dL (ref 13.2–17.1)
Lymphs Abs: 3784 {cells}/uL (ref 850–3900)
MCH: 29.2 pg (ref 27.0–33.0)
MCHC: 33.9 g/dL (ref 32.0–36.0)
MCV: 86 fL (ref 80.0–100.0)
MPV: 12.1 fL (ref 7.5–12.5)
Monocytes Relative: 9.5 %
Neutro Abs: 5918 {cells}/uL (ref 1500–7800)
Neutrophils Relative %: 53.8 %
Platelets: 275 Thousand/uL (ref 140–400)
RBC: 5.35 Million/uL (ref 4.20–5.80)
RDW: 12.6 % (ref 11.0–15.0)
Total Lymphocyte: 34.4 %
WBC: 11 Thousand/uL — ABNORMAL HIGH (ref 3.8–10.8)

## 2022-08-15 LAB — COMPLETE METABOLIC PANEL WITHOUT GFR
AG Ratio: 1.5 (calc) (ref 1.0–2.5)
ALT: 21 U/L (ref 9–46)
AST: 17 U/L (ref 10–35)
Albumin: 4.8 g/dL (ref 3.6–5.1)
Alkaline phosphatase (APISO): 78 U/L (ref 35–144)
BUN: 12 mg/dL (ref 7–25)
CO2: 26 mmol/L (ref 20–32)
Calcium: 9.6 mg/dL (ref 8.6–10.3)
Chloride: 102 mmol/L (ref 98–110)
Creat: 0.86 mg/dL (ref 0.70–1.28)
Globulin: 3.1 g/dL (ref 1.9–3.7)
Glucose, Bld: 100 mg/dL — ABNORMAL HIGH (ref 65–99)
Potassium: 4.2 mmol/L (ref 3.5–5.3)
Sodium: 139 mmol/L (ref 135–146)
Total Bilirubin: 0.6 mg/dL (ref 0.2–1.2)
Total Protein: 7.9 g/dL (ref 6.1–8.1)
eGFR: 90 mL/min/1.73m2

## 2022-08-15 LAB — LIPID PANEL
Cholesterol: 194 mg/dL
HDL: 37 mg/dL — ABNORMAL LOW
LDL Cholesterol (Calc): 134 mg/dL — ABNORMAL HIGH
Non-HDL Cholesterol (Calc): 157 mg/dL — ABNORMAL HIGH
Total CHOL/HDL Ratio: 5.2 (calc) — ABNORMAL HIGH
Triglycerides: 123 mg/dL

## 2022-08-15 LAB — PSA: PSA: 3.28 ng/mL (ref <=4.00)

## 2022-08-17 DIAGNOSIS — M7989 Other specified soft tissue disorders: Secondary | ICD-10-CM | POA: Diagnosis not present

## 2022-08-17 DIAGNOSIS — Z96651 Presence of right artificial knee joint: Secondary | ICD-10-CM | POA: Diagnosis not present

## 2022-08-21 ENCOUNTER — Encounter: Payer: Self-pay | Admitting: Family Medicine

## 2022-08-21 ENCOUNTER — Ambulatory Visit (INDEPENDENT_AMBULATORY_CARE_PROVIDER_SITE_OTHER): Payer: Medicare Other | Admitting: Family Medicine

## 2022-08-21 VITALS — BP 127/73 | HR 110 | Ht 76.0 in | Wt 240.0 lb

## 2022-08-21 DIAGNOSIS — Z0000 Encounter for general adult medical examination without abnormal findings: Secondary | ICD-10-CM | POA: Diagnosis not present

## 2022-08-21 DIAGNOSIS — E7800 Pure hypercholesterolemia, unspecified: Secondary | ICD-10-CM

## 2022-08-21 DIAGNOSIS — N138 Other obstructive and reflux uropathy: Secondary | ICD-10-CM | POA: Diagnosis not present

## 2022-08-21 DIAGNOSIS — N401 Enlarged prostate with lower urinary tract symptoms: Secondary | ICD-10-CM

## 2022-08-21 DIAGNOSIS — I10 Essential (primary) hypertension: Secondary | ICD-10-CM | POA: Diagnosis not present

## 2022-08-21 DIAGNOSIS — I700 Atherosclerosis of aorta: Secondary | ICD-10-CM

## 2022-08-21 DIAGNOSIS — J329 Chronic sinusitis, unspecified: Secondary | ICD-10-CM

## 2022-08-21 DIAGNOSIS — R7303 Prediabetes: Secondary | ICD-10-CM

## 2022-08-21 DIAGNOSIS — Z23 Encounter for immunization: Secondary | ICD-10-CM | POA: Diagnosis not present

## 2022-08-21 DIAGNOSIS — J432 Centrilobular emphysema: Secondary | ICD-10-CM | POA: Diagnosis not present

## 2022-08-21 MED ORDER — IPRATROPIUM BROMIDE 0.06 % NA SOLN: 2.0000 | Freq: Four times a day (QID) | NASAL | 5 refills | Status: DC | PRN

## 2022-08-21 MED ORDER — LOSARTAN POTASSIUM 50 MG PO TABS: 50.0000 mg | ORAL_TABLET | Freq: Every day | ORAL | 3 refills | Status: DC

## 2022-08-21 NOTE — Assessment & Plan Note (Addendum)
Mild elevated A1c to 6.3 Goal to improve diet limit carb starches sugars

## 2022-08-21 NOTE — Progress Notes (Unsigned)
Subjective:    Patient ID: Jesse Gardner, male    DOB: 09/06/1946, 76 y.o.   MRN: [id number]  Jesse Gardner is a 76 y.o. male presenting on 08/21/2022 for Annual Exam   HPI  Here for Annual Physical  Upcoming knee replacement, L in future - R Knee TKR improved  Essential Hypertension / BMI >29 Home BP has been in normal range mostly He does take Sudafed for sinuses often. Sometimes raised BP Current Meds - Losartan $RemoveBe'50mg'rJKLGejxo$  daily Lifestyle: - Diet: Drinks up to pot of coffee daily, drinks tea. Not drinking soft drinks. - Exercise: No longer going to the gym. Limited exercise Denies CP, dyspnea, HA, edema, dizziness / lightheadedness   HYPERLIPIDEMIA: Last lipid panel 07/2022 - LDL down from 150s to 130s On Rosuvastatin $RemoveBeforeD'10mg'etJwyMozkBvCdS$  once a week, he has side effect diarrhea   Allergic Rhinosinusitis / Eustachian Tube Dysfunction Chronic issue impacting him. - Taking Flonase  He takes Claritin D occasionally or Sudafed PRN Needs re order Atrovent PRN nasal   Aortic Atherosclerosis On CT imaging last 12/2020 On intermittent statin Active smoker   Centrilobular Emphysema Tobacco Abuse Following w/ LDCT screening last done 12/2020, due yearly Active smoker Not on maintenance therapy   Osteoarthritis bilateral Knees Chronic Knee Pain He has followed w/ Dr Roland Rack [facility]   Pre-Diabetes A1c 6.3, previous 5.7 to 5.9 range   Health Maintenance: PSA down to 2.07 10/2021, previous range up to 3.93 (3 month ago) Now last update PSA 3.28 (07/2022)  Flu Shot today, Prevnar 104 today     08/21/2022    9:55 AM 11/15/2021    9:19 AM 10/13/2021   10:31 AM  Depression screen PHQ 2/9  Decreased Interest 0 0 0  Down, Depressed, Hopeless 0 0 0  PHQ - 2 Score 0 0 0  Altered sleeping 0 0 0  Tired, decreased energy 0 0 0  Change in appetite 0 0 0  Feeling bad or failure about yourself  0 0 0  Trouble concentrating 0 0 0  Moving slowly or fidgety/restless 0 0 0  Suicidal thoughts  0 0 0  PHQ-9 Score 0 0 0  Difficult doing work/chores Not difficult at all Not difficult at all Not difficult at all    Past Medical History:  Diagnosis Date   Arthritis    Asthma    patient reports "as a child"   COPD (chronic obstructive pulmonary disease) ([place])    Dyspnea    Hypertension    Pre-diabetes    Past Surgical History:  Procedure Laterality Date   APPENDECTOMY     COLONOSCOPY     HERNIA REPAIR     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   Procedure: TOTAL KNEE ARTHROPLASTY;  Surgeon: Corky Mull, MD;  Location: [facility] ORS;  Service: Orthopedics;  Laterality: Right;   Social History   Socioeconomic History   Marital status: Unknown    Spouse name: Not on file   Number of children: Not on file   Years of education: Not on file   Highest education level: Not on file  Occupational History   Occupation: retired  Tobacco Use   Smoking status: Some Days    Packs/day: 0.25    Years: 57.00    Total pack years: 14.25    Types: Cigarettes   Smokeless tobacco: Never   Tobacco comments:    1 cigarette in the last month   Vaping Use   Vaping Use: Never used  Substance and  Sexual Activity   Alcohol use: Yes    Alcohol/week: 2.0 standard drinks of alcohol    Types: 2 Glasses of wine per week   Drug use: Never   Sexual activity: Not on file  Other Topics Concern   Not on file  Social History Narrative   Not on file   Social Determinants of Health   Financial Resource Strain: Low Risk  (10/13/2021)   Overall Financial Resource Strain (CARDIA)    Difficulty of Paying Living Expenses: Not hard at all  Food Insecurity: No Food Insecurity (10/13/2021)   Hunger Vital Sign    Worried About Running Out of Food in the Last Year: Never true    Ran Out of Food in the Last Year: Never true  Transportation Needs: No Transportation Needs (10/13/2021)   PRAPARE - Hydrologist (Medical): No    Lack of Transportation (Non-Medical): No   Physical Activity: Inactive (10/13/2021)   Exercise Vital Sign    Days of Exercise per Week: 0 days    Minutes of Exercise per Session: 0 min  Stress: No Stress Concern Present (10/13/2021)   [place]    Feeling of Stress : Not at all  Social Connections: Moderately Isolated (10/13/2021)   Social Connection and Isolation Panel [NHANES]    Frequency of Communication with Friends and Family: Three times a week    Frequency of Social Gatherings with Friends and Family: Three times a week    Attends Religious Services: Never    Active Member of Clubs or Organizations: No    Attends Archivist Meetings: Never    Marital Status: Living with partner  Intimate Partner Violence: Not At Risk (10/13/2021)   Humiliation, Afraid, Rape, and Kick questionnaire    Fear of Current or Ex-Partner: No    Emotionally Abused: No    Physically Abused: No    Sexually Abused: No   Family History  Problem Relation Age of Onset   Alzheimer's disease Mother    Diabetes Father    Prostate cancer Brother    Current Outpatient Medications on File Prior to Visit  Medication Sig   fluticasone (FLONASE) 50 MCG/ACT nasal spray SHAKE LIQUID AND USE 2 SPRAYS IN EACH NOSTRIL DAILY FOR 4 TO 6 WEEKS THEN STOP AND USE SEASONALLY OR AS NEEDED   Melatonin 5 MG CAPS Take 5 mg by mouth at bedtime as needed (sleep).   Misc Natural Products (OSTEO BI-FLEX TRIPLE STRENGTH PO) Take 1 capsule by mouth daily.   rosuvastatin (CRESTOR) 10 MG tablet Take 10 mg by mouth once a week.   No current facility-administered medications on file prior to visit.    Review of Systems  Constitutional:  Negative for activity change, appetite change, chills, diaphoresis, fatigue and fever.  HENT:  Negative for congestion and hearing loss.   Eyes:  Negative for visual disturbance.  Respiratory:  Negative for cough, chest tightness, shortness of breath and wheezing.    Cardiovascular:  Negative for chest pain, palpitations and leg swelling.  Gastrointestinal:  Negative for abdominal pain, constipation, diarrhea, nausea and vomiting.  Genitourinary:  Negative for dysuria, frequency and hematuria.  Musculoskeletal:  Negative for arthralgias and neck pain.  Skin:  Negative for rash.  Neurological:  Negative for dizziness, weakness, light-headedness, numbness and headaches.  Hematological:  Negative for adenopathy.  Psychiatric/Behavioral:  Negative for behavioral problems, dysphoric mood and sleep disturbance.    Per HPI unless  specifically indicated above      Objective:    BP 127/73   Pulse (!) 110   Ht $R'6\' 4"'Zu$  (1.93 m)   Wt 240 lb (108.9 kg)   SpO2 93%   BMI 29.21 kg/m   Wt Readings from Last 3 Encounters:  08/21/22 240 lb (108.9 kg)  04/17/22 245 lb (111.1 kg)  03/14/22 239 lb (108.4 kg)    Physical Exam Vitals and nursing note reviewed.  Constitutional:      General: He is not in acute distress.    Appearance: He is well-developed. He is not diaphoretic.     Comments: Well-appearing, comfortable, cooperative  HENT:     Head: Normocephalic and atraumatic.  Eyes:     General:        Right eye: No discharge.        Left eye: No discharge.     Conjunctiva/sclera: Conjunctivae normal.     Pupils: Pupils are equal, round, and reactive to light.  Neck:     Thyroid: No thyromegaly.  Cardiovascular:     Rate and Rhythm: Normal rate and regular rhythm.     Pulses: Normal pulses.     Heart sounds: Normal heart sounds. No murmur heard. Pulmonary:     Effort: Pulmonary effort is normal. No respiratory distress.     Breath sounds: Normal breath sounds. No wheezing or rales.  Abdominal:     General: Bowel sounds are normal. There is no distension.     Palpations: Abdomen is soft. There is no mass.     Tenderness: There is no abdominal tenderness.  Musculoskeletal:        General: No tenderness. Normal range of motion.     Cervical back:  Normal range of motion and neck supple.     Comments: Upper / Lower Extremities: - Normal muscle tone, strength bilateral upper extremities 5/5, lower extremities 5/5  Lymphadenopathy:     Cervical: No cervical adenopathy.  Skin:    General: Skin is warm and dry.     Findings: No erythema or rash.  Neurological:     Mental Status: He is alert and oriented to person, place, and time.     Comments: Distal sensation intact to light touch all extremities  Psychiatric:        Mood and Affect: Mood normal.        Behavior: Behavior normal.        Thought Content: Thought content normal.     Comments: Well groomed, good eye contact, normal speech and thoughts       Results for orders placed or performed in visit on 08/13/22  PSA  Result Value Ref Range   PSA 3.28 < OR = 4.00 ng/mL  Hemoglobin A1c  Result Value Ref Range   Hgb A1c MFr Bld 6.3 (H) <5.7 % of total Hgb   Mean Plasma Glucose 134 mg/dL   eAG (mmol/L) 7.4 mmol/L  CBC with Differential/Platelet  Result Value Ref Range   WBC 11.0 (H) 3.8 - 10.8 Thousand/uL   RBC 5.35 4.20 - 5.80 Million/uL   Hemoglobin 15.6 13.2 - 17.1 g/dL   HCT 46.0 38.5 - 50.0 %   MCV 86.0 80.0 - 100.0 fL   MCH 29.2 27.0 - 33.0 pg   MCHC 33.9 32.0 - 36.0 g/dL   RDW 12.6 11.0 - 15.0 %   Platelets 275 140 - 400 Thousand/uL   MPV 12.1 7.5 - 12.5 fL   Neutro Abs 5,918 1,500 -  7,800 cells/uL   Lymphs Abs 3,784 850 - 3,900 cells/uL   Absolute Monocytes 1,045 (H) 200 - 950 cells/uL   Eosinophils Absolute 209 15 - 500 cells/uL   Basophils Absolute 44 0 - 200 cells/uL   Neutrophils Relative % 53.8 %   Total Lymphocyte 34.4 %   Monocytes Relative 9.5 %   Eosinophils Relative 1.9 %   Basophils Relative 0.4 %  Lipid panel  Result Value Ref Range   Cholesterol 194 <200 mg/dL   HDL 37 (L) > OR = 40 mg/dL   Triglycerides 123 <150 mg/dL   LDL Cholesterol (Calc) 134 (H) mg/dL (calc)   Total CHOL/HDL Ratio 5.2 (H) <5.0 (calc)   Non-HDL Cholesterol (Calc)  157 (H) <130 mg/dL (calc)  COMPLETE METABOLIC PANEL WITH GFR  Result Value Ref Range   Glucose, Bld 100 (H) 65 - 99 mg/dL   BUN 12 7 - 25 mg/dL   Creat 0.86 0.70 - 1.28 mg/dL   eGFR 90 > OR = 60 mL/min/1.59m2   BUN/Creatinine Ratio SEE NOTE: 6 - 22 (calc)   Sodium 139 135 - 146 mmol/L   Potassium 4.2 3.5 - 5.3 mmol/L   Chloride 102 98 - 110 mmol/L   CO2 26 20 - 32 mmol/L   Calcium 9.6 8.6 - 10.3 mg/dL   Total Protein 7.9 6.1 - 8.1 g/dL   Albumin 4.8 3.6 - 5.1 g/dL   Globulin 3.1 1.9 - 3.7 g/dL (calc)   AG Ratio 1.5 1.0 - 2.5 (calc)   Total Bilirubin 0.6 0.2 - 1.2 mg/dL   Alkaline phosphatase (APISO) 78 35 - 144 U/L   AST 17 10 - 35 U/L   ALT 21 9 - 46 U/L      Assessment & Plan:   Problem List Items Addressed This Visit     Aortic atherosclerosis ([place])    Identified on last LDCT 2022 On Statin      Relevant Medications   rosuvastatin (CRESTOR) 10 MG tablet   losartan (COZAAR) 50 MG tablet   BPH with obstruction/lower urinary tract symptoms    BPH LUTS Elevated PSA again F/u w Dr Erlene Quan [facility]      Centrilobular emphysema [facility])    Chronic COPD emphysema chronic problem, with active tobacco abuse long history Declined maintenance therapy inhalers previously  Counseling provided Follow yearly LDCT Lung Scan      Relevant Medications   ipratropium (ATROVENT) 0.06 % nasal spray   Essential hypertension    Controlled HTN. Mild elevated repeat check normal - Home BP readings improved avg  No known complications     Plan:  1. Continue current med - Losartan $RemoveBe'50mg'oiymbqmKD$  daily 2. Encourage improved lifestyle - low sodium diet, regular exercise 3. Continue monitor BP outside office, bring readings to next visit, if persistently >140/90 or new symptoms notify office sooner      Relevant Medications   rosuvastatin (CRESTOR) 10 MG tablet   losartan (COZAAR) 50 MG tablet   Pre-diabetes    Mild elevated A1c to 6.3 Goal to improve diet limit carb starches sugars       Pure hypercholesterolemia   Relevant Medications   rosuvastatin (CRESTOR) 10 MG tablet   losartan (COZAAR) 50 MG tablet   Other Visit Diagnoses     Annual physical exam    -  Primary   Needs flu shot       Relevant Orders   Flu Vaccine QUAD High Dose(Fluad)   Need for Streptococcus pneumoniae vaccination  Relevant Orders   Pneumococcal conjugate vaccine 20-valent   Chronic rhinosinusitis       Relevant Medications   ipratropium (ATROVENT) 0.06 % nasal spray       Updated Health Maintenance information Reviewed recent lab results with patient Encouraged improvement to lifestyle with diet and exercise Goal of weight loss   Meds ordered this encounter  Medications   losartan (COZAAR) 50 MG tablet    Sig: Take 1 tablet (50 mg total) by mouth daily.    Dispense:  90 tablet    Refill:  3    Not ready for pick up now. Future refills add for 1 year.   ipratropium (ATROVENT) 0.06 % nasal spray    Sig: Place 2 sprays into both nostrils 4 (four) times daily as needed for rhinitis.    Dispense:  15 mL    Refill:  5      Follow up plan: Return in about 6 months (around 02/20/2023) for 6 month follow-up PreDM A1c, HTN.  Nobie Putnam, [facility] 08/21/2022, 10:15 AM

## 2022-08-21 NOTE — Assessment & Plan Note (Signed)
Identified on last LDCT 2022 On Statin

## 2022-08-21 NOTE — Assessment & Plan Note (Signed)
Controlled HTN. Mild elevated repeat check normal - Home BP readings improved avg  No known complications     Plan:  1. Continue current med - Losartan 50mg  daily 2. Encourage improved lifestyle - low sodium diet, regular exercise 3. Continue monitor BP outside office, bring readings to next visit, if persistently >140/90 or new symptoms notify office sooner

## 2022-08-21 NOTE — Patient Instructions (Addendum)
Thank you for coming to the office today.  Keep on Rosuvastatin 10mg  cholesterol med take once a week  Flu Shot and Pneumonia shot today  Re ordered the Nasal Spray (decongestant) use as needed   Please schedule a Follow-up Appointment to: Return in about 6 months (around 02/20/2023) for 6 month follow-up PreDM A1c, HTN.  If you have any other questions or concerns, please feel free to call the office or send a message through [place]. You may also schedule an earlier appointment if necessary.  Additionally, you may be receiving a survey about your experience at our office within a few days to 1 week by e-mail or mail. We value your feedback.  Nobie Putnam, DO [place]

## 2022-08-21 NOTE — Assessment & Plan Note (Signed)
BPH LUTS Elevated PSA again F/u w Dr Alton Revere

## 2022-08-21 NOTE — Assessment & Plan Note (Signed)
Chronic COPD emphysema chronic problem, with active tobacco abuse long history Declined maintenance therapy inhalers previously  Counseling provided Follow yearly LDCT Lung Scan

## 2022-08-22 DIAGNOSIS — Z23 Encounter for immunization: Secondary | ICD-10-CM

## 2022-08-22 DIAGNOSIS — Z0000 Encounter for general adult medical examination without abnormal findings: Secondary | ICD-10-CM | POA: Diagnosis not present

## 2022-08-22 DIAGNOSIS — E7800 Pure hypercholesterolemia, unspecified: Secondary | ICD-10-CM | POA: Diagnosis not present

## 2022-10-15 DIAGNOSIS — M1712 Unilateral primary osteoarthritis, left knee: Secondary | ICD-10-CM | POA: Diagnosis not present

## 2022-10-15 DIAGNOSIS — G8929 Other chronic pain: Secondary | ICD-10-CM | POA: Diagnosis not present

## 2022-10-15 DIAGNOSIS — M25562 Pain in left knee: Secondary | ICD-10-CM | POA: Diagnosis not present

## 2022-10-16 ENCOUNTER — Other Ambulatory Visit: Payer: Self-pay | Admitting: Surgery

## 2022-10-31 DIAGNOSIS — I959 Hypotension, unspecified: Secondary | ICD-10-CM | POA: Diagnosis not present

## 2022-11-01 ENCOUNTER — Encounter
Admission: RE | Admit: 2022-11-01 | Discharge: 2022-11-01 | Disposition: A | Discharge status: Home / Self Care | Payer: Medicare Other | Source: Ambulatory Visit | Attending: Surgery | Admitting: Surgery

## 2022-11-01 DIAGNOSIS — I4510 Unspecified right bundle-branch block: Secondary | ICD-10-CM | POA: Diagnosis not present

## 2022-11-01 DIAGNOSIS — Z01818 Encounter for other preprocedural examination: Secondary | ICD-10-CM | POA: Diagnosis not present

## 2022-11-01 DIAGNOSIS — Z01812 Encounter for preprocedural laboratory examination: Secondary | ICD-10-CM

## 2022-11-01 HISTORY — DX: Bilateral primary osteoarthritis of knee: M17.0

## 2022-11-01 HISTORY — DX: Atherosclerosis of aorta: I70.0

## 2022-11-01 HISTORY — DX: Essential (primary) hypertension: I10

## 2022-11-01 HISTORY — DX: Hyperlipidemia, unspecified: E78.5

## 2022-11-01 LAB — COMPREHENSIVE METABOLIC PANEL
ALT: 21 U/L (ref 0–44)
AST: 21 U/L (ref 15–41)
Albumin: 4.3 g/dL (ref 3.5–5.0)
Alkaline Phosphatase: 65 U/L (ref 38–126)
Anion gap: 10 (ref 5–15)
BUN: 18 mg/dL (ref 8–23)
CO2: 27 mmol/L (ref 22–32)
Calcium: 9.3 mg/dL (ref 8.9–10.3)
Chloride: 102 mmol/L (ref 98–111)
Creatinine, Ser: 0.78 mg/dL (ref 0.61–1.24)
GFR, Estimated: >60 mL/min (ref >60)
Glucose, Bld: 101 mg/dL — ABNORMAL HIGH (ref 70–99)
Potassium: 3.8 mmol/L (ref 3.5–5.1)
Sodium: 139 mmol/L (ref 135–145)
Total Bilirubin: 0.7 mg/dL (ref 0.3–1.2)
Total Protein: 8 g/dL (ref 6.5–8.1)

## 2022-11-01 LAB — CBC WITH DIFFERENTIAL/PLATELET
Abs Immature Granulocytes: 0.02 10*3/uL (ref 0.00–0.07)
Basophils Absolute: 0.1 10*3/uL (ref 0.0–0.1)
Basophils Relative: 1 %
Eosinophils Absolute: 0.3 10*3/uL (ref 0.0–0.5)
Eosinophils Relative: 3 %
HCT: 44.6 % (ref 39.0–52.0)
Hemoglobin: 14.3 g/dL (ref 13.0–17.0)
Immature Granulocytes: 0 %
Lymphocytes Relative: 36 %
Lymphs Abs: 3.4 10*3/uL (ref 0.7–4.0)
MCH: 28.5 pg (ref 26.0–34.0)
MCHC: 32.1 g/dL (ref 30.0–36.0)
MCV: 89 fL (ref 80.0–100.0)
Monocytes Absolute: 0.9 10*3/uL (ref 0.1–1.0)
Monocytes Relative: 9 %
Neutro Abs: 4.8 10*3/uL (ref 1.7–7.7)
Neutrophils Relative %: 51 %
Platelets: 240 10*3/uL (ref 150–400)
RBC: 5.01 MIL/uL (ref 4.22–5.81)
RDW: 13.5 % (ref 11.5–15.5)
WBC: 9.5 10*3/uL (ref 4.0–10.5)
nRBC: 0 % (ref 0.0–0.2)

## 2022-11-01 LAB — URINALYSIS, ROUTINE W REFLEX MICROSCOPIC
Bilirubin Urine: NEGATIVE
Glucose, UA: NEGATIVE mg/dL
Hgb urine dipstick: NEGATIVE
Ketones, ur: NEGATIVE mg/dL
Nitrite: NEGATIVE
Protein, ur: NEGATIVE mg/dL
Specific Gravity, Urine: 1.005 (ref 1.005–1.030)
pH: 6 (ref 5.0–8.0)

## 2022-11-01 LAB — TYPE AND SCREEN
ABO/RH(D): A POS
Antibody Screen: NEGATIVE

## 2022-11-01 LAB — SURGICAL PCR SCREEN
MRSA, PCR: NEGATIVE
Staphylococcus aureus: NEGATIVE

## 2022-11-01 NOTE — Patient Instructions (Addendum)
Your procedure is scheduled on: Tuesday, January 16 Report to the Registration Desk on the 1st floor of the [facility]. To find out your arrival time, please call [phone] between 1PM - 3PM on: Monday, January 15 If your arrival time is 6:00 am, do not arrive prior to that time as the [facility] entrance doors do not open until 6:00 am.  REMEMBER: Instructions that are not followed completely may result in serious medical risk, up to and including death; or upon the discretion of your surgeon and anesthesiologist your surgery may need to be rescheduled.  Do not eat food after midnight the night before surgery.  No gum chewing, lozengers or hard candies.  You may however, drink CLEAR liquids up to 2 hours before you are scheduled to arrive for your surgery. Do not drink anything within 2 hours of your scheduled arrival time.  Clear liquids include: - water  - apple juice without pulp - gatorade (not RED colors) - black coffee or tea (Do NOT add milk or creamers to the coffee or tea) Do NOT drink anything that is not on this list.  In addition, your doctor has ordered for you to drink the provided  Ensure Pre-Surgery Clear Carbohydrate Drink  Drinking this carbohydrate drink up to two hours before surgery helps to reduce insulin resistance and improve patient outcomes. Please complete drinking 2 hours prior to scheduled arrival time.  DO NOT TAKE ANY MEDICATIONS THE MORNING OF SURGERY   YOU CAN USE YOUR FLONASE NASAL SPRAY PRIOR TO COMING TO [facility].  One week prior to surgery: starting January 9 Stop Anti-inflammatories (NSAIDS) such as Advil, Aleve, Ibuprofen, Motrin, Naproxen, Naprosyn and Aspirin based products such as Excedrin, Goodys Powder, BC Powder. Stop ANY OVER THE COUNTER supplements until after surgery. You may however, continue to take Tylenol if needed for pain up until the day of surgery.  No Alcohol for 24 hours before or after surgery.  No Smoking  including e-cigarettes for 24 hours prior to surgery.  No chewable tobacco products for at least 6 hours prior to surgery.  No nicotine patches on the day of surgery.  Do not use any "recreational" drugs for at least a week prior to your surgery.  Please be advised that the combination of cocaine and anesthesia may have negative outcomes, up to and including death. If you test positive for cocaine, your surgery will be cancelled.  On the morning of surgery brush your teeth with toothpaste and water, you may rinse your mouth with mouthwash if you wish. Do not swallow any toothpaste or mouthwash.  Use CHG Soap as directed on instruction sheet.  Do not wear jewelry, make-up, hairpins, clips or nail polish.  Do not wear lotions, powders, or perfumes.   Do not shave body from the neck down 48 hours prior to surgery just in case you cut yourself which could leave a site for infection.  Also, freshly shaved skin may become irritated if using the CHG soap.  Contact lenses, hearing aids and dentures may not be worn into surgery.  Do not bring valuables to the hospital. [facility] is not responsible for any missing/lost belongings or valuables.   Notify your doctor if there is any change in your medical condition (cold, fever, infection).  Wear comfortable clothing (specific to your surgery type) to the hospital.  After surgery, you can help prevent lung complications by doing breathing exercises.  Take deep breaths and cough every 1-2 hours. Your doctor  may order a device called an Incentive Spirometer to help you take deep breaths.  If you are being admitted to the hospital overnight, leave your suitcase in the car. After surgery it may be brought to your room.  If you are being discharged the day of surgery, you will not be allowed to drive home. You will need a responsible adult (18 years or older) to drive you home and stay with you that night.   If you are taking public  transportation, you will need to have a responsible adult (18 years or older) with you. Please confirm with your physician that it is acceptable to use public transportation.   Please call the [place] [facility] Dept. at [phone] if you have any questions about these instructions.  Surgery Visitation Policy:  Patients undergoing a surgery or procedure may have two family members or support persons with them as long as the person is not COVID-19 positive or experiencing its symptoms.   Inpatient Visitation:    Visiting hours are 7 a.m. to 8 p.m. Up to four visitors are allowed at one time in a patient room. The visitors may rotate out with other people during the day. One designated support person (adult) may remain overnight.  Due to an increase in RSV and influenza rates and associated hospitalizations, children ages 71 and under will not be able to visit patients in [facility]. Masks continue to be strongly recommended.     Preparing for Surgery with CHLORHEXIDINE GLUCONATE (CHG) Soap  Chlorhexidine Gluconate (CHG) Soap  o An antiseptic cleaner that kills germs and bonds with the skin to continue killing germs even after washing  o Used for showering the night before surgery and morning of surgery  Before surgery, you can play an important role by reducing the number of germs on your skin.  CHG (Chlorhexidine gluconate) soap is an antiseptic cleanser which kills germs and bonds with the skin to continue killing germs even after washing.  Please do not use if you have an allergy to CHG or antibacterial soaps. If your skin becomes reddened/irritated stop using the CHG.  1. Shower the NIGHT BEFORE SURGERY and the MORNING OF SURGERY with CHG soap.  2. If you choose to wash your hair, wash your hair first as usual with your normal shampoo.  3. After shampooing, rinse your hair and body thoroughly to remove the shampoo.  4. Use CHG as you would any other liquid  soap. You can apply CHG directly to the skin and wash gently with a scrungie or a clean washcloth.  5. Apply the CHG soap to your body only from the neck down. Do not use on open wounds or open sores. Avoid contact with your eyes, ears, mouth, and genitals (private parts). Wash face and genitals (private parts) with your normal soap.  6. Wash thoroughly, paying special attention to the area where your surgery will be performed.  7. Thoroughly rinse your body with warm water.  8. Do not shower/wash with your normal soap after using and rinsing off the CHG soap.  9. Pat yourself dry with a clean towel.  10. Wear clean pajamas to bed the night before surgery.  12. Place clean sheets on your bed the night of your first shower and do not sleep with pets.  13. Shower again with the CHG soap on the day of surgery prior to arriving at the hospital.  14. Do not apply any deodorants/lotions/powders.  15. Please wear clean clothes  to the hospital.

## 2022-11-01 NOTE — Pre-Procedure Instructions (Signed)
Abnormal UA-Routed to Dr Nicholaus Bloom inbasket and informed him Honor Loh NP is not in the office this week so any abnormal labs/ua's surgeons will have to treat

## 2022-11-04 ENCOUNTER — Other Ambulatory Visit: Payer: Self-pay | Admitting: Family Medicine

## 2022-11-04 DIAGNOSIS — I10 Essential (primary) hypertension: Secondary | ICD-10-CM

## 2022-11-05 NOTE — Telephone Encounter (Signed)
Requested Prescriptions  Pending Prescriptions Disp Refills   losartan (COZAAR) 50 MG tablet [Pharmacy Med Name: LOSARTAN 50MG  TABLETS] 90 tablet 3    Sig: TAKE 1 TABLET(50 MG) BY MOUTH DAILY     Cardiovascular:  Angiotensin Receptor Blockers Failed - 11/04/2022  3:17 AM      Failed - Last BP in normal range    BP Readings from Last 1 Encounters:  11/01/22 (!) 147/78         Passed - Cr in normal range and within 180 days    Creat  Date Value Ref Range Status  08/14/2022 0.86 0.70 - 1.28 mg/dL Final   Creatinine, Ser  Date Value Ref Range Status  11/01/2022 0.78 0.61 - 1.24 mg/dL Final         Passed - K in normal range and within 180 days    Potassium  Date Value Ref Range Status  11/01/2022 3.8 3.5 - 5.1 mmol/L Final         Passed - Patient is not pregnant      Passed - Valid encounter within last 6 months    Recent Outpatient Visits           2 months ago Annual physical exam   [facility] Olin Hauser, DO   11 months ago Essential hypertension   [place], Devonne Doughty, DO   1 year ago Annual physical exam   [facility] Olin Hauser, DO   1 year ago Essential hypertension   [place], Devonne Doughty, DO   2 years ago Pure hypercholesterolemia   [place], DO       Future Appointments             In 3 months [place], Devonne Doughty, [facility], [facility]

## 2022-11-09 ENCOUNTER — Ambulatory Visit (INDEPENDENT_AMBULATORY_CARE_PROVIDER_SITE_OTHER): Payer: Medicare Other

## 2022-11-09 VITALS — Ht 76.0 in | Wt 240.0 lb

## 2022-11-09 DIAGNOSIS — Z0000 Encounter for general adult medical examination without abnormal findings: Secondary | ICD-10-CM

## 2022-11-09 NOTE — Progress Notes (Signed)
Virtual Visit via Telephone Note  I connected with  Vonna Drafts on 11/09/22 at 10:45 AM EST by telephone and verified that I am speaking with the correct person using two identifiers.  Location: Patient: home Provider: [facility] Persons participating in the virtual visit: [place]   I discussed the limitations, risks, security and privacy concerns of performing an evaluation and management service by telephone and the availability of in person appointments. The patient expressed understanding and agreed to proceed.  Interactive audio and video telecommunications were attempted between this nurse and patient, however failed, due to patient having technical difficulties OR patient did not have access to video capability.  We continued and completed visit with audio only.  Some vital signs may be absent or patient reported.   Dionisio David, LPN  Subjective:   Ricahrd Schwager is a 77 y.o. male who presents for Medicare Annual/Subsequent preventive examination.  Review of Systems     Cardiac Risk Factors include: advanced age (>63men, >56 women);male gender;smoking/ tobacco exposure;dyslipidemia     Objective:    Today's Vitals   11/09/22 1044 11/09/22 1058  Weight:  240 lb (108.9 kg)  Height:  6\' 4"  (1.93 m)  PainSc: 4     Body mass index is 29.21 kg/m.     11/09/2022   10:51 AM 11/01/2022    8:24 AM 12/19/2021    6:31 AM 12/05/2021   10:17 AM 10/13/2021   10:33 AM 09/27/2020    9:22 AM 09/22/2019    9:39 AM  Advanced Directives  Does Patient Have a Medical Advance Directive? No No No No No No No  Would patient like information on creating a medical advance directive? No - Patient declined  No - Patient declined  No - Patient declined  No - Patient declined    Current Medications (verified) Outpatient Encounter Medications as of 11/09/2022  Medication Sig   fluticasone (FLONASE) 50 MCG/ACT nasal spray SHAKE LIQUID AND USE 2 SPRAYS IN EACH NOSTRIL DAILY FOR  4 TO 6 WEEKS THEN STOP AND USE SEASONALLY OR AS NEEDED (Patient taking differently: Place 2 sprays into both nostrils daily as needed.)   ipratropium (ATROVENT) 0.06 % nasal spray Place 2 sprays into both nostrils 4 (four) times daily as needed for rhinitis.   losartan (COZAAR) 50 MG tablet TAKE 1 TABLET(50 MG) BY MOUTH DAILY   naproxen sodium (ALEVE) 220 MG tablet Take 440 mg by mouth 2 (two) times daily as needed.   rosuvastatin (CRESTOR) 10 MG tablet Take 10 mg by mouth once a week. Wednesday   amoxicillin (AMOXIL) 500 MG capsule Take 2,000 mg by mouth once. Prior to dental work (Patient not taking: Reported on 11/09/2022)   Melatonin 5 MG CAPS Take 5 mg by mouth at bedtime as needed (sleep). (Patient not taking: Reported on 11/09/2022)   No facility-administered encounter medications on file as of 11/09/2022.    Allergies (verified) Patient has no known allergies.   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   Procedure: TOTAL KNEE ARTHROPLASTY;  Surgeon: Christena Flake, MD;  Location: [facility] ORS;  Service: Orthopedics;  Laterality: Right;   Family History  Problem Relation Age of Onset   Alzheimer's disease Mother    Diabetes Father    Prostate cancer Brother    Social History   Socioeconomic History   Marital status: Widowed    Spouse name: Not on file   Number of children: 2   Years of education: Not on file   Highest education level: Not on file  Occupational History   Occupation: retired  Tobacco Use   Smoking status: Every Day     Packs/day: 0.50    Years: 57.00    Total pack years: 28.50    Types: Cigarettes   Smokeless tobacco: Never   Tobacco comments:    1 cigarette in the last month   Vaping Use   Vaping Use: Never used  Substance and Sexual Activity   Alcohol use: Yes    Alcohol/week: 2.0 standard drinks of alcohol    Types: 2 Glasses of wine per week   Drug use: Never   Sexual activity: Not on file  Other Topics Concern   Not on file  Social History Narrative   Lives alone   Social Determinants of Health   Financial Resource Strain: Low Risk  (11/09/2022)   Overall Financial Resource Strain (CARDIA)    Difficulty of Paying Living Expenses: Not hard at all  Food Insecurity: No Food Insecurity (11/09/2022)   Hunger Vital Sign    Worried About Running Out of Food in the Last Year: Never true    Ran Out of Food in the Last Year: Never true  Transportation Needs: No Transportation Needs (11/09/2022)   PRAPARE - Administrator, Civil Service (Medical): No    Lack of Transportation (Non-Medical): No  Physical Activity: Insufficiently Active (11/09/2022)   Exercise Vital Sign    Days of Exercise per Week: 2 days    Minutes of Exercise per Session: 20 min  Stress: No Stress Concern Present (11/09/2022)   [employer] of Occupational Health - Occupational Stress Questionnaire    Feeling of Stress : Not at all  Social Connections: Moderately Isolated (11/09/2022)   Social Connection and Isolation Panel [NHANES]    Frequency of Communication with Friends and Family: Three times a week    Frequency of Social Gatherings with Friends and Family: Twice a week    Attends Religious Services: 1 to 4 times per year    Active Member of [employer] or Organizations: No    Attends Banker Meetings: Never    Marital Status: Widowed    Tobacco Counseling Ready to quit: Not Answered Counseling given: Not Answered Tobacco comments: 1 cigarette in the last month    Clinical  Intake:  Pre-visit preparation completed: Yes  Pain : 0-10 Pain Score: 4  Pain Type: Chronic pain Pain Location: Knee Pain Orientation: Left     Nutritional Risks: None Diabetes: No  How often do you need to have someone help you when you read instructions, pamphlets, or other written materials from your doctor or pharmacy?: 1 - Never  Diabetic?no  Interpreter Needed?: No  Information entered by :: Kennedy Bucker, LPN   Activities of Daily Living    11/09/2022   10:52 AM 11/01/2022    8:26 AM  In your present state of health, do you have any difficulty performing  the following activities:  Hearing? 0   Vision? 0   Difficulty concentrating or making decisions? 0   Walking or climbing stairs? 1   Dressing or bathing? 0   Doing errands, shopping? 0 0  Preparing Food and eating ? N   Using the Toilet? N   In the past six months, have you accidently leaked urine? N   Do you have problems with loss of bowel control? N   Managing your Medications? N   Managing your Finances? N   Housekeeping or managing your Housekeeping? N     Patient Care Team: Smitty Cords, DO as PCP - General (Family Medicine)  Indicate any recent Medical Services you may have received from other than [facility] providers in the past year (date may be approximate).     Assessment:   This is a routine wellness examination for Anh.  Hearing/Vision screen Hearing Screening - Comments:: Wears aids Vision Screening - Comments:: Wears glasses- Dr.Woodard  Dietary issues and exercise activities discussed: Current Exercise Habits: Home exercise routine, Type of exercise: walking, Time (Minutes): 20, Frequency (Times/Week): 2, Weekly Exercise (Minutes/Week): 40, Intensity: Mild   Goals Addressed             This Visit's Progress    DIET - EAT MORE FRUITS AND VEGETABLES         Depression Screen    11/09/2022   10:48 AM 08/21/2022    9:55 AM 11/15/2021    9:19 AM 10/13/2021   10:31  AM 08/15/2021    1:28 PM 09/27/2020    9:24 AM 07/12/2020    9:02 AM  PHQ 2/9 Scores  PHQ - 2 Score 1 0 0 0 0 1 0  PHQ- 9 Score  0 0 0 0      Fall Risk    11/09/2022   10:52 AM 08/21/2022    9:55 AM 11/15/2021    9:19 AM 10/13/2021   10:34 AM 08/15/2021    1:28 PM  Fall Risk   Falls in the past year? 0 0 0 0 0  Number falls in past yr: 0 0 0 0 0  Injury with Fall? 0 0 0 0 0  Risk for fall due to : No Fall Risks No Fall Risks No Fall Risks No Fall Risks   Follow up Falls prevention discussed;Falls evaluation completed Falls evaluation completed Falls evaluation completed Falls prevention discussed Falls evaluation completed    FALL RISK PREVENTION PERTAINING TO THE HOME:  Any stairs in or around the home? No  If so, are there any without handrails? No  Home free of loose throw rugs in walkways, pet beds, electrical cords, etc? Yes  Adequate lighting in your home to reduce risk of falls? Yes   ASSISTIVE DEVICES UTILIZED TO PREVENT FALLS:  Life alert? No  Use of a cane, walker or w/c? No  Grab bars in the bathroom? No  Shower chair or bench in shower? No  Elevated toilet seat or a handicapped toilet? No    Cognitive Function:        11/09/2022   10:54 AM 09/27/2020    9:28 AM 09/22/2019    9:40 AM  6CIT Screen  What Year? 0 points 0 points 0 points  What month? 0 points 0 points 0 points  What time? 0 points 0 points 0 points  Count back from 20 0 points 0 points 0 points  Months in reverse 2 points 4 points 0 points  Repeat phrase  4 points 2 points 0 points  Total Score 6 points 6 points 0 points    Immunizations Immunization History  Administered Date(s) Administered   Fluad Quad(high Dose 65+) 07/03/2019, 07/12/2020, 08/15/2021, 08/22/2022   Influenza, High Dose Seasonal PF 10/24/2018   PFIZER(Purple Top)SARS-COV-2 Vaccination 12/30/2019, 01/20/2020    TDAP status: Patient wishes to receive this vaccine today after disclosing that insurance does not cover  the vaccine as a preventative vaccine.   Flu Vaccine status: Up to date  Pneumococcal vaccine status: Declined,  Education has been provided regarding the importance of this vaccine but patient still declined. Advised may receive this vaccine at local pharmacy or Health Dept. Aware to provide a copy of the vaccination record if obtained from local pharmacy or Health Dept. Verbalized acceptance and understanding.   Covid-19 vaccine status: Completed vaccines  Qualifies for Shingles Vaccine? Yes   Zostavax completed No   Shingrix Completed?: No.    Education has been provided regarding the importance of this vaccine. Patient has been advised to call insurance company to determine out of pocket expense if they have not yet received this vaccine. Advised may also receive vaccine at local pharmacy or Health Dept. Verbalized acceptance and understanding.  Screening Tests Health Maintenance  Topic Date Due   Pneumonia Vaccine 62+ Years old (1 - PCV) Never done   DTaP/Tdap/Td (1 - Tdap) Never done   Zoster Vaccines- Shingrix (1 of 2) Never done   COVID-19 Vaccine (3 - 2023-24 season) 06/29/2022   Lung Cancer Screening  03/15/2023   Medicare Annual Wellness (AWV)  11/10/2023   INFLUENZA VACCINE  Completed   Hepatitis C Screening  Completed   HPV VACCINES  Aged Out   Fecal DNA (Cologuard)  Discontinued    Health Maintenance  Health Maintenance Due  Topic Date Due   Pneumonia Vaccine 70+ Years old (1 - PCV) Never done   DTaP/Tdap/Td (1 - Tdap) Never done   Zoster Vaccines- Shingrix (1 of 2) Never done   COVID-19 Vaccine (3 - 2023-24 season) 06/29/2022    Colorectal cancer screening: Type of screening: Cologuard. Completed 08/22/20. Repeat every 3 years  Lung Cancer Screening: (Low Dose CT Chest recommended if Age 79-80 years, 30 pack-year currently smoking OR have quit w/in 15years.) does qualify.   Lung Cancer Screening Referral: ordered 03/16/22  Additional Screening:  Hepatitis C  Screening: does qualify; Completed 01/19/19  Vision Screening: Recommended annual ophthalmology exams for early detection of glaucoma and other disorders of the eye. Is the patient up to date with their annual eye exam?  Yes  Who is the provider or what is the name of the office in which the patient attends annual eye exams? Dr.Woodard If pt is not established with a provider, would they like to be referred to a provider to establish care? No .   Dental Screening: Recommended annual dental exams for proper oral hygiene  Community Resource Referral / Chronic Care Management: CRR required this visit?  No   CCM required this visit?  No      Plan:     I have personally reviewed and noted the following in the patient's chart:   Medical and social history Use of alcohol, tobacco or illicit drugs  Current medications and supplements including opioid prescriptions. Patient is not currently taking opioid prescriptions. Functional ability and status Nutritional status Physical activity Advanced directives List of other physicians Hospitalizations, surgeries, and ER visits in previous 12 months Vitals Screenings to include cognitive, depression, and  falls Referrals and appointments  In addition, I have reviewed and discussed with patient certain preventive protocols, quality metrics, and best practice recommendations. A written personalized care plan for preventive services as well as general preventive health recommendations were provided to patient.     Hal Hope, LPN   02/11/6062   Nurse Notes: none

## 2022-11-09 NOTE — Patient Instructions (Signed)
Jesse Gardner , Thank you for taking time to come for your Medicare Wellness Visit. I appreciate your ongoing commitment to your health goals. Please review the following plan we discussed and let me know if I can assist you in the future.   Screening recommendations/referrals: Colonoscopy: Cologuard 08/22/20 Recommended yearly ophthalmology/optometry visit for glaucoma screening and checkup Recommended yearly dental visit for hygiene and checkup  Vaccinations: Influenza vaccine: 08/22/22 Pneumococcal vaccine: n/d Tdap vaccine: n/d Shingles vaccine: n/d   Covid-19: 12/30/19, 01/20/20  Advanced directives: no  Conditions/risks identified: none  Next appointment: Follow up in one year for your annual wellness visit. 11/15/23 @ 10:45 am by phone  Preventive Care 65 Years and Older, Male Preventive care refers to lifestyle choices and visits with your health care provider that can promote health and wellness. What does preventive care include? A yearly physical exam. This is also called an annual well check. Dental exams once or twice a year. Routine eye exams. Ask your health care provider how often you should have your eyes checked. Personal lifestyle choices, including: Daily care of your teeth and gums. Regular physical activity. Eating a healthy diet. Avoiding tobacco and drug use. Limiting alcohol use. Practicing safe sex. Taking low doses of aspirin every day. Taking vitamin and mineral supplements as recommended by your health care provider. What happens during an annual well check? The services and screenings done by your health care provider during your annual well check will depend on your age, overall health, lifestyle risk factors, and family history of disease. Counseling  Your health care provider may ask you questions about your: Alcohol use. Tobacco use. Drug use. Emotional well-being. Home and relationship well-being. Sexual activity. Eating habits. History of  falls. Memory and ability to understand (cognition). Work and work Statistician. Screening  You may have the following tests or measurements: Height, weight, and BMI. Blood pressure. Lipid and cholesterol levels. These may be checked every 5 years, or more frequently if you are over 77 years old. Skin check. Lung cancer screening. You may have this screening every year starting at age 77 if you have a 30-pack-year history of smoking and currently smoke or have quit within the past 15 years. Fecal occult blood test (FOBT) of the stool. You may have this test every year starting at age 77. Flexible sigmoidoscopy or colonoscopy. You may have a sigmoidoscopy every 5 years or a colonoscopy every 10 years starting at age 77. Prostate cancer screening. Recommendations will vary depending on your family history and other risks. Hepatitis C blood test. Hepatitis B blood test. Sexually transmitted disease (STD) testing. Diabetes screening. This is done by checking your blood sugar (glucose) after you have not eaten for a while (fasting). You may have this done every 1-3 years. Abdominal aortic aneurysm (AAA) screening. You may need this if you are a current or former smoker. Osteoporosis. You may be screened starting at age 77 if you are at high risk. Talk with your health care provider about your test results, treatment options, and if necessary, the need for more tests. Vaccines  Your health care provider may recommend certain vaccines, such as: Influenza vaccine. This is recommended every year. Tetanus, diphtheria, and acellular pertussis (Tdap, Td) vaccine. You may need a Td booster every 10 years. Zoster vaccine. You may need this after age 77. Pneumococcal 13-valent conjugate (PCV13) vaccine. One dose is recommended after age 77. Pneumococcal polysaccharide (PPSV23) vaccine. One dose is recommended after age 77. Talk to your health care  provider about which screenings and vaccines you need and  how often you need them. This information is not intended to replace advice given to you by your health care provider. Make sure you discuss any questions you have with your health care provider. Document Released: 11/11/2015 Document Revised: 07/04/2016 Document Reviewed: 08/16/2015 Elsevier Interactive Patient Education  2017 [place] Prevention in the Home Falls can cause injuries. They can happen to people of all ages. There are many things you can do to make your home safe and to help prevent falls. What can I do on the outside of my home? Regularly fix the edges of walkways and driveways and fix any cracks. Remove anything that might make you trip as you walk through a door, such as a raised step or threshold. Trim any bushes or trees on the path to your home. Use bright outdoor lighting. Clear any walking paths of anything that might make someone trip, such as rocks or tools. Regularly check to see if handrails are loose or broken. Make sure that both sides of any steps have handrails. Any raised decks and porches should have guardrails on the edges. Have any leaves, snow, or ice cleared regularly. Use sand or salt on walking paths during winter. Clean up any spills in your garage right away. This includes oil or grease spills. What can I do in the bathroom? Use night lights. Install grab bars by the toilet and in the tub and shower. Do not use towel bars as grab bars. Use non-skid mats or decals in the tub or shower. If you need to sit down in the shower, use a plastic, non-slip stool. Keep the floor dry. Clean up any water that spills on the floor as soon as it happens. Remove soap buildup in the tub or shower regularly. Attach bath mats securely with double-sided non-slip rug tape. Do not have throw rugs and other things on the floor that can make you trip. What can I do in the bedroom? Use night lights. Make sure that you have a light by your bed that is easy to  reach. Do not use any sheets or blankets that are too big for your bed. They should not hang down onto the floor. Have a firm chair that has side arms. You can use this for support while you get dressed. Do not have throw rugs and other things on the floor that can make you trip. What can I do in the kitchen? Clean up any spills right away. Avoid walking on wet floors. Keep items that you use a lot in easy-to-reach places. If you need to reach something above you, use a strong step stool that has a grab bar. Keep electrical cords out of the way. Do not use floor polish or wax that makes floors slippery. If you must use wax, use non-skid floor wax. Do not have throw rugs and other things on the floor that can make you trip. What can I do with my stairs? Do not leave any items on the stairs. Make sure that there are handrails on both sides of the stairs and use them. Fix handrails that are broken or loose. Make sure that handrails are as long as the stairways. Check any carpeting to make sure that it is firmly attached to the stairs. Fix any carpet that is loose or worn. Avoid having throw rugs at the top or bottom of the stairs. If you do have throw rugs, attach them to the  floor with carpet tape. Make sure that you have a light switch at the top of the stairs and the bottom of the stairs. If you do not have them, ask someone to add them for you. What else can I do to help prevent falls? Wear shoes that: Do not have high heels. Have rubber bottoms. Are comfortable and fit you well. Are closed at the toe. Do not wear sandals. If you use a stepladder: Make sure that it is fully opened. Do not climb a closed stepladder. Make sure that both sides of the stepladder are locked into place. Ask someone to hold it for you, if possible. Clearly mark and make sure that you can see: Any grab bars or handrails. First and last steps. Where the edge of each step is. Use tools that help you move  around (mobility aids) if they are needed. These include: Canes. Walkers. Scooters. Crutches. Turn on the lights when you go into a dark area. Replace any light bulbs as soon as they burn out. Set up your furniture so you have a clear path. Avoid moving your furniture around. If any of your floors are uneven, fix them. If there are any pets around you, be aware of where they are. Review your medicines with your doctor. Some medicines can make you feel dizzy. This can increase your chance of falling. Ask your doctor what other things that you can do to help prevent falls. This information is not intended to replace advice given to you by your health care provider. Make sure you discuss any questions you have with your health care provider. Document Released: 08/11/2009 Document Revised: 03/22/2016 Document Reviewed: 11/19/2014 Elsevier Interactive Patient Education  2017 [employer].

## 2022-11-12 MED ORDER — CEFAZOLIN SODIUM-DEXTROSE 2-4 GM/100ML-% IV SOLN
2.0000 g | INTRAVENOUS | Status: AC
  Administered 2022-11-13: 2 g via INTRAVENOUS

## 2022-11-12 MED ORDER — ORAL CARE MOUTH RINSE: 15.0000 mL | Freq: Once | OROMUCOSAL | Status: AC

## 2022-11-12 MED ORDER — LACTATED RINGERS IV SOLN: INTRAVENOUS | Status: DC

## 2022-11-12 MED ORDER — FAMOTIDINE 20 MG PO TABS: 20.0000 mg | ORAL_TABLET | Freq: Once | ORAL | Status: AC

## 2022-11-12 MED ORDER — CHLORHEXIDINE GLUCONATE 0.12 % MT SOLN: 15.0000 mL | Freq: Once | OROMUCOSAL | Status: AC

## 2022-11-13 ENCOUNTER — Encounter: Payer: Self-pay | Admitting: Surgery

## 2022-11-13 ENCOUNTER — Encounter
Admission: RE | Disposition: A | Discharge status: Home / Self Care | Payer: Self-pay | Source: Home / Self Care | Attending: Surgery

## 2022-11-13 ENCOUNTER — Other Ambulatory Visit: Payer: Self-pay

## 2022-11-13 ENCOUNTER — Ambulatory Visit: Payer: Medicare Other | Admitting: Certified Registered"

## 2022-11-13 ENCOUNTER — Ambulatory Visit: Payer: Medicare Other

## 2022-11-13 ENCOUNTER — Ambulatory Visit
Admission: RE | Admit: 2022-11-13 | Discharge: 2022-11-13 | Disposition: A | Discharge status: Home / Self Care | Payer: Medicare Other | Attending: Surgery | Admitting: Surgery

## 2022-11-13 DIAGNOSIS — M1712 Unilateral primary osteoarthritis, left knee: Secondary | ICD-10-CM | POA: Diagnosis not present

## 2022-11-13 DIAGNOSIS — R2689 Other abnormalities of gait and mobility: Secondary | ICD-10-CM | POA: Diagnosis not present

## 2022-11-13 DIAGNOSIS — R7303 Prediabetes: Secondary | ICD-10-CM | POA: Insufficient documentation

## 2022-11-13 DIAGNOSIS — Z471 Aftercare following joint replacement surgery: Secondary | ICD-10-CM | POA: Diagnosis not present

## 2022-11-13 DIAGNOSIS — I10 Essential (primary) hypertension: Secondary | ICD-10-CM | POA: Insufficient documentation

## 2022-11-13 DIAGNOSIS — Z96652 Presence of left artificial knee joint: Secondary | ICD-10-CM | POA: Diagnosis not present

## 2022-11-13 DIAGNOSIS — J449 Chronic obstructive pulmonary disease, unspecified: Secondary | ICD-10-CM | POA: Insufficient documentation

## 2022-11-13 DIAGNOSIS — F17210 Nicotine dependence, cigarettes, uncomplicated: Secondary | ICD-10-CM | POA: Diagnosis not present

## 2022-11-13 DIAGNOSIS — E119 Type 2 diabetes mellitus without complications: Secondary | ICD-10-CM | POA: Diagnosis not present

## 2022-11-13 DIAGNOSIS — Z01812 Encounter for preprocedural laboratory examination: Secondary | ICD-10-CM

## 2022-11-13 DIAGNOSIS — J3089 Other allergic rhinitis: Secondary | ICD-10-CM

## 2022-11-13 HISTORY — PX: TOTAL KNEE ARTHROPLASTY: SHX125

## 2022-11-13 SURGERY — ARTHROPLASTY, KNEE, TOTAL
Anesthesia: Spinal | Site: Knee | Laterality: Left

## 2022-11-13 MED ORDER — BUPIVACAINE LIPOSOME 1.3 % IJ SUSP
INTRAMUSCULAR | Status: AC
  Filled 2022-11-13: qty 20

## 2022-11-13 MED ORDER — ONDANSETRON HCL 4 MG/2ML IJ SOLN: 4.0000 mg | Freq: Four times a day (QID) | INTRAMUSCULAR | Status: DC | PRN

## 2022-11-13 MED ORDER — OXYCODONE HCL 5 MG PO TABS: 5.0000 mg | ORAL_TABLET | ORAL | 0 refills | Status: DC | PRN

## 2022-11-13 MED ORDER — CHLORHEXIDINE GLUCONATE 0.12 % MT SOLN
OROMUCOSAL | Status: AC
  Administered 2022-11-13: 15 mL via OROMUCOSAL
  Filled 2022-11-13: qty 15

## 2022-11-13 MED ORDER — KETOROLAC TROMETHAMINE 15 MG/ML IJ SOLN
INTRAMUSCULAR | Status: AC
  Administered 2022-11-13: 15 mg via INTRAVENOUS
  Filled 2022-11-13: qty 1

## 2022-11-13 MED ORDER — CEFAZOLIN SODIUM-DEXTROSE 2-4 GM/100ML-% IV SOLN
INTRAVENOUS | Status: AC
  Filled 2022-11-13: qty 100

## 2022-11-13 MED ORDER — TRANEXAMIC ACID 1000 MG/10ML IV SOLN
INTRAVENOUS | Status: DC | PRN
  Administered 2022-11-13: 1000 mg via TOPICAL

## 2022-11-13 MED ORDER — SODIUM CHLORIDE 0.9 % IV SOLN: INTRAVENOUS | Status: DC

## 2022-11-13 MED ORDER — METOCLOPRAMIDE HCL 5 MG/ML IJ SOLN: 5.0000 mg | Freq: Three times a day (TID) | INTRAMUSCULAR | Status: DC | PRN

## 2022-11-13 MED ORDER — KETOROLAC TROMETHAMINE 30 MG/ML IJ SOLN
INTRAMUSCULAR | Status: AC
  Filled 2022-11-13: qty 1

## 2022-11-13 MED ORDER — METOPROLOL TARTRATE 5 MG/5ML IV SOLN
INTRAVENOUS | Status: AC
  Filled 2022-11-13: qty 5

## 2022-11-13 MED ORDER — BUPIVACAINE HCL (PF) 0.5 % IJ SOLN
INTRAMUSCULAR | Status: AC
  Filled 2022-11-13: qty 10

## 2022-11-13 MED ORDER — ACETAMINOPHEN 10 MG/ML IV SOLN
INTRAVENOUS | Status: DC | PRN
  Administered 2022-11-13: 1000 mg via INTRAVENOUS

## 2022-11-13 MED ORDER — CEFAZOLIN SODIUM-DEXTROSE 2-4 GM/100ML-% IV SOLN
INTRAVENOUS | Status: AC
  Administered 2022-11-13: 2 g via INTRAVENOUS
  Filled 2022-11-13: qty 100

## 2022-11-13 MED ORDER — FENTANYL CITRATE (PF) 100 MCG/2ML IJ SOLN: 25.0000 ug | INTRAMUSCULAR | Status: DC | PRN

## 2022-11-13 MED ORDER — FLUTICASONE PROPIONATE 50 MCG/ACT NA SUSP: 2.0000 | Freq: Every day | NASAL | Status: DC | PRN

## 2022-11-13 MED ORDER — METOCLOPRAMIDE HCL 10 MG PO TABS: 5.0000 mg | ORAL_TABLET | Freq: Three times a day (TID) | ORAL | Status: DC | PRN

## 2022-11-13 MED ORDER — TRANEXAMIC ACID 1000 MG/10ML IV SOLN
INTRAVENOUS | Status: AC
  Filled 2022-11-13: qty 10

## 2022-11-13 MED ORDER — SODIUM CHLORIDE FLUSH 0.9 % IV SOLN
INTRAVENOUS | Status: AC
  Filled 2022-11-13: qty 40

## 2022-11-13 MED ORDER — TRIAMCINOLONE ACETONIDE 40 MG/ML IJ SUSP
INTRAMUSCULAR | Status: AC
  Filled 2022-11-13: qty 1

## 2022-11-13 MED ORDER — KETOROLAC TROMETHAMINE 15 MG/ML IJ SOLN: 15.0000 mg | Freq: Once | INTRAMUSCULAR | Status: AC

## 2022-11-13 MED ORDER — PROPOFOL 1000 MG/100ML IV EMUL
INTRAVENOUS | Status: AC
  Filled 2022-11-13: qty 100

## 2022-11-13 MED ORDER — MIDAZOLAM HCL 2 MG/2ML IJ SOLN
INTRAMUSCULAR | Status: AC
  Filled 2022-11-13: qty 2

## 2022-11-13 MED ORDER — TRIAMCINOLONE ACETONIDE 40 MG/ML IJ SUSP
INTRAMUSCULAR | Status: DC | PRN
  Administered 2022-11-13: 92.5 mL

## 2022-11-13 MED ORDER — BUPIVACAINE-EPINEPHRINE (PF) 0.5% -1:200000 IJ SOLN
INTRAMUSCULAR | Status: AC
  Filled 2022-11-13: qty 30

## 2022-11-13 MED ORDER — FAMOTIDINE 20 MG PO TABS
ORAL_TABLET | ORAL | Status: AC
  Administered 2022-11-13: 20 mg via ORAL
  Filled 2022-11-13: qty 1

## 2022-11-13 MED ORDER — SODIUM CHLORIDE 0.9 % IR SOLN
Status: DC | PRN
  Administered 2022-11-13: 3000 mL

## 2022-11-13 MED ORDER — ONDANSETRON HCL 4 MG PO TABS: 4.0000 mg | ORAL_TABLET | Freq: Four times a day (QID) | ORAL | Status: DC | PRN

## 2022-11-13 MED ORDER — MIDAZOLAM HCL 5 MG/5ML IJ SOLN
INTRAMUSCULAR | Status: DC | PRN
  Administered 2022-11-13: 1 mg via INTRAVENOUS

## 2022-11-13 MED ORDER — METOPROLOL TARTRATE 5 MG/5ML IV SOLN
INTRAVENOUS | Status: DC | PRN
  Administered 2022-11-13: 2 mg via INTRAVENOUS
  Administered 2022-11-13 (×2): 1 mg via INTRAVENOUS

## 2022-11-13 MED ORDER — ACETAMINOPHEN 10 MG/ML IV SOLN
INTRAVENOUS | Status: AC
  Filled 2022-11-13: qty 100

## 2022-11-13 MED ORDER — PHENYLEPHRINE HCL (PRESSORS) 10 MG/ML IV SOLN
INTRAVENOUS | Status: AC
  Filled 2022-11-13: qty 1

## 2022-11-13 MED ORDER — CEFAZOLIN SODIUM-DEXTROSE 2-4 GM/100ML-% IV SOLN: 2.0000 g | Freq: Three times a day (TID) | INTRAVENOUS | Status: DC

## 2022-11-13 MED ORDER — SODIUM CHLORIDE 0.9 % BOLUS PEDS
250.0000 mL | Freq: Once | INTRAVENOUS | Status: AC
  Administered 2022-11-13: 250 mL via INTRAVENOUS

## 2022-11-13 MED ORDER — OXYCODONE HCL 5 MG PO TABS
5.0000 mg | ORAL_TABLET | ORAL | Status: DC | PRN
  Administered 2022-11-13: 5 mg via ORAL

## 2022-11-13 MED ORDER — OXYCODONE HCL 5 MG PO TABS
ORAL_TABLET | ORAL | Status: AC
  Filled 2022-11-13: qty 1

## 2022-11-13 MED ORDER — 0.9 % SODIUM CHLORIDE (POUR BTL) OPTIME
TOPICAL | Status: DC | PRN
  Administered 2022-11-13: 500 mL

## 2022-11-13 MED ORDER — APIXABAN 2.5 MG PO TABS: 2.5000 mg | ORAL_TABLET | Freq: Two times a day (BID) | ORAL | 0 refills | Status: DC

## 2022-11-13 MED ORDER — PROPOFOL 500 MG/50ML IV EMUL
INTRAVENOUS | Status: DC | PRN
  Administered 2022-11-13: 85 ug/kg/min via INTRAVENOUS

## 2022-11-13 MED ORDER — PHENYLEPHRINE HCL-NACL 20-0.9 MG/250ML-% IV SOLN
INTRAVENOUS | Status: DC | PRN
  Administered 2022-11-13: 25 ug/min via INTRAVENOUS

## 2022-11-13 SURGICAL SUPPLY — 57 items
APL PRP STRL LF DISP 70% ISPRP (MISCELLANEOUS) ×1
BIT DRILL QUICK REL 1/8 2PK SL (DRILL) IMPLANT
BLADE SAW SAG 25X90X1.19 (BLADE) ×1 IMPLANT
BLADE SURG SZ20 CARB STEEL (BLADE) ×1 IMPLANT
BNDG CMPR STD VLCR NS LF 5.8X6 (GAUZE/BANDAGES/DRESSINGS) ×1
BNDG ELASTIC 6X5.8 VLCR NS LF (GAUZE/BANDAGES/DRESSINGS) ×1 IMPLANT
BNDG ELASTIC 6X5.8 VLCR STR LF (GAUZE/BANDAGES/DRESSINGS) IMPLANT
BRNG TIB 83X14 ANT STAB MDLR (Insert) ×1 IMPLANT
CEMENT BONE R 1X40 (Cement) ×2 IMPLANT
CEMENT VACUUM MIXING SYSTEM (MISCELLANEOUS) ×1 IMPLANT
CHLORAPREP W/TINT 26 (MISCELLANEOUS) ×1 IMPLANT
COOLER POLAR GLACIER W/PUMP (MISCELLANEOUS) ×1 IMPLANT
DRAPE 3/4 80X56 (DRAPES) ×1 IMPLANT
DRAPE IMP U-DRAPE 54X76 (DRAPES) ×1 IMPLANT
DRAPE U-SHAPE 47X51 STRL (DRAPES) ×1 IMPLANT
DRILL QUICK RELEASE 1/8 INCH (DRILL) ×3
DRSG MEPILEX SACRM 8.7X9.8 (GAUZE/BANDAGES/DRESSINGS) IMPLANT
DRSG OPSITE POSTOP 4X10 (GAUZE/BANDAGES/DRESSINGS) ×1 IMPLANT
ELECT REM PT RETURN 9FT ADLT (ELECTROSURGICAL) ×1
ELECTRODE REM PT RTRN 9FT ADLT (ELECTROSURGICAL) ×1 IMPLANT
FEMORAL CR LEFT 75 (Joint) IMPLANT
GAUZE XEROFORM 1X8 LF (GAUZE/BANDAGES/DRESSINGS) ×1 IMPLANT
GLOVE BIO SURGEON STRL SZ7.5 (GLOVE) ×4 IMPLANT
GLOVE BIO SURGEON STRL SZ8 (GLOVE) ×4 IMPLANT
GLOVE BIOGEL PI IND STRL 8 (GLOVE) ×1 IMPLANT
GLOVE SURG UNDER LTX SZ8 (GLOVE) ×1 IMPLANT
GOWN STRL REUS W/ TWL LRG LVL3 (GOWN DISPOSABLE) ×1 IMPLANT
GOWN STRL REUS W/ TWL XL LVL3 (GOWN DISPOSABLE) ×1 IMPLANT
GOWN STRL REUS W/TWL LRG LVL3 (GOWN DISPOSABLE) ×1
GOWN STRL REUS W/TWL XL LVL3 (GOWN DISPOSABLE) ×1
HANDLE YANKAUER SUCT OPEN TIP (MISCELLANEOUS) ×1 IMPLANT
HOOD PEEL AWAY T7 (MISCELLANEOUS) ×3 IMPLANT
INSERT TIB BEARING 86X14 (Insert) IMPLANT
IV NS IRRIG 3000ML ARTHROMATIC (IV SOLUTION) ×1 IMPLANT
KIT TURNOVER KIT A (KITS) ×1 IMPLANT
MANIFOLD NEPTUNE II (INSTRUMENTS) ×1 IMPLANT
NDL SPNL 20GX3.5 QUINCKE YW (NEEDLE) ×1 IMPLANT
NEEDLE SPNL 20GX3.5 QUINCKE YW (NEEDLE) ×1 IMPLANT
NS IRRIG 1000ML POUR BTL (IV SOLUTION) ×1 IMPLANT
PACK TOTAL KNEE (MISCELLANEOUS) ×1 IMPLANT
PAD WRAPON POLAR KNEE (MISCELLANEOUS) ×1 IMPLANT
PEG PATELLA SERIES A 37MMX10MM (Orthopedic Implant) IMPLANT
PENCIL SMOKE EVACUATOR (MISCELLANEOUS) ×1 IMPLANT
PLATE TIBIAL INTERLOK 83 (Plate) IMPLANT
PULSAVAC PLUS IRRIG FAN TIP (DISPOSABLE) ×1
STAPLER SKIN PROX 35W (STAPLE) ×1 IMPLANT
SUCTION FRAZIER HANDLE 10FR (MISCELLANEOUS) ×1
SUCTION TUBE FRAZIER 10FR DISP (MISCELLANEOUS) ×1 IMPLANT
SUT VIC AB 0 CT1 36 (SUTURE) ×3 IMPLANT
SUT VIC AB 2-0 CT1 27 (SUTURE) ×3
SUT VIC AB 2-0 CT1 TAPERPNT 27 (SUTURE) ×3 IMPLANT
SYR 10ML LL (SYRINGE) ×1 IMPLANT
SYR 20ML LL LF (SYRINGE) ×1 IMPLANT
TIP FAN IRRIG PULSAVAC PLUS (DISPOSABLE) ×1 IMPLANT
TRAP FLUID SMOKE EVACUATOR (MISCELLANEOUS) ×2 IMPLANT
WATER STERILE IRR 500ML POUR (IV SOLUTION) ×1 IMPLANT
WRAPON POLAR PAD KNEE (MISCELLANEOUS) ×1

## 2022-11-13 NOTE — Transfer of Care (Signed)
Immediate Anesthesia Transfer of Care Note  Patient: Farouk Vivero  Procedure(s) Performed: TOTAL KNEE ARTHROPLASTY (Left: Knee)  Patient Location: PACU  Anesthesia Type:General  Level of Consciousness: awake, alert , and oriented  Airway & Oxygen Therapy: Patient Spontanous Breathing and Patient connected to face mask oxygen  Post-op Assessment: Report given to RN and Post -op Vital signs reviewed and stable  Post vital signs: Reviewed and stable  Last Vitals:  Vitals Value Taken Time  BP 127/82 11/13/22 1005  Temp    Pulse 113 11/13/22 1007  Resp 25 11/13/22 1006  SpO2 98 % 11/13/22 1007  Vitals shown include unvalidated device data.  Last Pain:  Vitals:   11/13/22 0631  TempSrc: Oral  PainSc: 0-No pain         Complications: No notable events documented.

## 2022-11-13 NOTE — Anesthesia Preprocedure Evaluation (Signed)
Anesthesia Evaluation  Patient identified by MRN, date of birth, ID band Patient awake    Reviewed: Allergy & Precautions, H&P , NPO status , Patient's Chart, lab work & pertinent test results, reviewed documented beta blocker date and time   History of Anesthesia Complications Negative for: history of anesthetic complications  Airway Mallampati: II  TM Distance: >3 FB Neck ROM: full    Dental  (+) Dental Advidsory Given, Poor Dentition, Missing, Edentulous Upper   Pulmonary shortness of breath and with exertion, asthma (as a child) , COPD,  COPD inhaler, neg recent URI, Current Smoker and Patient abstained from smoking.   Pulmonary exam normal breath sounds clear to auscultation       Cardiovascular Exercise Tolerance: Good hypertension, (-) angina (-) Past MI and (-) Cardiac Stents Normal cardiovascular exam(-) dysrhythmias (-) Valvular Problems/Murmurs Rhythm:regular Rate:Normal     Neuro/Psych negative neurological ROS  negative psych ROS   GI/Hepatic negative GI ROS, Neg liver ROS,,,  Endo/Other  diabetes (borderline)    Renal/GU negative Renal ROS  negative genitourinary   Musculoskeletal   Abdominal   Peds  Hematology negative hematology ROS (+)   Anesthesia Other Findings Past Medical History: No date: Arthritis No date: Asthma     Comment:  patient reports "as a child" No date: COPD (chronic obstructive pulmonary disease) (HCC) No date: Dyspnea No date: Hypertension No date: Pre-diabetes   Reproductive/Obstetrics negative OB ROS                              Anesthesia Physical Anesthesia Plan  ASA: 2  Anesthesia Plan: Spinal   Post-op Pain Management:    Induction:   PONV Risk Score and Plan: 0 and Propofol infusion and TIVA  Airway Management Planned: Natural Airway and Simple Face Mask  Additional Equipment:   Intra-op Plan:   Post-operative Plan:    Informed Consent: I have reviewed the patients History and Physical, chart, labs and discussed the procedure including the risks, benefits and alternatives for the proposed anesthesia with the patient or authorized representative who has indicated his/her understanding and acceptance.     Dental Advisory Given  Plan Discussed with: Anesthesiologist, CRNA and Surgeon  Anesthesia Plan Comments:          Anesthesia Quick Evaluation

## 2022-11-13 NOTE — H&P (Signed)
History of Present Illness: Jesse Gardner is a 77 y.o. male who presents today for his surgical history and physical for his upcoming scheduled left total knee arthroplasty. Surgery is scheduled with Dr. Roland Rack on 11/13/2022. The patient denies any changes in his medical history since he was last evaluated. He denies any numbness or tingling to the left lower extremity at today's visit. The patient reports a 5 out of 10 pain score in the left knee. The patient has a history of COPD, he does use routine nasal sprays but does not use any routine inhalers. He denies any personal history of heart attack, stroke. He is a borderline diabetic, most recent A1c was 6.3. He denies any personal history of blood clots. The patient denies any falls or injury affecting the left knee since he was last evaluated.  Past Medical History: Asthma  COPD (chronic obstructive pulmonary disease) (CMS-HCC)  Hypertension   Past Surgical History: Right TKA using all-cemented [employer] Vanguard system with a 75 mm PCR femur, an 83 mm tibial tray with a 16 mm anterior stabilized E-poly insert, and a 37 x 10 mm all-poly 3-pegged domed patella Right 12/19/2021 (Dr. Roland Rack)  [place]   Past Family History: Alzheimer's disease Mother  Diabetes Father  Prostate cancer Brother   Medications: acetaminophen (TYLENOL) 500 MG tablet Take 1,000 mg by mouth as needed for Pain  amoxicillin (AMOXIL) 500 MG capsule Take 4 tablets 1 hour prior to dental procedure. 4 capsule 3  fluticasone propionate (FLONASE) 50 mcg/actuation nasal spray USE 2 SPRAYS IN EACH NOSTRIL DAILY. USE FOR 4 TO 6 WEEKS, THEN STOP AND USE SEASONALLY OR AS NEEDED  glucosamine/chondr su A sod (OSTEO BI-FLEX ORAL) Take by mouth once daily  losartan (COZAAR) 50 MG tablet Take 1 tablet (50 mg total) by mouth once daily  melatonin 5 mg Cap Take by mouth Take 5 mg by mouth at bedtime as needed (sleep).  rosuvastatin (CRESTOR) 10 MG tablet Take 10 mg by  mouth once daily   Allergies: No Known Allergies   Review of Systems:  A comprehensive 14 point ROS was performed, reviewed by me today, and the pertinent orthopaedic findings are documented in the HPI.  Physical Exam: BP (!) 142/68  Ht 193 cm (6\' 4" )  Wt (!) 112.7 kg (248 lb 6.4 oz)  BMI 30.24 kg/m  General/Constitutional: The patient appears to be well-nourished, well-developed, and in no acute distress. Neuro/Psych: Normal mood and affect, oriented to person, place and time. Eyes: Non-icteric. Pupils are equal, round, and reactive to light, and exhibit synchronous movement. ENT: Unremarkable. Lymphatic: No palpable adenopathy. Respiratory: Lungs clear to auscultation, Normal chest excursion, No wheezes, and Non-labored breathing Cardiovascular: Regular rate and rhythm. No murmurs. and No edema, swelling or tenderness, except as noted in detailed exam. Integumentary: No impressive skin lesions present, except as noted in detailed exam. Musculoskeletal: Unremarkable, except as noted in detailed exam.  Left knee exam: ALIGNMENT: Moderate varus SKIN: Unremarkable SWELLING: Mild EFFUSION: At most a trace effusion WARMTH: None TENDERNESS: Mild tenderness along the medial joint line, but no tenderness along the lateral joint line ROM: 0-125 degrees with mild pain in maximal flexion McMURRAY'S: Equivocally positive PATELLOFEMORAL: Normal tracking with no peri-patellar tenderness and negative apprehension sign CREPITUS: Trace patellar crepitance LACHMAN'S: Negative PIVOT SHIFT: Negative ANTERIOR DRAWER: Negative POSTERIOR DRAWER: Negative VARUS/VALGUS: Mildly positive pseudolaxity to varus stressing  He remains neurovascularly intact to the left lower extremity and foot.  Imaging: Standing AP and lateral x-rays of  the left knee, as well as a sunrise view have been obtained in the past and reviewed today. These films demonstrate advanced degenerative joint disease, primarily  involving the medial compartment, with 100% loss of the medial clear space. Lesser degenerative changes of the patellofemoral more so than the lateral compartment also are noted. The knee is aligned in moderate varus. No lytic lesions or acute bony abnormalities are identified.   Impression: 1. Primary osteoarthritis of left knee.  Plan:  1. Treatment options were discussed today with the patient. 2. The patient is scheduled for a left total knee arthroplasty with Dr. Roland Rack on 11/13/2022. 3. The patient was instructed on the risk and benefits of surgery and wishes to proceed at this time. 4. This document will serve as the surgical history and physical for the patient. 5. The patient will follow-up per standard postop protocol. They can call the clinic they have any questions, new symptoms develop or symptoms worsen.  The procedure was discussed with the patient, as were the potential risks (including bleeding, infection, nerve and/or blood vessel injury, persistent or recurrent pain, failure of the hardware, stiffness, instability, need for further surgery, blood clots, strokes, heart attacks and/or arhythmias, pneumonia, etc.) and benefits. The patient states his understanding and wishes to proceed.    H&P reviewed and patient re-examined. No changes.

## 2022-11-13 NOTE — Evaluation (Signed)
Physical Therapy Evaluation Patient Details Name: Jesse Gardner MRN: [id number] DOB: [birth year] Today's Date: 11/13/2022  History of Present Illness  Pt is a 77 y.o. male s/p L TKA on 11/13/22 expected for SDDC.  Clinical Impression  Pt admitted with above diagnosis. Pt received upright in bed agreeable to PT. Daughter at bedside assisting in home lay out, PLOF, etc as pt is very HoH. At baseline pt is independent with all aspects of mobility and ADL's. Reports getting daughter's assistance at d/c. Hopeful for SDDC.   To date, pt educated on WB precautions LLE positioning to prevent contractures. Pt with decreased sensation to LT at LLE, inability to DF at ankle but performing SLR without quad lag. Pt exits bed mod-I standing at RW minguard and VC's for hand placement. Pt ambulating ~ 200' in post-op area requiring VC's for increased LLE hip flexion for foot clearance as pt without ankle DF on LLE. Per RN, Dr. Joice Lofts aware and RN states he says it is still due to pt's anesthesia from operation.  Pt progressing from minguard to supervision level with performance of stair completion with sideways technique and single rail use with safe completion and correct LE sequencing. Pt returned to recliner with review of HEP packet with education on reps/sets/frequency with good understanding of therex. Reviewed car transfer with pt and daughter reporting no further needs or questions. Pt safe for SDDC. Will plan for [facility] PT follow up recs to progress pt mobility. Pt currently with functional limitations due to the deficits listed below (see PT Problem List). Pt will benefit from skilled PT to increase their independence and safety with mobility to allow discharge to the venue listed below.           Recommendations for follow up therapy are one component of a multi-disciplinary discharge planning process, led by the attending physician.  Recommendations may be updated based on patient status, additional functional  criteria and insurance authorization.  Follow Up Recommendations Home health PT      Assistance Recommended at Discharge Intermittent Supervision/Assistance  Patient can return home with the following  A little help with walking and/or transfers;A little help with bathing/dressing/bathroom;Assistance with cooking/housework;Assist for transportation;Help with stairs or ramp for entrance    Equipment Recommendations None recommended by PT  Recommendations for Other Services       Functional Status Assessment Patient has had a recent decline in their functional status and demonstrates the ability to make significant improvements in function in a reasonable and predictable amount of time.     Precautions / Restrictions Precautions Precautions: Knee Precaution Booklet Issued: Yes (comment) Restrictions Weight Bearing Restrictions: Yes LLE Weight Bearing: Weight bearing as tolerated      Mobility  Bed Mobility Overal bed mobility: Modified Independent               Patient Response: Cooperative  Transfers Overall transfer level: Needs assistance Equipment used: Rolling walker (2 wheels) Transfers: Sit to/from Stand Sit to Stand: Supervision           General transfer comment: VC's for hand placement    Ambulation/Gait Ambulation/Gait assistance: Min guard, Supervision Gait Distance (Feet): 200 Feet Assistive device: Rolling walker (2 wheels) Gait Pattern/deviations: Step-through pattern, Decreased dorsiflexion - left       General Gait Details: Pt able to perform step through gait. Reliant on increased L hip flexion to clear foot in swing phase as pt has no active ankle DF RN reporting due to anesthesia.  Stairs Stairs: Yes  Stairs assistance: Min guard Stair Management: One rail Left, Sideways Number of Stairs: 4 General stair comments: PT demo prior to performance. Safe completion with correct LE sequencing.  Wheelchair Mobility    Modified Rankin  (Stroke Patients Only)       Balance Overall balance assessment: Needs assistance Sitting-balance support: No upper extremity supported, Feet supported Sitting balance-Leahy Scale: Good     Standing balance support: Bilateral upper extremity supported, During functional activity Standing balance-Leahy Scale: Fair Standing balance comment: use of RW                             Pertinent Vitals/Pain Pain Assessment Pain Assessment: 0-10 Pain Score: 7  Pain Descriptors / Indicators: Aching, Discomfort, Grimacing Pain Intervention(s): Limited activity within patient's tolerance, Monitored during session, Ice applied, Repositioned    Home Living Family/patient expects to be discharged to:: Private residence Living Arrangements: Alone Available Help at Discharge: Family (Daughter) Type of Home: House Home Access: Stairs to enter Entrance Stairs-Rails: Left Entrance Stairs-Number of Steps: 4   Home Layout: One level Home Equipment: Conservation officer, nature (2 wheels);BSC/3in1;Cane - single point      Prior Function Prior Level of Function : Independent/Modified Independent                     Hand Dominance        Extremity/Trunk Assessment   Upper Extremity Assessment Upper Extremity Assessment: Overall WFL for tasks assessed    Lower Extremity Assessment Lower Extremity Assessment: LLE deficits/detail LLE Deficits / Details: L foot drop attributed to anesthesia from surgery; L TKA LLE Sensation: decreased light touch       Communication   Communication: No difficulties  Cognition Arousal/Alertness: Awake/alert Behavior During Therapy: WFL for tasks assessed/performed Overall Cognitive Status: Within Functional Limits for tasks assessed                                          General Comments      Exercises Total Joint Exercises Ankle Circles/Pumps: AROM, Strengthening, Both, 10 reps, Supine Quad Sets: AROM, Supine,  Strengthening, Left, 10 reps Short Arc Quad: AROM, Supine, Strengthening, Left, 10 reps Heel Slides: AROM, Supine, Strengthening, Left, 10 reps Hip ABduction/ADduction: AROM, Supine, Strengthening, Left, 10 reps Straight Leg Raises: AROM, Supine, Strengthening, Left, 10 reps Long Arc Quad: AROM, Seated, Strengthening, Left, 10 reps Other Exercises Other Exercises: Role of PT in acute setting, d/c recs, WB precautions, L knee positioning to prevent joint contracture, car transfer, HEP review (reps/sets/frequency).   Assessment/Plan    PT Assessment Patient needs continued PT services  PT Problem List Decreased range of motion;Pain;Decreased strength;Impaired sensation;Decreased activity tolerance;Decreased balance;Decreased mobility       PT Treatment Interventions DME instruction;Therapeutic exercise;Balance training;Stair training;Gait training;Neuromuscular re-education;Functional mobility training;Patient/family education;Therapeutic activities    PT Goals (Current goals can be found in the Care Plan section)  Acute Rehab PT Goals Patient Stated Goal: go home PT Goal Formulation: With patient/family Time For Goal Achievement: 11/27/22 Potential to Achieve Goals: Good    Frequency BID     Co-evaluation               AM-PAC PT "6 Clicks" Mobility  Outcome Measure Help needed turning from your back to your side while in a flat bed without using bedrails?: None Help needed moving from  lying on your back to sitting on the side of a flat bed without using bedrails?: None Help needed moving to and from a bed to a chair (including a wheelchair)?: A Little Help needed standing up from a chair using your arms (e.g., wheelchair or bedside chair)?: A Little Help needed to walk in hospital room?: A Little Help needed climbing 3-5 steps with a railing? : A Little 6 Click Score: 20    End of Session Equipment Utilized During Treatment: Gait belt Activity Tolerance: Patient  tolerated treatment well Patient left: in chair;with call bell/phone within reach;with nursing/sitter in room;with family/visitor present Nurse Communication: Mobility status PT Visit Diagnosis: Other abnormalities of gait and mobility (R26.89);Muscle weakness (generalized) (M62.81);Pain Pain - Right/Left: Left Pain - part of body: Knee    Time: 0867-6195 PT Time Calculation (min) (ACUTE ONLY): 24 min   Charges:   PT Evaluation $PT Eval Low Complexity: 1 Low PT Treatments $Therapeutic Exercise: 8-22 mins       Adonia Porada M. Fairly IV, PT, DPT Physical Therapist- [facility]  11/13/2022, 3:17 PM

## 2022-11-13 NOTE — Discharge Instructions (Addendum)
Orthopedic discharge instructions: May shower with intact OpSite dressing. Apply ice frequently to knee or use Polar Care. Start Eliquis 1 tablet (2.5 mg) twice daily on Wednesday, 11/13/2022, for 2 weeks, then take aspirin 325 mg twice daily for 4 weeks. Take pain medication as prescribed when needed.  May supplement with ES Tylenol if necessary. May weight-bear as tolerated on right leg - use walker for balance and support. Follow-up in 10-14 days or as scheduled.   AMBULATORY SURGERY  DISCHARGE INSTRUCTIONS   The drugs that you were given will stay in your system until tomorrow so for the next 24 hours you should not:  Drive an automobile Make any legal decisions Drink any alcoholic beverage   You may resume regular meals tomorrow.  Today it is better to start with liquids and gradually work up to solid foods.  You may eat anything you prefer, but it is better to start with liquids, then soup and crackers, and gradually work up to solid foods.   Please notify your doctor immediately if you have any unusual bleeding, trouble breathing, redness and pain at the surgery site, drainage, fever, or pain not relieved by medication.    Additional Instructions:   PLEASE LEAVE GREEN ARMBAND ON FOR 4 DAYS     Please contact your physician with any problems or Same Day Surgery at [phone], Monday through Friday 6 am to 4 pm, or [place] [facility] number at [phone].  POLAR CARE INFORMATION  [email or web address]  How to use [place] Cold Therapy System?  YouTube   [email or web address]  OPERATING INSTRUCTIONS  Start the product With dry hands, connect the transformer to the electrical connection located on the top of the cooler. Next, plug the transformer into an appropriate electrical outlet. The unit will automatically start running at this point.  To stop the pump, disconnect electrical power.  Unplug to stop the product when  not in use. Unplugging the Polar Care unit turns it off. Always unplug immediately after use. Never leave it plugged in while unattended. Remove pad.    FIRST ADD WATER TO FILL LINE, THEN ICE---Replace ice when existing ice is almost melted  1 Discuss Treatment with your [place] Practitioner and Use Only as Prescribed 2 Apply Insulation Barrier & Cold Therapy Pad 3 Check for Moisture 4 Inspect Skin Regularly  Tips and Trouble Shooting Usage Tips 1. Use cubed or chunked ice for optimal performance. 2. It is recommended to drain the Pad between uses. To drain the pad, hold the Pad upright with the hose pointed toward the ground. Depress the black plunger and allow water to drain out. 3. You may disconnect the Pad from the unit without removing the pad from the affected area by depressing the silver tabs on the hose coupling and gently pulling the hoses apart. The Pad and unit will seal itself and will not leak. Note: Some dripping during release is normal. 4. DO NOT RUN PUMP WITHOUT WATER! The pump in this unit is designed to run with water. Running the unit without water will cause permanent damage to the pump. 5. Unplug unit before removing lid.  TROUBLESHOOTING GUIDE Pump not running, Water not flowing to the pad, Pad is not getting cold 1. Make sure the transformer is plugged into the wall outlet. 2. Confirm that the ice and water are filled to the indicated levels. 3. Make sure there are no kinks in the pad. 4. Gently pull on the blue tube to  make sure the tube/pad junction is straight. 5. Remove the pad from the treatment site and ll it while the pad is lying at; then reapply. 6. Confirm that the pad couplings are securely attached to the unit. Listen for the double clicks (Figure 1) to confirm the pad couplings are securely attached.  Leaks    Note: Some condensation on the lines, controller, and pads is unavoidable, especially in warmer climates. 1. If using a [place] Polar  Care Cold Therapy unit with a detachable Cold Therapy Pad, and a leak exists (other than condensation on the lines) disconnect the pad couplings. Make sure the silver tabs on the couplings are depressed before reconnecting the pad to the pump hose; then confirm both sides of the coupling are properly clicked in. 2. If the coupling continues to leak or a leak is detected in the pad itself, stop using it and call [place] at [phone].  Cleaning After use, empty and dry the unit with a soft cloth. Warm water and mild detergent may be used occasionally to clean the pump and tubes.  WARNING: The [place] can be cold enough to cause serious injury, including full skin necrosis. Follow these Operating Instructions, and carefully read the Product Insert (see pouch on side of unit) and the Cold Therapy Pad Fitting Instructions (provided with each Cold Therapy Pad) prior to use.

## 2022-11-13 NOTE — Progress Notes (Signed)
Patient awake/alert x4. Moving bil lower ext, able to bend, push RLE, LLE able lift off bed, able to push, < then RLE. Pulses intact +2.  Denies discomfort. Tolerated po fluids/crackers without event. Received #1 fluid bolus as ordered.  PT to see patient prior to discharge.

## 2022-11-13 NOTE — Op Note (Signed)
11/13/2022  10:12 AM  Patient:   Jesse Gardner  Pre-Op Diagnosis:   Degenerative joint disease, left knee.  Post-Op Diagnosis:   Same  Procedure:   Left TKA using all-cemented [employer] Vanguard system with a 75 mm PCR femur, an  83  mm tibial tray with a 14 mm anterior stabilized E-poly insert, and a 37 x 10 mm all-poly 3-pegged domed patella.  Surgeon:   Pascal Lux, MD  Assistant:   Cameron Proud, PA-C   Anesthesia:   Spinal  Findings:   As above  Complications:   None  EBL:   15 cc  Fluids:   1000 cc crystalloid  UOP:   None  TT:   100 minutes at 300 mmHg  Drains:   None  Closure:   Staples  Implants:   As above  Brief Clinical Note:   The patient is a 77 year old male with a long history of progressively worsening left knee pain. The patient's symptoms have progressed despite medications, activity modification, injections, etc. The patient's history and examination were consistent with advanced degenerative joint disease of the left knee confirmed by plain radiographs. The patient presents at this time for a left total knee arthroplasty.  Procedure:   The patient was brought into the operating room. After adequate spinal anesthesia was obtained, the patient was lain in the supine position before the left lower extremity was prepped with ChloraPrep solution and draped sterilely. Preoperative antibiotics were administered. A timeout was performed to verify the appropriate surgical site before the limb was exsanguinated with an Esmarch and the tourniquet inflated to 300 mmHg.   A standard anterior approach to the knee was made through an approximately 7 inch incision. The incision was carried down through the subcutaneous tissues to expose superficial retinaculum. This was split the length of the incision and the medial flap elevated sufficiently to expose the medial retinaculum. The medial retinaculum was incised, leaving a 3-4 mm cuff of tissue on the patella. This was  extended distally along the medial border of the patellar tendon and proximally through the medial third of the quadriceps tendon. A subtotal fat pad excision was performed before the soft tissues were elevated off the anteromedial and anterolateral aspects of the proximal tibia to the level of the collateral ligaments. The anterior portions of the medial and lateral menisci were removed, as was the anterior cruciate ligament. With the knee flexed to 90, the external tibial guide was positioned and the appropriate proximal tibial cut made. This piece was taken to the back table where it was measured and found to be optimally replicated by an  83  mm component.  Attention was directed to the distal femur. The intramedullary canal was accessed through a 3/8" drill hole. The intramedullary guide was inserted and positioned in order to obtain a neutral flexion gap. The intercondylar block was positioned with care taken to avoid notching the anterior cortex of the femur. The appropriate cut was made. Next, the distal cutting block was placed at 5 of valgus alignment. Using the 9 mm slot, the distal cut was made. The distal femur was measured and found to be optimally replicated by the 75 mm component. The 75 mm 4-in-1 cutting block was positioned and first the posterior, then the posterior chamfer, the anterior chamfer, and finally the anterior cuts were made. At this point, the posterior portions medial and lateral menisci were removed. A trial reduction was performed using the appropriate femoral and tibial components with first the  10 mm, then the 12 mm, and finally the 14 mm insert. The 14 mm insert demonstrated excellent stability to varus and valgus stressing both in flexion and extension while permitting full extension. Patella tracking was assessed and found to be excellent. Therefore, the tibial guide position was marked on the proximal tibia. The patella thickness was measured and found to be 27 mm.  Therefore, the appropriate cut was made. The patellar surface was measured and found to be optimally replicated by the 37 mm component. The three peg holes were drilled in place before the trial button was inserted. Patella tracking was assessed and found to be slightly tight.  Therefore, a limited lateral lease was performed.  Subsequent patellar tracking was assessed and found to be excellent, passing the "no thumb test". The lug holes were drilled into the distal femur before the trial component was removed, leaving only the tibial tray. The keel was then created using the appropriate tower, reamer, and punch.  The bony surfaces were prepared for cementing by irrigating them thoroughly with sterile saline solution via the jet lavage system. A bone plug was fashioned from some of the bone that had been removed previously and used to plug the distal femoral canal. In addition, 20 cc of Exparel diluted out to 60 cc with normal saline and 30 cc of 0.5% Sensorcaine were injected into the postero-medial and postero-lateral aspects of the knee, the medial and lateral gutter regions, and the peri-incisional tissues to help with postoperative analgesia. Meanwhile, the cement was being mixed on the back table. When it was ready, the tibial tray was cemented in first. The excess cement was removed using [employer] fast streamer. Next, the femoral component was impacted into place. Again, the excess cement was removed using [employer] fast streamer. The 14 mm trial insert was positioned and the knee brought into extension while the cement hardened. Finally, the patella was cemented into place and secured using the patellar clamp. Again, the excess cement was removed using [employer] fast streamer. Once the cement had hardened, the knee was placed through a range of motion with the findings as described above. Therefore, the trial insert was removed and, after verifying that no cement had been retained posteriorly, the permanent 14 mm anterior  stabilized E-polyethylene insert was positioned and secured using the appropriate key locking mechanism. Again the knee was placed through a range of motion with the findings as described above.  The wound was copiously irrigated with sterile saline solution using the jet lavage system before the quadriceps tendon and retinacular layer were reapproximated using #0 Vicryl interrupted sutures. The superficial retinacular layer also was closed using a running #0 Vicryl suture. A total of 10 cc of transexemic acid (TXA) was injected intra-articularly before the subcutaneous tissues were closed in several layers using 2-0 Vicryl interrupted sutures. The skin was closed using staples. A sterile honeycomb dressing was applied to the skin before the leg was wrapped with an Ace wrap to accommodate the Polar Care device. The patient was then awakened and returned to the recovery room in satisfactory condition after tolerating the procedure well.

## 2022-11-15 DIAGNOSIS — R7303 Prediabetes: Secondary | ICD-10-CM | POA: Diagnosis not present

## 2022-11-15 DIAGNOSIS — I10 Essential (primary) hypertension: Secondary | ICD-10-CM | POA: Diagnosis not present

## 2022-11-15 DIAGNOSIS — I700 Atherosclerosis of aorta: Secondary | ICD-10-CM | POA: Diagnosis not present

## 2022-11-15 DIAGNOSIS — Z96651 Presence of right artificial knee joint: Secondary | ICD-10-CM | POA: Diagnosis not present

## 2022-11-15 DIAGNOSIS — Z7951 Long term (current) use of inhaled steroids: Secondary | ICD-10-CM | POA: Diagnosis not present

## 2022-11-15 DIAGNOSIS — K644 Residual hemorrhoidal skin tags: Secondary | ICD-10-CM | POA: Diagnosis not present

## 2022-11-15 DIAGNOSIS — J4489 Other specified chronic obstructive pulmonary disease: Secondary | ICD-10-CM | POA: Diagnosis not present

## 2022-11-15 DIAGNOSIS — Z471 Aftercare following joint replacement surgery: Secondary | ICD-10-CM | POA: Diagnosis not present

## 2022-11-15 DIAGNOSIS — N138 Other obstructive and reflux uropathy: Secondary | ICD-10-CM | POA: Diagnosis not present

## 2022-11-15 DIAGNOSIS — E7800 Pure hypercholesterolemia, unspecified: Secondary | ICD-10-CM | POA: Diagnosis not present

## 2022-11-15 DIAGNOSIS — Z7901 Long term (current) use of anticoagulants: Secondary | ICD-10-CM | POA: Diagnosis not present

## 2022-11-15 DIAGNOSIS — Z9181 History of falling: Secondary | ICD-10-CM | POA: Diagnosis not present

## 2022-11-15 DIAGNOSIS — Z96652 Presence of left artificial knee joint: Secondary | ICD-10-CM | POA: Diagnosis not present

## 2022-11-15 DIAGNOSIS — J432 Centrilobular emphysema: Secondary | ICD-10-CM | POA: Diagnosis not present

## 2022-11-15 NOTE — Anesthesia Postprocedure Evaluation (Signed)
Anesthesia Post Note  Patient: Jesse Gardner  Procedure(s) Performed: TOTAL KNEE ARTHROPLASTY (Left: Knee)  Patient location during evaluation: PACU Anesthesia Type: Spinal Level of consciousness: awake and alert Pain management: pain level controlled Vital Signs Assessment: post-procedure vital signs reviewed and stable Respiratory status: spontaneous breathing and respiratory function stable Cardiovascular status: blood pressure returned to baseline and stable Postop Assessment: spinal receding Anesthetic complications: no   No notable events documented.   Last Vitals:  Vitals:   11/13/22 1424 11/13/22 1515  BP: 130/78 139/66  Pulse: 93   Resp: 20 15  Temp:    SpO2: 96% 100%    Last Pain:  Vitals:   11/13/22 1515  TempSrc:   PainSc: 0-No pain                 Martha Clan

## 2022-11-17 DIAGNOSIS — Z9181 History of falling: Secondary | ICD-10-CM | POA: Diagnosis not present

## 2022-11-17 DIAGNOSIS — K644 Residual hemorrhoidal skin tags: Secondary | ICD-10-CM | POA: Diagnosis not present

## 2022-11-17 DIAGNOSIS — Z7901 Long term (current) use of anticoagulants: Secondary | ICD-10-CM | POA: Diagnosis not present

## 2022-11-17 DIAGNOSIS — R7303 Prediabetes: Secondary | ICD-10-CM | POA: Diagnosis not present

## 2022-11-17 DIAGNOSIS — E7800 Pure hypercholesterolemia, unspecified: Secondary | ICD-10-CM | POA: Diagnosis not present

## 2022-11-17 DIAGNOSIS — Z7951 Long term (current) use of inhaled steroids: Secondary | ICD-10-CM | POA: Diagnosis not present

## 2022-11-17 DIAGNOSIS — I10 Essential (primary) hypertension: Secondary | ICD-10-CM | POA: Diagnosis not present

## 2022-11-17 DIAGNOSIS — Z96651 Presence of right artificial knee joint: Secondary | ICD-10-CM | POA: Diagnosis not present

## 2022-11-17 DIAGNOSIS — I700 Atherosclerosis of aorta: Secondary | ICD-10-CM | POA: Diagnosis not present

## 2022-11-17 DIAGNOSIS — J4489 Other specified chronic obstructive pulmonary disease: Secondary | ICD-10-CM | POA: Diagnosis not present

## 2022-11-17 DIAGNOSIS — N138 Other obstructive and reflux uropathy: Secondary | ICD-10-CM | POA: Diagnosis not present

## 2022-11-17 DIAGNOSIS — Z471 Aftercare following joint replacement surgery: Secondary | ICD-10-CM | POA: Diagnosis not present

## 2022-11-17 DIAGNOSIS — J432 Centrilobular emphysema: Secondary | ICD-10-CM | POA: Diagnosis not present

## 2022-11-17 DIAGNOSIS — Z96652 Presence of left artificial knee joint: Secondary | ICD-10-CM | POA: Diagnosis not present

## 2022-11-19 DIAGNOSIS — E7800 Pure hypercholesterolemia, unspecified: Secondary | ICD-10-CM | POA: Diagnosis not present

## 2022-11-19 DIAGNOSIS — J4489 Other specified chronic obstructive pulmonary disease: Secondary | ICD-10-CM | POA: Diagnosis not present

## 2022-11-19 DIAGNOSIS — K644 Residual hemorrhoidal skin tags: Secondary | ICD-10-CM | POA: Diagnosis not present

## 2022-11-19 DIAGNOSIS — Z96652 Presence of left artificial knee joint: Secondary | ICD-10-CM | POA: Diagnosis not present

## 2022-11-19 DIAGNOSIS — Z7951 Long term (current) use of inhaled steroids: Secondary | ICD-10-CM | POA: Diagnosis not present

## 2022-11-19 DIAGNOSIS — Z7901 Long term (current) use of anticoagulants: Secondary | ICD-10-CM | POA: Diagnosis not present

## 2022-11-19 DIAGNOSIS — Z9181 History of falling: Secondary | ICD-10-CM | POA: Diagnosis not present

## 2022-11-19 DIAGNOSIS — Z471 Aftercare following joint replacement surgery: Secondary | ICD-10-CM | POA: Diagnosis not present

## 2022-11-19 DIAGNOSIS — I10 Essential (primary) hypertension: Secondary | ICD-10-CM | POA: Diagnosis not present

## 2022-11-19 DIAGNOSIS — J432 Centrilobular emphysema: Secondary | ICD-10-CM | POA: Diagnosis not present

## 2022-11-19 DIAGNOSIS — R7303 Prediabetes: Secondary | ICD-10-CM | POA: Diagnosis not present

## 2022-11-19 DIAGNOSIS — N138 Other obstructive and reflux uropathy: Secondary | ICD-10-CM | POA: Diagnosis not present

## 2022-11-19 DIAGNOSIS — Z96651 Presence of right artificial knee joint: Secondary | ICD-10-CM | POA: Diagnosis not present

## 2022-11-19 DIAGNOSIS — I700 Atherosclerosis of aorta: Secondary | ICD-10-CM | POA: Diagnosis not present

## 2022-11-21 DIAGNOSIS — Z7951 Long term (current) use of inhaled steroids: Secondary | ICD-10-CM | POA: Diagnosis not present

## 2022-11-21 DIAGNOSIS — J4489 Other specified chronic obstructive pulmonary disease: Secondary | ICD-10-CM | POA: Diagnosis not present

## 2022-11-21 DIAGNOSIS — R7303 Prediabetes: Secondary | ICD-10-CM | POA: Diagnosis not present

## 2022-11-21 DIAGNOSIS — E7800 Pure hypercholesterolemia, unspecified: Secondary | ICD-10-CM | POA: Diagnosis not present

## 2022-11-21 DIAGNOSIS — Z9181 History of falling: Secondary | ICD-10-CM | POA: Diagnosis not present

## 2022-11-21 DIAGNOSIS — Z96652 Presence of left artificial knee joint: Secondary | ICD-10-CM | POA: Diagnosis not present

## 2022-11-21 DIAGNOSIS — Z471 Aftercare following joint replacement surgery: Secondary | ICD-10-CM | POA: Diagnosis not present

## 2022-11-21 DIAGNOSIS — J432 Centrilobular emphysema: Secondary | ICD-10-CM | POA: Diagnosis not present

## 2022-11-21 DIAGNOSIS — Z7901 Long term (current) use of anticoagulants: Secondary | ICD-10-CM | POA: Diagnosis not present

## 2022-11-21 DIAGNOSIS — I700 Atherosclerosis of aorta: Secondary | ICD-10-CM | POA: Diagnosis not present

## 2022-11-21 DIAGNOSIS — N138 Other obstructive and reflux uropathy: Secondary | ICD-10-CM | POA: Diagnosis not present

## 2022-11-21 DIAGNOSIS — I10 Essential (primary) hypertension: Secondary | ICD-10-CM | POA: Diagnosis not present

## 2022-11-21 DIAGNOSIS — Z96651 Presence of right artificial knee joint: Secondary | ICD-10-CM | POA: Diagnosis not present

## 2022-11-21 DIAGNOSIS — K644 Residual hemorrhoidal skin tags: Secondary | ICD-10-CM | POA: Diagnosis not present

## 2022-11-22 DIAGNOSIS — Z9181 History of falling: Secondary | ICD-10-CM | POA: Diagnosis not present

## 2022-11-22 DIAGNOSIS — I700 Atherosclerosis of aorta: Secondary | ICD-10-CM | POA: Diagnosis not present

## 2022-11-22 DIAGNOSIS — E7800 Pure hypercholesterolemia, unspecified: Secondary | ICD-10-CM | POA: Diagnosis not present

## 2022-11-22 DIAGNOSIS — J432 Centrilobular emphysema: Secondary | ICD-10-CM | POA: Diagnosis not present

## 2022-11-22 DIAGNOSIS — I10 Essential (primary) hypertension: Secondary | ICD-10-CM | POA: Diagnosis not present

## 2022-11-22 DIAGNOSIS — N138 Other obstructive and reflux uropathy: Secondary | ICD-10-CM | POA: Diagnosis not present

## 2022-11-22 DIAGNOSIS — K644 Residual hemorrhoidal skin tags: Secondary | ICD-10-CM | POA: Diagnosis not present

## 2022-11-22 DIAGNOSIS — R7303 Prediabetes: Secondary | ICD-10-CM | POA: Diagnosis not present

## 2022-11-22 DIAGNOSIS — Z96651 Presence of right artificial knee joint: Secondary | ICD-10-CM | POA: Diagnosis not present

## 2022-11-22 DIAGNOSIS — Z7951 Long term (current) use of inhaled steroids: Secondary | ICD-10-CM | POA: Diagnosis not present

## 2022-11-22 DIAGNOSIS — Z96652 Presence of left artificial knee joint: Secondary | ICD-10-CM | POA: Diagnosis not present

## 2022-11-22 DIAGNOSIS — J4489 Other specified chronic obstructive pulmonary disease: Secondary | ICD-10-CM | POA: Diagnosis not present

## 2022-11-22 DIAGNOSIS — Z7901 Long term (current) use of anticoagulants: Secondary | ICD-10-CM | POA: Diagnosis not present

## 2022-11-22 DIAGNOSIS — Z471 Aftercare following joint replacement surgery: Secondary | ICD-10-CM | POA: Diagnosis not present

## 2022-11-26 DIAGNOSIS — J4489 Other specified chronic obstructive pulmonary disease: Secondary | ICD-10-CM | POA: Diagnosis not present

## 2022-11-26 DIAGNOSIS — N138 Other obstructive and reflux uropathy: Secondary | ICD-10-CM | POA: Diagnosis not present

## 2022-11-26 DIAGNOSIS — Z471 Aftercare following joint replacement surgery: Secondary | ICD-10-CM | POA: Diagnosis not present

## 2022-11-26 DIAGNOSIS — I700 Atherosclerosis of aorta: Secondary | ICD-10-CM | POA: Diagnosis not present

## 2022-11-26 DIAGNOSIS — Z96652 Presence of left artificial knee joint: Secondary | ICD-10-CM | POA: Diagnosis not present

## 2022-11-26 DIAGNOSIS — Z96651 Presence of right artificial knee joint: Secondary | ICD-10-CM | POA: Diagnosis not present

## 2022-11-26 DIAGNOSIS — E7800 Pure hypercholesterolemia, unspecified: Secondary | ICD-10-CM | POA: Diagnosis not present

## 2022-11-26 DIAGNOSIS — J432 Centrilobular emphysema: Secondary | ICD-10-CM | POA: Diagnosis not present

## 2022-11-26 DIAGNOSIS — Z9181 History of falling: Secondary | ICD-10-CM | POA: Diagnosis not present

## 2022-11-26 DIAGNOSIS — I10 Essential (primary) hypertension: Secondary | ICD-10-CM | POA: Diagnosis not present

## 2022-11-26 DIAGNOSIS — K644 Residual hemorrhoidal skin tags: Secondary | ICD-10-CM | POA: Diagnosis not present

## 2022-11-26 DIAGNOSIS — R7303 Prediabetes: Secondary | ICD-10-CM | POA: Diagnosis not present

## 2022-11-26 DIAGNOSIS — Z7951 Long term (current) use of inhaled steroids: Secondary | ICD-10-CM | POA: Diagnosis not present

## 2022-11-26 DIAGNOSIS — Z7901 Long term (current) use of anticoagulants: Secondary | ICD-10-CM | POA: Diagnosis not present

## 2022-11-28 DIAGNOSIS — M25562 Pain in left knee: Secondary | ICD-10-CM | POA: Diagnosis not present

## 2022-11-28 DIAGNOSIS — Z96652 Presence of left artificial knee joint: Secondary | ICD-10-CM | POA: Diagnosis not present

## 2022-11-30 DIAGNOSIS — Z96652 Presence of left artificial knee joint: Secondary | ICD-10-CM | POA: Diagnosis not present

## 2022-11-30 DIAGNOSIS — M25562 Pain in left knee: Secondary | ICD-10-CM | POA: Diagnosis not present

## 2022-11-30 DIAGNOSIS — Z471 Aftercare following joint replacement surgery: Secondary | ICD-10-CM | POA: Diagnosis not present

## 2022-12-05 DIAGNOSIS — Z96652 Presence of left artificial knee joint: Secondary | ICD-10-CM | POA: Diagnosis not present

## 2022-12-05 NOTE — Progress Notes (Unsigned)
12/06/2022 9:45 PM   Jesse Gardner [birth year] [id number]  Referring provider: Olin Hauser, DO [address] [place],  [place] [address]  Urological history: 1. 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  No evidence of hydronephrosis or other acute findings. -RUS (03/2022) - Scarring inferior pole. Note is made of a 2 cm cyst within the midpole of the left kidney -cysto (05/2020) - NED -no reports of gross heme -UA (10/2022) negative for micro heme  2. BPH with LU TS -PSA (07/2022) 3.28 -I PSS *** -PVR ***  No chief complaint on file.   HPI: Jesse Gardner is a 77 y.o. male who presents today for difficulty with urination.   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    Surgical History: Past Surgical History:  Procedure Laterality Date   APPENDECTOMY     COLONOSCOPY     HERNIA REPAIR Right    inguinal   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   Procedure: TOTAL KNEE ARTHROPLASTY;  Surgeon: Corky Mull, MD;  Location: [facility] ORS;  Service: Orthopedics;  Laterality: Right;   TOTAL KNEE ARTHROPLASTY Left 11/13/2022   Procedure:  TOTAL KNEE ARTHROPLASTY;  Surgeon: Corky Mull, MD;  Location: [facility] ORS;  Service: Orthopedics;  Laterality: Left;    Home Medications:  Allergies as of 12/06/2022   No Known Allergies      Medication List        Accurate as of December 05, 2022  9:45 PM. If you have any questions, ask your nurse or doctor.          apixaban 2.5 MG Tabs tablet Commonly known as: Eliquis Take 1 tablet (2.5 mg total) by mouth 2 (two) times daily.   fluticasone 50 MCG/ACT nasal spray Commonly known as: FLONASE Place 2 sprays into both nostrils daily as needed.   ipratropium 0.06 % nasal spray Commonly known as: ATROVENT Place 2 sprays into both nostrils 4 (four) times daily as needed for rhinitis.   losartan 50 MG tablet Commonly known as: COZAAR TAKE 1 TABLET(50 MG) BY MOUTH DAILY   Melatonin 5 MG Caps Take 5 mg by mouth at bedtime as needed (sleep).   oxyCODONE 5 MG immediate release tablet Commonly known as: Roxicodone Take 1-2 tablets (5-10 mg total) by mouth every 4 (four) hours as needed for moderate pain or severe pain.   rosuvastatin 10 MG tablet Commonly known as: CRESTOR Take 10 mg by mouth once a week. Wednesday   sulfamethoxazole-trimethoprim 800-160 MG tablet Commonly known as: BACTRIM DS Take 1  tablet by mouth 2 (two) times daily.        Allergies: No Known Allergies  Family History: Family History  Problem Relation Age of Onset   Alzheimer's disease Mother    Diabetes Father    Prostate cancer Brother     Social History:  reports that he has been smoking cigarettes. He has a 28.50 pack-year smoking history. He has never used smokeless tobacco. He reports current alcohol use of about 2.0 standard drinks of alcohol per week. He reports that he does not use drugs.  ROS: Pertinent ROS in HPI  Physical Exam: There were no vitals taken for this visit.  Constitutional:  Well nourished. Alert and oriented, No acute distress. HEENT: [place] AT, moist mucus  membranes.  Trachea midline, no masses. Cardiovascular: No clubbing, cyanosis, or edema. Respiratory: Normal respiratory effort, no increased work of breathing. GI: Abdomen is soft, non tender, non distended, no abdominal masses. Liver and spleen not palpable.  No hernias appreciated.  Stool sample for occult testing is not indicated.   GU: No CVA tenderness.  No bladder fullness or masses.  Patient with circumcised/uncircumcised phallus. ***Foreskin easily retracted***  Urethral meatus is patent.  No penile discharge. No penile lesions or rashes. Scrotum without lesions, cysts, rashes and/or edema.  Testicles are located scrotally bilaterally. No masses are appreciated in the testicles. Left and right epididymis are normal. Rectal: Patient with  normal sphincter tone. Anus and perineum without scarring or rashes. No rectal masses are appreciated. Prostate is approximately *** grams, *** nodules are appreciated. Seminal vesicles are normal. Skin: No rashes, bruises or suspicious lesions. Lymph: No cervical or inguinal adenopathy. Neurologic: Grossly intact, no focal deficits, moving all 4 extremities. Psychiatric: Normal mood and affect.  Laboratory Data: Lab Results  Component Value Date   WBC 9.5 11/01/2022   HGB 14.3 11/01/2022   HCT 44.6 11/01/2022   MCV 89.0 11/01/2022   PLT 240 11/01/2022    Lab Results  Component Value Date   CREATININE 0.78 11/01/2022    Lab Results  Component Value Date   PSA 3.28 08/14/2022   PSA 2.07 11/08/2021   PSA 3.93 08/08/2021    No results found for: "TESTOSTERONE"  Lab Results  Component Value Date   HGBA1C 6.3 (H) 08/14/2022       Component Value Date/Time   CHOL 194 08/14/2022 0813   HDL 37 (L) 08/14/2022 0813   CHOLHDL 5.2 (H) 08/14/2022 0813   LDLCALC 134 (H) 08/14/2022 0813    Lab Results  Component Value Date   AST 21 11/01/2022   Lab Results  Component Value Date   ALT 21 11/01/2022    Urinalysis ***   I have  reviewed the labs.   Pertinent Imaging: ***      Assessment & Plan:    1. Difficulty w/ urination -UA *** -PVR ***  No follow-ups on file.  These notes generated with voice recognition software. I apologize for typographical errors.  [place], [place] [address]  [place], [place] [address] [phone]

## 2022-12-06 ENCOUNTER — Ambulatory Visit: Payer: Medicare Other | Admitting: Urology

## 2022-12-06 ENCOUNTER — Encounter: Payer: Self-pay | Admitting: Urology

## 2022-12-06 VITALS — BP 105/61 | HR 102

## 2022-12-06 DIAGNOSIS — R39198 Other difficulties with micturition: Secondary | ICD-10-CM

## 2022-12-06 DIAGNOSIS — N401 Enlarged prostate with lower urinary tract symptoms: Secondary | ICD-10-CM | POA: Diagnosis not present

## 2022-12-06 DIAGNOSIS — N453 Epididymo-orchitis: Secondary | ICD-10-CM | POA: Diagnosis not present

## 2022-12-06 LAB — BLADDER SCAN AMB NON-IMAGING: Scan Result: 51

## 2022-12-06 MED ORDER — SULFAMETHOXAZOLE-TRIMETHOPRIM 800-160 MG PO TABS: 1.0000 | ORAL_TABLET | Freq: Two times a day (BID) | ORAL | 0 refills | Status: DC

## 2022-12-07 DIAGNOSIS — M25562 Pain in left knee: Secondary | ICD-10-CM | POA: Diagnosis not present

## 2022-12-07 DIAGNOSIS — Z96652 Presence of left artificial knee joint: Secondary | ICD-10-CM | POA: Diagnosis not present

## 2022-12-07 LAB — URINALYSIS, COMPLETE
Bilirubin, UA: NEGATIVE
Glucose, UA: NEGATIVE
Ketones, UA: NEGATIVE
Nitrite, UA: NEGATIVE
Protein,UA: NEGATIVE
RBC, UA: NEGATIVE
Specific Gravity, UA: <1.005 — ABNORMAL LOW (ref 1.005–1.030)
Urobilinogen, Ur: 1 mg/dL (ref 0.2–1.0)
pH, UA: 5.5 (ref 5.0–7.5)

## 2022-12-07 LAB — MICROSCOPIC EXAMINATION

## 2022-12-07 NOTE — Progress Notes (Unsigned)
12/10/2022 8:47 PM   Vonna Drafts [birth year] [id number]  Referring provider: Olin Hauser, DO [address] [place],  [place] [address]  Urological history: 1. 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  No evidence of hydronephrosis or other acute findings. -RUS (03/2022) - Scarring inferior pole. Note is made of a 2 cm cyst within the midpole of the left kidney -cysto (05/2020) - NED -no reports of gross heme -UA (10/2022) negative for micro heme  2. BPH with LU TS -PSA (07/2022) 3.28 -I PSS 8/4 -PVR 51 mL   No chief complaint on file.   HPI: Melquan Venables is a 77 y.o. male who presents today for left testicular swelling not for difficulty with urination as the appointment note states with his daughter, Janace Hoard.    At his visit on 12/06/2022, his left testicle is swollen for two weeks.  He had knee surgery on 11/13/2022 and his pre-op appointment urinalysis positive was positive, so his orthopedist put him on five days of antibiotics.  I PSS 8/4  PVR 51 mL  UA yellow clear, specific gravity <1.005, pH 5.5, 1+ leukocytes, 11-30 WBC's, 0-2 RBC's, 0-10 epithelial cells and many bacteria.   Urine culture pending. ***   Started on Septra DS.      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    Surgical History: Past Surgical History:  Procedure Laterality Date   APPENDECTOMY     COLONOSCOPY     HERNIA REPAIR Right    inguinal   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   Procedure: TOTAL KNEE ARTHROPLASTY;  Surgeon: Corky Mull, MD;  Location: [facility] ORS;  Service: Orthopedics;  Laterality: Right;   TOTAL KNEE ARTHROPLASTY Left 11/13/2022   Procedure: TOTAL KNEE ARTHROPLASTY;  Surgeon: Corky Mull, MD;  Location: [facility] ORS;  Service: Orthopedics;  Laterality: Left;    Home Medications:  Allergies as of 12/10/2022   No Known Allergies      Medication List        Accurate as of December 07, 2022  8:47 PM. If you have any questions, ask your nurse or doctor.          aspirin 325 MG tablet Take 325 mg by mouth daily.   fluticasone 50 MCG/ACT nasal spray Commonly known as: FLONASE Place 2 sprays into both nostrils daily as needed.   ipratropium 0.06 % nasal spray Commonly known as: ATROVENT Place 2 sprays into both nostrils 4 (four) times daily as needed for rhinitis.   losartan 50 MG tablet Commonly known as: COZAAR TAKE 1 TABLET(50 MG) BY MOUTH DAILY   Melatonin 5 MG Caps Take 5 mg by mouth at bedtime  as needed (sleep).   oxyCODONE 5 MG immediate release tablet Commonly known as: Roxicodone Take 1-2 tablets (5-10 mg total) by mouth every 4 (four) hours as needed for moderate pain or severe pain.   rosuvastatin 10 MG tablet Commonly known as: CRESTOR Take 10 mg by mouth once a week. Wednesday   sulfamethoxazole-trimethoprim 800-160 MG tablet Commonly known as: BACTRIM DS Take 1 tablet by mouth every 12 (twelve) hours.        Allergies: No Known Allergies  Family History: Family History  Problem Relation Age of Onset   Alzheimer's disease Mother    Diabetes Father    Prostate cancer Brother     Social History:  reports that he has been smoking cigarettes. He has a 28.50  pack-year smoking history. He has never used smokeless tobacco. He reports current alcohol use of about 2.0 standard drinks of alcohol per week. He reports that he does not use drugs.  ROS: Pertinent ROS in HPI  Physical Exam: There were no vitals taken for this visit.  Constitutional:  Well nourished. Alert and oriented, No acute distress. HEENT: [place] AT, moist mucus membranes.  Trachea midline Cardiovascular: No clubbing, cyanosis, or edema. Respiratory: Normal respiratory effort, no increased work of breathing. GU: No CVA tenderness.  No bladder fullness or masses.  Patient with circumcised/uncircumcised phallus. ***Foreskin easily retracted***  Urethral meatus is patent.  No penile discharge. No penile lesions or rashes. Scrotum without lesions, cysts, rashes and/or edema.  Testicles are located scrotally bilaterally. No masses are appreciated in the testicles. Left and right epididymis are normal. Rectal: Patient with  normal sphincter tone. Anus and perineum without scarring or rashes. No rectal masses are appreciated. Prostate is approximately *** grams, *** nodules are appreciated. Seminal vesicles are normal. Neurologic: Grossly intact, no focal deficits, moving all 4 extremities. Psychiatric: Normal mood and affect.   Laboratory Data: ***   Pertinent Imaging: N/A     Assessment & Plan:    1. Left orchitis/epididymitis -***  2. BPH with LU TS -will reassess once orchitis has cleared     No follow-ups on file.  These notes generated with voice recognition software. I apologize for typographical errors.  [place], [place] [address]  [place], [place] [address] [phone]

## 2022-12-10 ENCOUNTER — Encounter: Payer: Self-pay | Admitting: Urology

## 2022-12-10 ENCOUNTER — Ambulatory Visit: Payer: Medicare Other | Admitting: Urology

## 2022-12-10 VITALS — BP 108/57 | HR 105 | Ht 76.0 in | Wt 238.8 lb

## 2022-12-10 DIAGNOSIS — N453 Epididymo-orchitis: Secondary | ICD-10-CM

## 2022-12-11 ENCOUNTER — Telehealth: Payer: Self-pay | Admitting: Family Medicine

## 2022-12-11 LAB — CULTURE, URINE COMPREHENSIVE

## 2022-12-11 NOTE — Telephone Encounter (Signed)
Patient notified and will call back to schedule 1 month appointment.

## 2022-12-11 NOTE — Telephone Encounter (Signed)
-----   Message from Nori Riis, PA-C sent at 12/11/2022  1:43 PM EST ----- Please let Mr. Poupard know that his urine culture has returned and he is on the appropriate antibiotic.  I also wanted to see him back in 1 month.

## 2022-12-12 DIAGNOSIS — Z96652 Presence of left artificial knee joint: Secondary | ICD-10-CM | POA: Diagnosis not present

## 2022-12-12 DIAGNOSIS — M25562 Pain in left knee: Secondary | ICD-10-CM | POA: Diagnosis not present

## 2022-12-14 DIAGNOSIS — Z96652 Presence of left artificial knee joint: Secondary | ICD-10-CM | POA: Diagnosis not present

## 2022-12-20 DIAGNOSIS — M25562 Pain in left knee: Secondary | ICD-10-CM | POA: Diagnosis not present

## 2022-12-20 DIAGNOSIS — Z96652 Presence of left artificial knee joint: Secondary | ICD-10-CM | POA: Diagnosis not present

## 2022-12-24 DIAGNOSIS — M25562 Pain in left knee: Secondary | ICD-10-CM | POA: Diagnosis not present

## 2022-12-24 DIAGNOSIS — Z96652 Presence of left artificial knee joint: Secondary | ICD-10-CM | POA: Diagnosis not present

## 2022-12-24 DIAGNOSIS — M1712 Unilateral primary osteoarthritis, left knee: Secondary | ICD-10-CM | POA: Diagnosis not present

## 2022-12-28 DIAGNOSIS — Z96652 Presence of left artificial knee joint: Secondary | ICD-10-CM | POA: Diagnosis not present

## 2023-01-01 DIAGNOSIS — Z96652 Presence of left artificial knee joint: Secondary | ICD-10-CM | POA: Diagnosis not present

## 2023-01-01 DIAGNOSIS — M25562 Pain in left knee: Secondary | ICD-10-CM | POA: Diagnosis not present

## 2023-01-03 DIAGNOSIS — Z96652 Presence of left artificial knee joint: Secondary | ICD-10-CM | POA: Diagnosis not present

## 2023-01-03 DIAGNOSIS — M25562 Pain in left knee: Secondary | ICD-10-CM | POA: Diagnosis not present

## 2023-01-04 NOTE — Progress Notes (Unsigned)
12/10/2022 11:01 AM   Jesse Gardner 05-23-1946 [id number]  Referring provider: Olin Hauser, DO [address] [place],  [place] [address]  Urological history: 1. 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  No evidence of hydronephrosis or other acute findings. -RUS (03/2022) - Scarring inferior pole. Note is made of a 2 cm cyst within the midpole of the left kidney -cysto (05/2020) - NED -no reports of gross heme -UA (10/2022) negative for micro heme  2. BPH with LU TS -PSA (07/2022) 3.28 -I PSS 8/4 -PVR 51 mL   Chief Complaint  Patient presents with   Follow-up    HPI: Jesse Gardner is a 77 y.o. male who presents today for recheck on left orchitis/epididymitis.  At his visit on 12/06/2022, his left testicle is swollen for two weeks.  He had knee surgery on 11/13/2022 and his pre-op appointment urinalysis positive was positive, so his orthopedist put him on five days of antibiotics.  I PSS 8/4  PVR 51 mL  UA yellow clear, specific gravity <1.005, pH 5.5, 1+ leukocytes, 11-30 WBC's, 0-2 RBC's, 0-10 epithelial cells and many bacteria.   Urine culture pending.   Started on Septra DS.    At his visit on 12/11/2022, he has been taking the medication as prescribed and has not had any untoward side effects.  He feels that the pain and swelling is decreasing.   He still had a reactive hydrocele some tenderness on exam at that visit.  He was advised to continue his antibiotic and return in 1 month.  Today, he  feels that the right-sided pain has diminished to the point of almost nonexistent.  He does sometimes become aware if he should bump it.  The swelling still persists, but is not painful.   Patient denies any modifying or aggravating factors.  Patient denies any gross hematuria, dysuria or suprapubic/flank pain.  Patient denies any fevers, chills, nausea or vomiting.    He did mention that he is having difficulty with erectile dysfunction.  He states that his last episode of sexual intercourse was probably 5 years ago and was not satisfying to him.  He has difficulty getting and maintaining erections.  He is currently not dating and not interested in starting to date at this time.  He is not having spontaneous erections, but he denied any pain or curvature with erections.  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 If you have any questions, ask your nurse or doctor.          aspirin 325 MG tablet Take 325 mg by mouth daily.   fluticasone 50 MCG/ACT nasal spray Commonly known as:  FLONASE Place 2 sprays into both nostrils daily as needed.   ipratropium 0.06 % nasal spray Commonly known as: ATROVENT Place 2 sprays into both nostrils 4 (four) times daily as needed for rhinitis.   losartan 50 MG tablet Commonly known as: COZAAR TAKE 1 TABLET(50 MG) BY MOUTH DAILY   Melatonin 5 MG Caps Take 5 mg by mouth at bedtime as needed (sleep).   oxyCODONE 5 MG immediate release tablet Commonly known as: Roxicodone Take 1-2 tablets (5-10 mg total) by mouth every 4 (four) hours as needed for moderate pain or severe pain.   rosuvastatin 10 MG tablet Commonly known as: CRESTOR Take 10 mg by mouth once a week. Wednesday   sulfamethoxazole-trimethoprim 800-160 MG tablet Commonly known as: BACTRIM DS Take 1 tablet by mouth every 12 (twelve) hours.        Allergies: No Known Allergies  Family History: Family History  Problem Relation Age of Onset   Alzheimer's disease Mother    Diabetes Father    Prostate cancer Brother     Social History:  reports that he has been smoking cigarettes. He has a 28.50 pack-year smoking history. He has never used smokeless tobacco. He reports current alcohol use of about 2.0 standard drinks of alcohol per week. He reports that he does not use drugs.  ROS: Pertinent ROS in HPI  Physical Exam: BP (!) 108/57 (BP Location: Left Arm, Patient Position: Sitting, Cuff Size: Normal)   Pulse (!) 105   Ht '6\' 4"'$  (1.93 m)   Wt 238 lb 12.8 oz (108.3 kg)   SpO2 95%   BMI 29.07 kg/m   Constitutional:  Well nourished. Alert and oriented, No acute distress. HEENT: [place] AT, moist mucus membranes.  Trachea midline Cardiovascular: No clubbing, cyanosis, or edema. Respiratory: Normal respiratory effort, no increased work of breathing. GU: Scrotum without lesions, cysts, rashes and/or edema.  Testicles are located scrotally bilaterally. No masses are appreciated in the testicles. Left and right epididymis are normal.  Left hydrocele still present, but  it is non-tender, no erythema, no fluctuance and no crepitus noticed Neurologic: Grossly intact, no focal deficits, moving all 4 extremities. Psychiatric: Normal mood and affect.   Laboratory Data: N/A  Pertinent Imaging: N/A     Assessment & Plan:    1. Left orchitis/epididymitis -Resolved -Advised that the hydrocele may or may not resolve -Reviewed return precautions  2. BPH with LU TS -He has no issues with urination at this time  3. ED -We talked about trying a PDE 5 inhibitor, but he states he does not have a partner right now and would like to table the issue  Return in about 1 month (around 01/08/2023) for Symptom recheck and exam recheck .  These notes generated with voice recognition software. I apologize for  typographical errors.  [place], [place] [address]  [place], [place] [address] [phone]

## 2023-01-07 ENCOUNTER — Ambulatory Visit: Payer: Medicare Other | Admitting: Urology

## 2023-01-07 ENCOUNTER — Encounter: Payer: Self-pay | Admitting: Urology

## 2023-01-07 VITALS — BP 126/75 | HR 118 | Ht 76.0 in | Wt 238.0 lb

## 2023-01-07 DIAGNOSIS — N453 Epididymo-orchitis: Secondary | ICD-10-CM

## 2023-01-07 DIAGNOSIS — N401 Enlarged prostate with lower urinary tract symptoms: Secondary | ICD-10-CM | POA: Diagnosis not present

## 2023-01-07 DIAGNOSIS — N529 Male erectile dysfunction, unspecified: Secondary | ICD-10-CM | POA: Diagnosis not present

## 2023-01-08 DIAGNOSIS — Z96652 Presence of left artificial knee joint: Secondary | ICD-10-CM | POA: Diagnosis not present

## 2023-01-08 DIAGNOSIS — M25562 Pain in left knee: Secondary | ICD-10-CM | POA: Diagnosis not present

## 2023-01-10 DIAGNOSIS — Z96652 Presence of left artificial knee joint: Secondary | ICD-10-CM | POA: Diagnosis not present

## 2023-01-10 DIAGNOSIS — M25562 Pain in left knee: Secondary | ICD-10-CM | POA: Diagnosis not present

## 2023-01-15 DIAGNOSIS — Z96652 Presence of left artificial knee joint: Secondary | ICD-10-CM | POA: Diagnosis not present

## 2023-01-18 DIAGNOSIS — Z96652 Presence of left artificial knee joint: Secondary | ICD-10-CM | POA: Diagnosis not present

## 2023-01-18 DIAGNOSIS — M25562 Pain in left knee: Secondary | ICD-10-CM | POA: Diagnosis not present

## 2023-01-23 DIAGNOSIS — M25562 Pain in left knee: Secondary | ICD-10-CM | POA: Diagnosis not present

## 2023-01-23 DIAGNOSIS — Z96652 Presence of left artificial knee joint: Secondary | ICD-10-CM | POA: Diagnosis not present

## 2023-01-29 DIAGNOSIS — Z96652 Presence of left artificial knee joint: Secondary | ICD-10-CM | POA: Diagnosis not present

## 2023-01-29 DIAGNOSIS — M25562 Pain in left knee: Secondary | ICD-10-CM | POA: Diagnosis not present

## 2023-01-31 DIAGNOSIS — Z96652 Presence of left artificial knee joint: Secondary | ICD-10-CM | POA: Diagnosis not present

## 2023-01-31 DIAGNOSIS — M25562 Pain in left knee: Secondary | ICD-10-CM | POA: Diagnosis not present

## 2023-02-05 DIAGNOSIS — Z96652 Presence of left artificial knee joint: Secondary | ICD-10-CM | POA: Diagnosis not present

## 2023-02-05 DIAGNOSIS — M25562 Pain in left knee: Secondary | ICD-10-CM | POA: Diagnosis not present

## 2023-02-07 DIAGNOSIS — Z96652 Presence of left artificial knee joint: Secondary | ICD-10-CM | POA: Diagnosis not present

## 2023-02-07 DIAGNOSIS — M25562 Pain in left knee: Secondary | ICD-10-CM | POA: Diagnosis not present

## 2023-02-20 ENCOUNTER — Ambulatory Visit (INDEPENDENT_AMBULATORY_CARE_PROVIDER_SITE_OTHER): Payer: Medicare Other | Admitting: Family Medicine

## 2023-02-20 ENCOUNTER — Other Ambulatory Visit: Payer: Self-pay | Admitting: Family Medicine

## 2023-02-20 ENCOUNTER — Encounter: Payer: Self-pay | Admitting: Family Medicine

## 2023-02-20 VITALS — BP 136/80 | HR 90 | Ht 76.0 in | Wt 250.0 lb

## 2023-02-20 DIAGNOSIS — R7303 Prediabetes: Secondary | ICD-10-CM

## 2023-02-20 DIAGNOSIS — J432 Centrilobular emphysema: Secondary | ICD-10-CM

## 2023-02-20 DIAGNOSIS — E7800 Pure hypercholesterolemia, unspecified: Secondary | ICD-10-CM

## 2023-02-20 DIAGNOSIS — I700 Atherosclerosis of aorta: Secondary | ICD-10-CM | POA: Diagnosis not present

## 2023-02-20 DIAGNOSIS — I10 Essential (primary) hypertension: Secondary | ICD-10-CM

## 2023-02-20 DIAGNOSIS — N138 Other obstructive and reflux uropathy: Secondary | ICD-10-CM

## 2023-02-20 DIAGNOSIS — Z0000 Encounter for general adult medical examination without abnormal findings: Secondary | ICD-10-CM

## 2023-02-20 LAB — POCT GLYCOSYLATED HEMOGLOBIN (HGB A1C): Hemoglobin A1C: 5.4 % (ref 4.0–5.6)

## 2023-02-20 MED ORDER — ASPIRIN 81 MG PO TBEC: 81.0000 mg | DELAYED_RELEASE_TABLET | Freq: Every day | ORAL | 12 refills | Status: DC

## 2023-02-20 NOTE — Assessment & Plan Note (Signed)
Identified on last LDCT 2022 On Statin intermittent ASA 81

## 2023-02-20 NOTE — Assessment & Plan Note (Signed)
Significant improved A1c 5.4 Goal to improve diet limit carb starches sugars

## 2023-02-20 NOTE — Assessment & Plan Note (Signed)
Controlled HYPERTENSION, mild elevated BP on home wrist cuff R arm, improved repeat manual L arm - Home BP readings improved avg  No known complications     Plan:  1. Continue current med - Losartan  daily 2. Encourage improved lifestyle - low sodium diet, regular exercise 3. Continue monitor BP outside office, bring readings to next visit, if persistently >140/90 or new symptoms notify office sooner

## 2023-02-20 NOTE — Progress Notes (Signed)
Subjective:    Patient ID: Jesse Gardner, male    DOB: Dec 05, 1945, 77 y.o.   MRN: [id number]  Jesse Gardner is a 77 y.o. male presenting on 02/20/2023 for Medical Management of Chronic Issues   HPI  Essential Hypertension / BMI >30 Home BP has been in normal range mostly occasional some elevations on wrist BP cuff R arm checked 130-140s. Here BP improved He does take Sudafed for sinuses often. Sometimes raised BP Current Meds - Losartan  daily Lifestyle: - Diet: Drinks up to pot of coffee daily, drinks tea. Not drinking soft drinks. - Exercise: No longer going to the gym. Limited exercise Denies CP, dyspnea, HA, edema, dizziness / lightheadedness   HYPERLIPIDEMIA: Last lipid panel 07/2022 prior improved Increased - On Rosuvastatin  TWICE a week, he has side effect diarrhea   Allergic Rhinosinusitis / Eustachian Tube Dysfunction Chronic issue impacting him. - Taking Flonase  He takes Claritin D occasionally or Sudafed PRN Needs re order Atrovent PRN nasal   Aortic Atherosclerosis On CT imaging last 12/2020 On intermittent statin Active smoker ASA 81   Centrilobular Emphysema Tobacco Abuse Following w/ LDCT screening last done 12/2020, due yearly Active smoker Not on maintenance therapy   Osteoarthritis bilateral Knees Chronic Knee Pain He has followed w/ Dr Joice Lofts [facility] S/p L TKR, still soreness in L Knee - Improved overall R TKR Finished blood thinner and then ASA 325 per Ortho He will pick up  ASA   Pre-Diabetes A1c 5.4, improved. Previous 5.9 to 6.3   Health Maintenance:  Due for Prevnar-20 vaccine at pharmacy.     02/20/2023    9:41 AM 11/09/2022   10:48 AM 08/21/2022    9:55 AM  Depression screen PHQ 2/9  Decreased Interest 0  0  Down, Depressed, Hopeless 0 1 0  PHQ - 2 Score 0 1 0  Altered sleeping   0  Tired, decreased energy   0  Change in appetite   0  Feeling bad or failure about yourself    0  Trouble concentrating   0   Moving slowly or fidgety/restless   0  Suicidal thoughts   0  PHQ-9 Score   0  Difficult doing work/chores   Not difficult at all    Social History   Tobacco Use   Smoking status: Every Day    Packs/day: 0.50    Years: 57.00    Additional pack years: 0.00    Total pack years: 28.50    Types: Cigarettes   Smokeless tobacco: Never   Tobacco comments:    1 cigarette in the last month   Vaping Use   Vaping Use: Never used  Substance Use Topics   Alcohol use: Yes    Alcohol/week: 2.0 standard drinks of alcohol    Types: 2 Glasses of wine per week   Drug use: Never    Review of Systems Per HPI unless specifically indicated above     Objective:    BP 136/80   Pulse 90   Ht  (1.93 m)   Wt 250 lb (113.4 kg)   BMI 30.43 kg/m   Wt Readings from Last 3 Encounters:  02/20/23 250 lb (113.4 kg)  01/07/23 238 lb (108 kg)  12/10/22 238 lb 12.8 oz (108.3 kg)    Physical Exam Vitals and nursing note reviewed.  Constitutional:      General: He is not in acute distress.    Appearance: Normal appearance. He is well-developed.  He is not diaphoretic.     Comments: Well-appearing, comfortable, cooperative  HENT:     Head: Normocephalic and atraumatic.  Eyes:     General:        Right eye: No discharge.        Left eye: No discharge.     Conjunctiva/sclera: Conjunctivae normal.  Neck:     Thyroid: No thyromegaly.  Cardiovascular:     Rate and Rhythm: Normal rate and regular rhythm.     Pulses: Normal pulses.     Heart sounds: Normal heart sounds. No murmur heard. Pulmonary:     Effort: Pulmonary effort is normal. No respiratory distress.     Breath sounds: Normal breath sounds. No wheezing or rales.  Musculoskeletal:        General: Normal range of motion.     Cervical back: Normal range of motion and neck supple.  Lymphadenopathy:     Cervical: No cervical adenopathy.  Skin:    General: Skin is warm and dry.     Findings: No erythema or rash.  Neurological:      Mental Status: He is alert and oriented to person, place, and time. Mental status is at baseline.  Psychiatric:        Mood and Affect: Mood normal.        Behavior: Behavior normal.        Thought Content: Thought content normal.     Comments: Well groomed, good eye contact, normal speech and thoughts    Results for orders placed or performed in visit on 02/20/23  POCT HgB A1C  Result Value Ref Range   Hemoglobin A1C 5.4 4.0 - 5.6 %   HbA1c POC (<> result, manual entry)     HbA1c, POC (prediabetic range)     HbA1c, POC (controlled diabetic range)        Assessment & Plan:   Problem List Items Addressed This Visit     Aortic atherosclerosis    Identified on last LDCT 2022 On Statin intermittent ASA 81      Relevant Medications   aspirin EC 81 MG tablet   Centrilobular emphysema   Essential hypertension - Primary    Controlled HYPERTENSION, mild elevated BP on home wrist cuff R arm, improved repeat manual L arm - Home BP readings improved avg  No known complications     Plan:  1. Continue current med - Losartan 50mg  daily 2. Encourage improved lifestyle - low sodium diet, regular exercise 3. Continue monitor BP outside office, bring readings to next visit, if persistently >140/90 or new symptoms notify office sooner      Relevant Medications   aspirin EC 81 MG tablet   Pre-diabetes    Significant improved A1c 5.4 Goal to improve diet limit carb starches sugars      Relevant Orders   POCT HgB A1C (Completed)   Pure hypercholesterolemia    Seems controlled on intermittent statin The 10-year ASCVD risk score (Arnett DK, et al., 2019) is: 37.1%  Plan: 1. Rosuvastatin TWICE weekly, some side effect diarrhea      Relevant Medications   aspirin EC 81 MG tablet      Meds ordered this encounter  Medications   aspirin EC 81 MG tablet    Sig: Take 1 tablet (81 mg total) by mouth daily. Swallow whole.    Dispense:  30 tablet    Refill:  12     Follow up  plan: Return in about 6 months (around  08/22/2023) for 6 month fasting lab only then 1 week later Annual Physical.  Future labs ordered for 07/2023  Jesse Pilar, DO [facility] 02/20/2023, 9:53 AM

## 2023-02-20 NOTE — Assessment & Plan Note (Signed)
Seems controlled on intermittent statin The 10-year ASCVD risk score (Arnett DK, et al., 2019) is: 37.1%  Plan: 1. Rosuvastatin TWICE weekly, some side effect diarrhea

## 2023-02-20 NOTE — Patient Instructions (Addendum)
Thank you for coming to the office today.  Finished Aspirin  Start baby Aspirin  daily  BP is overall pretty good. I am okay with it right now. Keep checking LEFT arm with your BP cuff it may be higher than our cuff.  It is controlled  Keep on Losartan  daily. Let me know if need more refills.  Recommend Prevnar-20 vaccine at pharmacy when ready..   DUE for FASTING BLOOD WORK (no food or drink after midnight before the lab appointment, only water or coffee without cream/sugar on the morning of)  SCHEDULE "Lab Only" visit in the morning at the clinic for lab draw in 6 MONTHS   - Make sure Lab Only appointment is at about 1 week before your next appointment, so that results will be available  For Lab Results, once available within 2-3 days of blood draw, you can can log in to MyChart online to view your results and a brief explanation. Also, we can discuss results at next follow-up visit.   Please schedule a Follow-up Appointment to: Return in about 6 months (around 08/22/2023) for 6 month fasting lab only then 1 week later Annual Physical.  If you have any other questions or concerns, please feel free to call the office or send a message through MyChart. You may also schedule an earlier appointment if necessary.  Additionally, you may be receiving a survey about your experience at our office within a few days to 1 week by e-mail or mail. We value your feedback.  Saralyn Pilar, DO [facility], [place]

## 2023-02-24 ENCOUNTER — Other Ambulatory Visit: Payer: Self-pay | Admitting: Acute Care

## 2023-02-24 DIAGNOSIS — Z122 Encounter for screening for malignant neoplasm of respiratory organs: Secondary | ICD-10-CM

## 2023-02-24 DIAGNOSIS — Z87891 Personal history of nicotine dependence: Secondary | ICD-10-CM

## 2023-02-24 DIAGNOSIS — F17210 Nicotine dependence, cigarettes, uncomplicated: Secondary | ICD-10-CM

## 2023-03-18 ENCOUNTER — Ambulatory Visit: Payer: Medicare Other | Attending: Family Medicine

## 2023-05-13 DIAGNOSIS — M1712 Unilateral primary osteoarthritis, left knee: Secondary | ICD-10-CM | POA: Diagnosis not present

## 2023-05-13 DIAGNOSIS — Z96652 Presence of left artificial knee joint: Secondary | ICD-10-CM | POA: Diagnosis not present

## 2023-06-03 ENCOUNTER — Ambulatory Visit (INDEPENDENT_AMBULATORY_CARE_PROVIDER_SITE_OTHER): Admit: 2023-06-03 | Discharge: 2023-06-03 | Disposition: A | Discharge status: Home / Self Care | Payer: Medicare Other

## 2023-06-03 ENCOUNTER — Ambulatory Visit
Admission: EM | Admit: 2023-06-03 | Discharge: 2023-06-03 | Disposition: A | Discharge status: Home / Self Care | Payer: Medicare Other | Attending: Urgent Care | Admitting: Urgent Care

## 2023-06-03 DIAGNOSIS — R221 Localized swelling, mass and lump, neck: Secondary | ICD-10-CM | POA: Diagnosis not present

## 2023-06-03 DIAGNOSIS — K115 Sialolithiasis: Secondary | ICD-10-CM

## 2023-06-03 NOTE — ED Triage Notes (Signed)
Pt c/o L sided facial swelling x2 days. States no known cause. Denies any dental pain.

## 2023-06-03 NOTE — ED Provider Notes (Signed)
[facility]    CSN: [id number] Arrival date & time: 06/03/23  8295      History   Chief Complaint Chief Complaint  Patient presents with   Facial Swelling    HPI Jesse Gardner is a 77 y.o. male.   Pleasant 77 year old male presents due to concerns of left-sided facial swelling.  He states that in the past he has had something similar, but is uncertain what the cause was.  Previously resolved without medical intervention. Reports over the past 2 to 3 days he has noticed increased swelling.  He was eating a pork biscuit this morning, and states that the swelling was significantly worse with eating, decreased slightly after termination of eating.  He reports the pain is only on the L in front of his ear and extending down to his L ramus, minimally in the submandibular region on L. Denies any dysphagia or decreased ROM of neck. Denies fever. No tx tried. Pt does smoke.     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   Aortic atherosclerosis (HCC) 11/15/2021   Primary osteoarthritis of right knee 09/13/2021   Right otitis media 09/13/2020   Lymphadenopathy of head and neck 09/13/2020   External hemorrhoids 01/01/2020   Centrilobular emphysema (HCC) 04/02/2019   Pre-diabetes 01/22/2019   Pure hypercholesterolemia 01/22/2019   Bilateral chronic knee pain 10/24/2018   Osteoarthritis of knees, bilateral 10/24/2018   Essential hypertension 10/24/2018   Allergic rhinitis due to allergen 10/24/2018   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   Obesity (BMI  30.0-34.9) 10/24/2018   Primary osteoarthritis of left knee 10/24/2018    Past Surgical History:  Procedure Laterality Date   APPENDECTOMY     COLONOSCOPY     HERNIA REPAIR Right    inguinal   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   Procedure: TOTAL KNEE ARTHROPLASTY;  Surgeon: Christena Flake, MD;  Location: [facility];  Service: Orthopedics;  Laterality: Right;   TOTAL KNEE ARTHROPLASTY Left 11/13/2022   Procedure: TOTAL KNEE ARTHROPLASTY;  Surgeon: Christena Flake, MD;  Location: [facility];  Service: Orthopedics;  Laterality: Left;       Home Medications    Prior to Admission medications   Medication Sig Start Date End Date Taking? Authorizing Provider  aspirin EC 81 MG tablet Take 1 tablet (81 mg total) by mouth daily. Swallow whole. 02/20/23  Yes Karamalegos, Netta Neat, DO  fluticasone (FLONASE) 50 MCG/ACT nasal spray Place 2 sprays into both nostrils daily as needed. 11/13/22  Yes Poggi, Excell Seltzer, MD  ipratropium (ATROVENT) 0.06 % nasal spray Place 2 sprays into both nostrils 4 (four) times daily as needed for rhinitis. 08/21/22  Yes Karamalegos, Netta Neat, DO  losartan (COZAAR) 50 MG tablet TAKE 1 TABLET(50 MG) BY MOUTH DAILY 11/05/22  Yes Karamalegos, Netta Neat, DO  Melatonin 5 MG CAPS Take 5 mg by mouth at bedtime as needed (sleep).   Yes [provider]  rosuvastatin (CRESTOR) 10 MG tablet Take 10 mg by mouth 2 (  two) times a week.   Yes [provider]    Family History Family History  Problem Relation Age of Onset   Alzheimer's disease Mother    Diabetes Father    Prostate cancer Brother     Social History Social History   Tobacco Use   Smoking status: Every Day    Current packs/day: 0.50    Average packs/day: 0.5 packs/day for 57.0 years (28.5 ttl pk-yrs)    Types: Cigarettes   Smokeless tobacco: Never   Tobacco comments:    1 cigarette in the last month   Vaping Use   Vaping status: Never Used  Substance Use Topics   Alcohol use: Yes     Alcohol/week: 2.0 standard drinks of alcohol    Types: 2 Glasses of wine per week   Drug use: Never     Allergies   Patient has no known allergies.   Review of Systems Review of Systems As per HPI  Physical Exam Triage Vital Signs ED Triage Vitals  Encounter Vitals Group     BP 06/03/23 0849 (!) 140/76     Systolic BP Percentile --      Diastolic BP Percentile --      Pulse Rate 06/03/23 0849 90     Resp 06/03/23 0849 16     Temp 06/03/23 0849 97.8 F (36.6 C)     Temp Source 06/03/23 0849 Oral     SpO2 06/03/23 0849 95 %     Weight 06/03/23 0848 243 lb (110.2 kg)     Height 06/03/23 0848 6\' 4"  (1.93 m)     Head Circumference --      Peak Flow --      Pain Score 06/03/23 0852 0     Pain Loc --      Pain Education --      Exclude from Growth Chart --    No data found.  Updated Vital Signs BP (!) 140/76 (BP Location: Left Arm)   Pulse 90   Temp 97.8 F (36.6 C) (Oral)   Resp 16   Ht 6\' 4"  (1.93 m)   Wt 243 lb (110.2 kg)   SpO2 95%   BMI 29.58 kg/m   Visual Acuity Right Eye Distance:   Left Eye Distance:   Bilateral Distance:    Right Eye Near:   Left Eye Near:    Bilateral Near:     Physical Exam Vitals and nursing note reviewed.  Constitutional:      General: He is not in acute distress.    Appearance: Normal appearance. He is normal weight. He is not ill-appearing or toxic-appearing.  HENT:     Head: Normocephalic and atraumatic.     Jaw: There is normal jaw occlusion. No trismus, tenderness, swelling, pain on movement or malocclusion.     Salivary Glands: Right salivary gland is not diffusely enlarged or tender. Left salivary gland is diffusely enlarged and tender.      Right Ear: Tympanic membrane is not injected, scarred, perforated, erythematous or bulging.     Left Ear: Tympanic membrane is not injected, scarred, perforated, erythematous or bulging.     Nose: Nose normal. No nasal deformity, congestion or rhinorrhea.     Mouth/Throat:      Lips: Pink.     Mouth: Mucous membranes are moist.     Dentition: Abnormal dentition. Has dentures. No dental abscesses or gum lesions.     Tongue: No lesions. Tongue does not deviate from midline.  Palate: No mass and lesions.     Pharynx: Oropharynx is clear. Uvula midline. No pharyngeal swelling, oropharyngeal exudate, posterior oropharyngeal erythema or uvula swelling.  Eyes:     General: No scleral icterus.       Right eye: No discharge.        Left eye: No discharge.  Pulmonary:     Effort: Pulmonary effort is normal. No respiratory distress.     Breath sounds: Wheezing present.  Musculoskeletal:     Cervical back: Normal range of motion and neck supple. No rigidity or tenderness.  Skin:    General: Skin is warm and dry.     Findings: No erythema or rash.  Neurological:     General: No focal deficit present.     Mental Status: He is alert and oriented to person, place, and time.     Cranial Nerves: No cranial nerve deficit.  Psychiatric:        Mood and Affect: Mood normal.        Behavior: Behavior normal.      UC Treatments / Results  Labs (all labs ordered are listed, but only abnormal results are displayed) Labs Reviewed - No data to display  EKG   Radiology US SOFT TISSUE HEAD & NECK (NON-THYROID)  Result Date: 06/03/2023 CLINICAL DATA:  77 year old male with history of left facial palpable abnormality. EXAM: ULTRASOUND OF HEAD/NECK SOFT TISSUES TECHNIQUE: Ultrasound examination of the head and neck soft tissues was performed in the area of clinical concern. COMPARISON:  None Available. FINDINGS: No sonographic abnormality in the left mandibular region of concern. No evidence of cervical lymphadenopathy. IMPRESSION: No sonographic abnormality in the left mandibular region of concern. Electronically Signed   By: Marliss Coots M.D.   On: 06/03/2023 09:55    Procedures Procedures (including critical care time)  Medications Ordered in UC Medications - No data to  display  Initial Impression / Assessment and Plan / UC Course  I have reviewed the triage vital signs and the nursing notes.  Pertinent labs & imaging results that were available during my care of the patient were reviewed by me and considered in my medical decision making (see chart for details).     Sialolithiasis - pt's sx consistent with sialolithiasis. Risk factors include smoking. Pt has had sx in the past as well. Will have pt suck on lemon, war heads or lemon heads to hopefully express the stone. VSS, pt afebrile Swelling in neck - there is a visible difference between R and L side of neck with a palpable, non-painful mass to the L side (see pic) however ultrasound did not reveal a source of abnormality. Recommended pt f/U with his PCP to possibly discuss alternative imaging modalities.   Final Clinical Impressions(s) / UC Diagnoses   Final diagnoses:  Sialolithiasis  Localized swelling, mass and lump, neck     Discharge Instructions      Your ultrasound does not show any concerning abnormalities on the side of your symptoms. You are likely suffering from a salivary stone. Please read the attached handouts regarding this. Smoking is a risk factor for this, so cutting back or quitting may be beneficial. The best treatment is with a sialagogue, something extremely sour such as a lemon, lemon heads, wore head candies.  Sucking on these several times daily may release the stone.  If your symptoms persist or worsen, please follow-up with your primary care physician or an ENT for further evaluation and assessment.  ED Prescriptions   None    PDMP not reviewed this encounter.   Maretta Bees, [place] 06/03/23 1236

## 2023-06-03 NOTE — Discharge Instructions (Signed)
Your ultrasound does not show any concerning abnormalities on the side of your symptoms. You are likely suffering from a salivary stone. Please read the attached handouts regarding this. Smoking is a risk factor for this, so cutting back or quitting may be beneficial. The best treatment is with a sialagogue, something extremely sour such as a lemon, lemon heads, wore head candies.  Sucking on these several times daily may release the stone.  If your symptoms persist or worsen, please follow-up with your primary care physician or an ENT for further evaluation and assessment.

## 2023-08-22 ENCOUNTER — Other Ambulatory Visit: Payer: Medicare Other

## 2023-08-22 DIAGNOSIS — N138 Other obstructive and reflux uropathy: Secondary | ICD-10-CM

## 2023-08-22 DIAGNOSIS — R7303 Prediabetes: Secondary | ICD-10-CM | POA: Diagnosis not present

## 2023-08-22 DIAGNOSIS — E7800 Pure hypercholesterolemia, unspecified: Secondary | ICD-10-CM

## 2023-08-22 DIAGNOSIS — I10 Essential (primary) hypertension: Secondary | ICD-10-CM | POA: Diagnosis not present

## 2023-08-22 DIAGNOSIS — Z0000 Encounter for general adult medical examination without abnormal findings: Secondary | ICD-10-CM

## 2023-08-23 LAB — COMPLETE METABOLIC PANEL WITH GFR
AG Ratio: 1.6 (calc) (ref 1.0–2.5)
ALT: 23 U/L (ref 9–46)
AST: 20 U/L (ref 10–35)
Albumin: 4.6 g/dL (ref 3.6–5.1)
Alkaline phosphatase (APISO): 67 U/L (ref 35–144)
BUN: 19 mg/dL (ref 7–25)
CO2: 27 mmol/L (ref 20–32)
Calcium: 9.8 mg/dL (ref 8.6–10.3)
Chloride: 102 mmol/L (ref 98–110)
Creat: 0.75 mg/dL (ref 0.70–1.28)
Globulin: 2.9 g/dL (ref 1.9–3.7)
Glucose, Bld: 109 mg/dL — ABNORMAL HIGH (ref 65–99)
Potassium: 4.3 mmol/L (ref 3.5–5.3)
Sodium: 140 mmol/L (ref 135–146)
Total Bilirubin: 0.5 mg/dL (ref 0.2–1.2)
Total Protein: 7.5 g/dL (ref 6.1–8.1)
eGFR: 93 mL/min/{1.73_m2} (ref >=60)

## 2023-08-23 LAB — CBC WITH DIFFERENTIAL/PLATELET
Absolute Lymphocytes: 3682 {cells}/uL (ref 850–3900)
Absolute Monocytes: 1134 {cells}/uL — ABNORMAL HIGH (ref 200–950)
Basophils Absolute: 52 {cells}/uL (ref 0–200)
Basophils Relative: 0.5 %
Eosinophils Absolute: 260 {cells}/uL (ref 15–500)
Eosinophils Relative: 2.5 %
HCT: 44.4 % (ref 38.5–50.0)
Hemoglobin: 14.3 g/dL (ref 13.2–17.1)
MCH: 27.8 pg (ref 27.0–33.0)
MCHC: 32.2 g/dL (ref 32.0–36.0)
MCV: 86.4 fL (ref 80.0–100.0)
MPV: 12.1 fL (ref 7.5–12.5)
Monocytes Relative: 10.9 %
Neutro Abs: 5273 {cells}/uL (ref 1500–7800)
Neutrophils Relative %: 50.7 %
Platelets: 251 10*3/uL (ref 140–400)
RBC: 5.14 10*6/uL (ref 4.20–5.80)
RDW: 13.2 % (ref 11.0–15.0)
Total Lymphocyte: 35.4 %
WBC: 10.4 10*3/uL (ref 3.8–10.8)

## 2023-08-23 LAB — HEMOGLOBIN A1C
Hgb A1c MFr Bld: 6.8 %{Hb} — ABNORMAL HIGH (ref <5.7)
Mean Plasma Glucose: 148 mg/dL
eAG (mmol/L): 8.2 mmol/L

## 2023-08-23 LAB — LIPID PANEL
Cholesterol: 200 mg/dL — ABNORMAL HIGH (ref <200)
HDL: 45 mg/dL (ref >=40)
LDL Cholesterol (Calc): 134 mg/dL — ABNORMAL HIGH
Non-HDL Cholesterol (Calc): 155 mg/dL — ABNORMAL HIGH (ref <130)
Total CHOL/HDL Ratio: 4.4 (calc) (ref <5.0)
Triglycerides: 105 mg/dL (ref <150)

## 2023-08-23 LAB — TSH: TSH: 4.93 m[IU]/L — ABNORMAL HIGH (ref 0.40–4.50)

## 2023-08-23 LAB — PSA: PSA: 2.45 ng/mL (ref <=4.00)

## 2023-08-28 ENCOUNTER — Encounter: Payer: Medicare Other | Admitting: Family Medicine

## 2023-08-28 ENCOUNTER — Other Ambulatory Visit: Payer: Self-pay | Admitting: Family Medicine

## 2023-08-28 DIAGNOSIS — I10 Essential (primary) hypertension: Secondary | ICD-10-CM

## 2023-08-29 NOTE — Telephone Encounter (Signed)
Requested Prescriptions  Pending Prescriptions Disp Refills   losartan (COZAAR) 50 MG tablet [Pharmacy Med Name: LOSARTAN 50MG  TABLETS] 90 tablet 0    Sig: TAKE 1 TABLET(50 MG) BY MOUTH DAILY     Cardiovascular:  Angiotensin Receptor Blockers Failed - 08/28/2023  4:27 PM      Failed - Last BP in normal range    BP Readings from Last 1 Encounters:  06/03/23 (!) 140/76         Failed - Valid encounter within last 6 months    Recent Outpatient Visits           6 months ago Essential hypertension   [facility] [facility] Smitty Cords, DO   1 year ago Annual physical exam   [facility] Smitty Cords, DO   1 year ago Essential hypertension   [facility] Smitty Cords, DO   2 years ago Annual physical exam   [place] [facility] Smitty Cords, DO   2 years ago Essential hypertension   [facility] [place], Netta Neat, DO              Passed - Cr in normal range and within 180 days    Creat  Date Value Ref Range Status  08/22/2023 0.75 0.70 - 1.28 mg/dL Final         Passed - K in normal range and within 180 days    Potassium  Date Value Ref Range Status  08/22/2023 4.3 3.5 - 5.3 mmol/L Final         Passed - Patient is not pregnant

## 2023-09-03 ENCOUNTER — Other Ambulatory Visit: Payer: Self-pay | Admitting: Family Medicine

## 2023-09-03 ENCOUNTER — Ambulatory Visit (INDEPENDENT_AMBULATORY_CARE_PROVIDER_SITE_OTHER): Payer: Medicare Other | Admitting: Family Medicine

## 2023-09-03 ENCOUNTER — Encounter: Payer: Self-pay | Admitting: Family Medicine

## 2023-09-03 VITALS — BP 138/72 | HR 98 | Ht 72.5 in | Wt 261.0 lb

## 2023-09-03 DIAGNOSIS — R7303 Prediabetes: Secondary | ICD-10-CM

## 2023-09-03 DIAGNOSIS — I10 Essential (primary) hypertension: Secondary | ICD-10-CM | POA: Diagnosis not present

## 2023-09-03 DIAGNOSIS — I700 Atherosclerosis of aorta: Secondary | ICD-10-CM | POA: Diagnosis not present

## 2023-09-03 DIAGNOSIS — R7989 Other specified abnormal findings of blood chemistry: Secondary | ICD-10-CM

## 2023-09-03 DIAGNOSIS — E7800 Pure hypercholesterolemia, unspecified: Secondary | ICD-10-CM

## 2023-09-03 DIAGNOSIS — Z23 Encounter for immunization: Secondary | ICD-10-CM | POA: Diagnosis not present

## 2023-09-03 DIAGNOSIS — N401 Enlarged prostate with lower urinary tract symptoms: Secondary | ICD-10-CM

## 2023-09-03 DIAGNOSIS — Z0000 Encounter for general adult medical examination without abnormal findings: Secondary | ICD-10-CM

## 2023-09-03 DIAGNOSIS — E66811 Obesity, class 1: Secondary | ICD-10-CM

## 2023-09-03 DIAGNOSIS — N138 Other obstructive and reflux uropathy: Secondary | ICD-10-CM | POA: Diagnosis not present

## 2023-09-03 DIAGNOSIS — Z1211 Encounter for screening for malignant neoplasm of colon: Secondary | ICD-10-CM | POA: Diagnosis not present

## 2023-09-03 DIAGNOSIS — J432 Centrilobular emphysema: Secondary | ICD-10-CM | POA: Diagnosis not present

## 2023-09-03 MED ORDER — LOSARTAN POTASSIUM 50 MG PO TABS: 50.0000 mg | ORAL_TABLET | Freq: Every day | ORAL | 3 refills | Status: DC

## 2023-09-03 NOTE — Progress Notes (Signed)
Subjective:    Patient ID: Jesse Gardner, male    DOB: January 18, 1946, 77 y.o.   MRN: [id number]  Jesse Gardner is a 77 y.o. male presenting on 09/03/2023 for Annual Exam   HPI  Here for Annual Physical and Lab Review  Discussed the use of AI scribe software for clinical note transcription with the patient, who gave verbal consent to proceed.    Pre-Diabetes Elevated A1c 6.8, prior range 5.4 to 6.3 He admits inc oral intake and inc tootsie pop intake as he is nearly quitting smoking. At risk of Type 2 Diabetes progression  Essential Hypertension / BMI >34 Home BP has been in normal range mostly occasional some elevations home BP cuff He does take Sudafed for sinuses often. Sometimes raised BP Current Meds - Losartan 50mg  daily (ran out for few days to week, he got re order recently) Lifestyle: - Diet: Drinks up to pot of coffee daily, drinks tea. Not drinking soft drinks. - Exercise: No longer going to the gym. Limited exercise Denies CP, dyspnea, HA, edema, dizziness / lightheadedness   HYPERLIPIDEMIA: Last lipid panel 07/2023 LDL 134 improved. Intermittent dosing on Rosuvastatin 10mg  TWICE a week, he has side effect diarrhea   Allergic Rhinosinusitis / Eustachian Tube Dysfunction Chronic issue impacting him. - Taking Flonase  He takes Claritin D occasionally or Sudafed PRN Needs re order Atrovent PRN nasal   Aortic Atherosclerosis On CT imaging last 12/2020 On intermittent statin Active smoker ASA 81   Centrilobular Emphysema Tobacco Abuse Following w/ LDCT screening Last LDCT 02/2022. He is due for repeat Scan but it was not scheduled since he missed a call for scheduling. Nearly quit smoking now. He has substituted smoking cig with eating Tootsie pops Not on maintenance therapy   Osteoarthritis bilateral Knees Chronic Knee Pain He has followed w/ Dr Joice Lofts [facility] S/p L TKR, still soreness in L Knee - Improved overall R TKR Finished blood thinner and  then ASA 325 per Ortho He will pick up 81mg  ASA    Elevated TSH TSH up to 4.93 mild elevated No prior Free T4  The patient has been experiencing some back pain on the left side, near the location of a previous kidney cyst. He denies any hematuria but expresses concern about potential recurrence of kidney issues.  Health Maintenance:   PSA 2.45  Due for Prevnar-20 vaccine at pharmacy.  Last Cologuard done 2021, he is due for repeat. He requests it today     09/03/2023   10:03 AM 02/20/2023    9:41 AM 11/09/2022   10:48 AM  Depression screen PHQ 2/9  Decreased Interest 0 0   Down, Depressed, Hopeless 0 0 1  PHQ - 2 Score 0 0 1  Altered sleeping 1    Tired, decreased energy 0    Change in appetite 0    Feeling bad or failure about yourself  0    Trouble concentrating 0    Moving slowly or fidgety/restless 0    Suicidal thoughts 0    PHQ-9 Score 1    Difficult doing work/chores Not difficult at all      Past Medical History:  Diagnosis Date   Aortic atherosclerosis (HCC)    Arthritis    Asthma    patient reports "as a child"   COPD (chronic obstructive pulmonary disease) (HCC)    Dyspnea    Hyperlipidemia    Hypertension, essential    Osteoarthritis of both knees    Pre-diabetes  Renal abscess, 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   Past Surgical History:  Procedure Laterality Date   APPENDECTOMY     COLONOSCOPY     HERNIA REPAIR Right    inguinal   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   Procedure: TOTAL KNEE ARTHROPLASTY;  Surgeon: Christena Flake, MD;  Location: [facility];  Service: Orthopedics;  Laterality: Right;   TOTAL KNEE ARTHROPLASTY Left 11/13/2022   Procedure: TOTAL KNEE ARTHROPLASTY;  Surgeon: Christena Flake, MD;  Location: [facility];  Service: Orthopedics;  Laterality: Left;   Social History   Socioeconomic History   Marital status: Widowed    Spouse name: Not on file   Number of children: 2   Years of education: Not on file   Highest education level: Not on file   Occupational History   Occupation: retired  Tobacco Use   Smoking status: Every Day    Current packs/day: 0.50    Average packs/day: 0.5 packs/day for 57.0 years (28.5 ttl pk-yrs)    Types: Cigarettes   Smokeless tobacco: Never   Tobacco comments:    1 cigarette in the last month   Vaping Use   Vaping status: Never Used  Substance and Sexual Activity   Alcohol use: Yes    Alcohol/week: 2.0 standard drinks of alcohol    Types: 2 Glasses of wine per week   Drug use: Never   Sexual activity: Not on file  Other Topics Concern   Not on file  Social History Narrative   Lives alone   Social Determinants of Health   Financial Resource Strain: Low Risk  (11/09/2022)   Overall Financial Resource Strain (CARDIA)    Difficulty of Paying Living Expenses: Not hard at all  Food Insecurity: No Food Insecurity (11/09/2022)   Hunger Vital Sign    Worried About Running Out of Food in the Last Year: Never true    Ran Out of Food in the Last Year: Never true  Transportation Needs: No Transportation Needs (11/09/2022)   PRAPARE - Administrator, Civil Service (Medical): No    Lack of Transportation (Non-Medical): No  Physical Activity: Insufficiently Active (11/09/2022)   Exercise Vital Sign    Days of Exercise per Week: 2 days    Minutes of Exercise per Session: 20 min  Stress: No Stress Concern Present (11/09/2022)   [employer] of Occupational Health - Occupational Stress Questionnaire    Feeling of Stress : Not at all  Social Connections: Moderately Isolated (11/09/2022)   Social Connection and Isolation Panel [NHANES]    Frequency of Communication with Friends and Family: Three times a week    Frequency of Social Gatherings with Friends and Family: Twice a week    Attends Religious Services: 1 to 4 times per year    Active Member of [employer] or Organizations: No    Attends Banker Meetings: Never    Marital Status: Widowed  Intimate Partner Violence: Not At  Risk (11/09/2022)   Humiliation, Afraid, Rape, and Kick questionnaire    Fear of Current or Ex-Partner: No    Emotionally Abused: No    Physically Abused: No    Sexually Abused: No   Family History  Problem Relation Age of Onset   Alzheimer's disease Mother    Diabetes Father    Prostate cancer Brother    Current Outpatient Medications on File Prior to Visit  Medication Sig   aspirin EC 81 MG tablet Take 1 tablet (81 mg total) by mouth daily.  Swallow whole.   fluticasone (FLONASE) 50 MCG/ACT nasal spray Place 2 sprays into both nostrils daily as needed.   ipratropium (ATROVENT) 0.06 % nasal spray Place 2 sprays into both nostrils 4 (four) times daily as needed for rhinitis.   Melatonin 5 MG CAPS Take 5 mg by mouth at bedtime as needed (sleep).   rosuvastatin (CRESTOR) 10 MG tablet Take 10 mg by mouth 2 (two) times a week.   No current facility-administered medications on file prior to visit.    Review of Systems  Constitutional:  Negative for activity change, appetite change, chills, diaphoresis, fatigue and fever.  HENT:  Negative for congestion and hearing loss.   Eyes:  Negative for visual disturbance.  Respiratory:  Negative for cough, chest tightness, shortness of breath and wheezing.   Cardiovascular:  Negative for chest pain, palpitations and leg swelling.  Gastrointestinal:  Negative for abdominal pain, constipation, diarrhea, nausea and vomiting.  Genitourinary:  Negative for dysuria, frequency and hematuria.  Musculoskeletal:  Negative for arthralgias and neck pain.  Skin:  Negative for rash.  Neurological:  Negative for dizziness, weakness, light-headedness, numbness and headaches.  Hematological:  Negative for adenopathy.  Psychiatric/Behavioral:  Negative for behavioral problems, dysphoric mood and sleep disturbance.    Per HPI unless specifically indicated above     Objective:    BP 138/72   Pulse 98   Ht 6' 0.5" (1.842 m)   Wt 261 lb (118.4 kg)   SpO2 94%    BMI 34.91 kg/m   Wt Readings from Last 3 Encounters:  09/03/23 261 lb (118.4 kg)  06/03/23 243 lb (110.2 kg)  02/20/23 250 lb (113.4 kg)    Physical Exam Vitals and nursing note reviewed.  Constitutional:      General: He is not in acute distress.    Appearance: He is well-developed. He is not diaphoretic.     Comments: Well-appearing, comfortable, cooperative  HENT:     Head: Normocephalic and atraumatic.  Eyes:     General:        Right eye: No discharge.        Left eye: No discharge.     Conjunctiva/sclera: Conjunctivae normal.     Pupils: Pupils are equal, round, and reactive to light.  Neck:     Thyroid: No thyromegaly.  Cardiovascular:     Rate and Rhythm: Normal rate and regular rhythm.     Pulses: Normal pulses.     Heart sounds: Normal heart sounds. No murmur heard. Pulmonary:     Effort: Pulmonary effort is normal. No respiratory distress.     Breath sounds: Normal breath sounds. No wheezing or rales.  Abdominal:     General: Bowel sounds are normal. There is no distension.     Palpations: Abdomen is soft. There is no mass.     Tenderness: There is no abdominal tenderness.  Musculoskeletal:        General: No tenderness. Normal range of motion.     Cervical back: Normal range of motion and neck supple.     Right lower leg: No edema.     Left lower leg: No edema.     Comments: Upper / Lower Extremities: - Normal muscle tone, strength bilateral upper extremities 5/5, lower extremities 5/5  Lymphadenopathy:     Cervical: No cervical adenopathy.  Skin:    General: Skin is warm and dry.     Findings: No erythema or rash.  Neurological:     Mental Status: He is  alert and oriented to person, place, and time.     Comments: Distal sensation intact to light touch all extremities  Psychiatric:        Mood and Affect: Mood normal.        Behavior: Behavior normal.        Thought Content: Thought content normal.     Comments: Well groomed, good eye contact, normal  speech and thoughts      Results for orders placed or performed in visit on 08/22/23  TSH  Result Value Ref Range   TSH 4.93 (H) 0.40 - 4.50 mIU/L  PSA  Result Value Ref Range   PSA 2.45 < OR = 4.00 ng/mL  Lipid panel  Result Value Ref Range   Cholesterol 200 (H) <200 mg/dL   HDL 45 > OR = 40 mg/dL   Triglycerides 657 <846 mg/dL   LDL Cholesterol (Calc) 134 (H) mg/dL (calc)   Total CHOL/HDL Ratio 4.4 <5.0 (calc)   Non-HDL Cholesterol (Calc) 155 (H) <130 mg/dL (calc)  Hemoglobin N6E  Result Value Ref Range   Hgb A1c MFr Bld 6.8 (H) <5.7 % of total Hgb   Mean Plasma Glucose 148 mg/dL   eAG (mmol/L) 8.2 mmol/L  CBC with Differential/Platelet  Result Value Ref Range   WBC 10.4 3.8 - 10.8 Thousand/uL   RBC 5.14 4.20 - 5.80 Million/uL   Hemoglobin 14.3 13.2 - 17.1 g/dL   HCT 95.2 84.1 - 32.4 %   MCV 86.4 80.0 - 100.0 fL   MCH 27.8 27.0 - 33.0 pg   MCHC 32.2 32.0 - 36.0 g/dL   RDW 40.1 02.7 - 25.3 %   Platelets 251 140 - 400 Thousand/uL   MPV 12.1 7.5 - 12.5 fL   Neutro Abs 5,273 1,500 - 7,800 cells/uL   Absolute Lymphocytes 3,682 850 - 3,900 cells/uL   Absolute Monocytes 1,134 (H) 200 - 950 cells/uL   Eosinophils Absolute 260 15 - 500 cells/uL   Basophils Absolute 52 0 - 200 cells/uL   Neutrophils Relative % 50.7 %   Total Lymphocyte 35.4 %   Monocytes Relative 10.9 %   Eosinophils Relative 2.5 %   Basophils Relative 0.5 %  COMPLETE METABOLIC PANEL WITH GFR  Result Value Ref Range   Glucose, Bld 109 (H) 65 - 99 mg/dL   BUN 19 7 - 25 mg/dL   Creat 6.64 4.03 - 4.74 mg/dL   eGFR 93 > OR = 60 QV/ZDG/3.87F6   BUN/Creatinine Ratio SEE NOTE: 6 - 22 (calc)   Sodium 140 135 - 146 mmol/L   Potassium 4.3 3.5 - 5.3 mmol/L   Chloride 102 98 - 110 mmol/L   CO2 27 20 - 32 mmol/L   Calcium 9.8 8.6 - 10.3 mg/dL   Total Protein 7.5 6.1 - 8.1 g/dL   Albumin 4.6 3.6 - 5.1 g/dL   Globulin 2.9 1.9 - 3.7 g/dL (calc)   AG Ratio 1.6 1.0 - 2.5 (calc)   Total Bilirubin 0.5 0.2 - 1.2  mg/dL   Alkaline phosphatase (APISO) 67 35 - 144 U/L   AST 20 10 - 35 U/L   ALT 23 9 - 46 U/L      Assessment & Plan:   Problem List Items Addressed This Visit     Aortic atherosclerosis (HCC)   Relevant Medications   losartan (COZAAR) 50 MG tablet   BPH with obstruction/lower urinary tract symptoms   Centrilobular emphysema (HCC)   Essential hypertension   Relevant Medications  losartan (COZAAR) 50 MG tablet   Obesity (BMI 30.0-34.9)   Pre-diabetes   Pure hypercholesterolemia   Relevant Medications   losartan (COZAAR) 50 MG tablet   Other Visit Diagnoses     Annual physical exam    -  Primary   Need for influenza vaccination       Relevant Orders   Flu Vaccine Trivalent High Dose (Fluad) (Completed)   Need for Tdap vaccination       Relevant Orders   Tdap vaccine greater than or equal to 7yo IM (Completed)   Need for pneumococcal 20-valent conjugate vaccination       Relevant Orders   Pneumococcal conjugate vaccine 20-valent (Prevnar 20) (Completed)   Screen for colon cancer       Relevant Orders   Cologuard       Updated Health Maintenance information Reviewed recent lab results with patient Encouraged improvement to lifestyle with diet and exercise Goal of weight loss     Hypertension Blood pressure slightly elevated at 138/72. Patient reported running out of medication and missing doses. On Losartan 50mg  daily -Add extra refills for blood pressure medication to prevent future gaps in treatment.  Hyperlipidemia Not at goal, but improved. LDL improved to 134 on twice weekly statin therapy. Patient reports inconsistent adherence to medication regimen. -Encourage consistent use of statin therapy twice weekly.  Prediabetes HbA1c increased to 6.8, indicating progression towards diabetes. Patient reports increased intake of sweets and starches. -Encourage reduction in carbohydrate intake and consistent physical activity. -Recheck HbA1c in 6 months. If >6.5  would be new diagnosis DM  Smoking Cessation Patient reports successful cessation of smoking, using Tootsie Pops as a substitute. -Encourage continued abstinence from smoking.  Thyroid Function Slightly elevated TSH. Possible Hypothyroidism. Will repeat lab w/ Free T4 -Recheck thyroid function in 6 months.  Renal Cyst Patient reports history of renal cyst and recent flank pain. -Order urinalysis at next visit to assess for possible renal issues.  Colon Cancer Screening Patient due for repeat Cologuard test. -Order Cologuard test.  Lung Cancer Screening Patient missed annual lung cancer screening. -Request lung specialists to reach out to patient to schedule lung cancer screening.  Vaccinations Patient due for multiple vaccinations. -Administer vaccinations today.        Orders Placed This Encounter  Procedures   Flu Vaccine Trivalent High Dose (Fluad)   Tdap vaccine greater than or equal to 7yo IM   Pneumococcal conjugate vaccine 20-valent (Prevnar 20)   Cologuard     Meds ordered this encounter  Medications   losartan (COZAAR) 50 MG tablet    Sig: Take 1 tablet (50 mg total) by mouth daily.    Dispense:  90 tablet    Refill:  3    Add future refills on file     Follow up plan: Return in about 6 months (around 03/02/2024) for 6 month fasting lab only then 1 week later Follow-up PreDM, Thyroid, HTN.  Future labs 03/02/24 A1c TSH T4, Urinalysis   Saralyn Pilar, DO [facility] 09/03/2023, 10:17 AM

## 2023-09-03 NOTE — Patient Instructions (Addendum)
Thank you for coming to the office today.  Recent Labs    02/20/23 0949 08/22/23 0822  HGBA1C 5.4 6.8*   Goal to limit excess carb starches sugars.  Okay to take the Tootsie pops to help keep quitting smoking. But cautious with everything else.  Goal < 6.5 to not be in diabetic range.  We can recheck thyroid as well.   DUE for FASTING BLOOD WORK (no food or drink after midnight before the lab appointment, only water or coffee without cream/sugar on the morning of)  SCHEDULE "Lab Only" visit in the morning at the clinic for lab draw in 6 MONTHS   - Make sure Lab Only appointment is at about 1 week before your next appointment, so that results will be available  For Lab Results, once available within 2-3 days of blood draw, you can can log in to MyChart online to view your results and a brief explanation. Also, we can discuss results at next follow-up visit.   Please schedule a Follow-up Appointment to: Return in about 6 months (around 03/02/2024) for 6 month fasting lab only then 1 week later Follow-up PreDM, Thyroid, HTN.  If you have any other questions or concerns, please feel free to call the office or send a message through MyChart. You may also schedule an earlier appointment if necessary.  Additionally, you may be receiving a survey about your experience at our office within a few days to 1 week by e-mail or mail. We value your feedback.  Saralyn Pilar, DO [facility], [place]

## 2023-09-11 ENCOUNTER — Ambulatory Visit
Admission: RE | Admit: 2023-09-11 | Discharge: 2023-09-11 | Disposition: A | Discharge status: Home / Self Care | Payer: Medicare Other | Source: Ambulatory Visit | Attending: Acute Care | Admitting: Acute Care

## 2023-09-11 DIAGNOSIS — F17210 Nicotine dependence, cigarettes, uncomplicated: Secondary | ICD-10-CM | POA: Diagnosis not present

## 2023-09-11 DIAGNOSIS — Z122 Encounter for screening for malignant neoplasm of respiratory organs: Secondary | ICD-10-CM | POA: Diagnosis not present

## 2023-09-11 DIAGNOSIS — Z87891 Personal history of nicotine dependence: Secondary | ICD-10-CM | POA: Diagnosis not present

## 2023-09-26 LAB — COLOGUARD

## 2023-10-07 ENCOUNTER — Other Ambulatory Visit: Payer: Self-pay

## 2023-10-07 DIAGNOSIS — Z122 Encounter for screening for malignant neoplasm of respiratory organs: Secondary | ICD-10-CM

## 2023-10-07 DIAGNOSIS — F17210 Nicotine dependence, cigarettes, uncomplicated: Secondary | ICD-10-CM

## 2023-10-07 DIAGNOSIS — Z87891 Personal history of nicotine dependence: Secondary | ICD-10-CM

## 2023-11-15 DIAGNOSIS — M1712 Unilateral primary osteoarthritis, left knee: Secondary | ICD-10-CM | POA: Diagnosis not present

## 2023-11-15 DIAGNOSIS — Z96652 Presence of left artificial knee joint: Secondary | ICD-10-CM | POA: Diagnosis not present

## 2023-12-24 DIAGNOSIS — Z1211 Encounter for screening for malignant neoplasm of colon: Secondary | ICD-10-CM | POA: Diagnosis not present

## 2023-12-28 LAB — COLOGUARD: COLOGUARD: NEGATIVE

## 2024-03-02 ENCOUNTER — Other Ambulatory Visit: Payer: Self-pay

## 2024-03-02 DIAGNOSIS — N138 Other obstructive and reflux uropathy: Secondary | ICD-10-CM

## 2024-03-02 DIAGNOSIS — R7303 Prediabetes: Secondary | ICD-10-CM | POA: Diagnosis not present

## 2024-03-02 DIAGNOSIS — E7800 Pure hypercholesterolemia, unspecified: Secondary | ICD-10-CM

## 2024-03-02 DIAGNOSIS — R7989 Other specified abnormal findings of blood chemistry: Secondary | ICD-10-CM | POA: Diagnosis not present

## 2024-03-03 LAB — HEMOGLOBIN A1C
Hgb A1c MFr Bld: 6.7 % — ABNORMAL HIGH (ref <5.7)
Mean Plasma Glucose: 146 mg/dL
eAG (mmol/L): 8.1 mmol/L

## 2024-03-03 LAB — URINALYSIS, ROUTINE W REFLEX MICROSCOPIC
Bilirubin Urine: NEGATIVE
Glucose, UA: NEGATIVE
Hgb urine dipstick: NEGATIVE
Ketones, ur: NEGATIVE
Leukocytes,Ua: NEGATIVE
Nitrite: NEGATIVE
Protein, ur: NEGATIVE
Specific Gravity, Urine: 1.005 (ref 1.001–1.035)
pH: 6.5 (ref 5.0–8.0)

## 2024-03-03 LAB — T4, FREE: Free T4: 1 ng/dL (ref 0.8–1.8)

## 2024-03-03 LAB — TSH: TSH: 4.12 m[IU]/L (ref 0.40–4.50)

## 2024-03-09 ENCOUNTER — Other Ambulatory Visit: Payer: Self-pay | Admitting: Family Medicine

## 2024-03-09 ENCOUNTER — Ambulatory Visit: Payer: Self-pay | Admitting: Family Medicine

## 2024-03-09 ENCOUNTER — Encounter: Payer: Self-pay | Admitting: Family Medicine

## 2024-03-09 VITALS — BP 122/78 | HR 91 | Ht 73.5 in | Wt 264.2 lb

## 2024-03-09 DIAGNOSIS — R7989 Other specified abnormal findings of blood chemistry: Secondary | ICD-10-CM

## 2024-03-09 DIAGNOSIS — M65342 Trigger finger, left ring finger: Secondary | ICD-10-CM

## 2024-03-09 DIAGNOSIS — N138 Other obstructive and reflux uropathy: Secondary | ICD-10-CM | POA: Diagnosis not present

## 2024-03-09 DIAGNOSIS — N401 Enlarged prostate with lower urinary tract symptoms: Secondary | ICD-10-CM

## 2024-03-09 DIAGNOSIS — J432 Centrilobular emphysema: Secondary | ICD-10-CM | POA: Diagnosis not present

## 2024-03-09 DIAGNOSIS — E1169 Type 2 diabetes mellitus with other specified complication: Secondary | ICD-10-CM

## 2024-03-09 DIAGNOSIS — Z0000 Encounter for general adult medical examination without abnormal findings: Secondary | ICD-10-CM

## 2024-03-09 DIAGNOSIS — I10 Essential (primary) hypertension: Secondary | ICD-10-CM

## 2024-03-09 DIAGNOSIS — E7800 Pure hypercholesterolemia, unspecified: Secondary | ICD-10-CM

## 2024-03-09 DIAGNOSIS — R7303 Prediabetes: Secondary | ICD-10-CM

## 2024-03-09 NOTE — Progress Notes (Signed)
 poc

## 2024-03-09 NOTE — Patient Instructions (Addendum)
 Thank you for coming to the office today.  Recent Labs    08/22/23 0822 03/02/24 0911 03/09/24 1002  HGBA1C 6.8* 6.7* 6.6*   Recommend annual diabetic eye exam with Dr Alto Atta  Keep on current meds  Take cholesterol med every other day.  Keep an eye on area for R inguinal hernia, if it does not improve or get worse we can refer.  DUE for FASTING BLOOD WORK (no food or drink after midnight before the lab appointment, only water or coffee without cream/sugar on the morning of)  SCHEDULE "Lab Only" visit in the morning at the clinic for lab draw in 6  - Make sure Lab Only appointment is at about 1 week before your next appointment, so that results will be available  For Lab Results, once available within 2-3 days of blood draw, you can can log in to MyChart online to view your results and a brief explanation. Also, we can discuss results at next follow-up visit.   Please schedule a Follow-up Appointment to: Return for 6 month fasting lab > 1 week later Annual Physical.  If you have any other questions or concerns, please feel free to call the office or send a message through MyChart. You may also schedule an earlier appointment if necessary.  Additionally, you may be receiving a survey about your experience at our office within a few days to 1 week by e-mail or mail. We value your feedback.  Domingo Friend, DO [facility], [place]

## 2024-03-09 NOTE — Progress Notes (Signed)
 Subjective:    Patient ID: Jesse Gardner, male    DOB: [birth year], 78 y.o.   MRN: [id number]  Jesse Gardner is a 78 y.o. male presenting on 03/09/2024 for Diabetes   HPI  Discussed the use of AI scribe software for clinical note transcription with the patient, who gave verbal consent to proceed.  History of Present Illness   Jesse Gardner is a 78 year old male with early diabetes who presents for a follow-up on his blood sugar levels and overall health management.  Type 2 Diabetes, new diagnosis His recent blood sugar level was 6.6-6.7 showing slight improvement. He attends the gym five to six days a week, using the treadmill for 30 to 45 minutes. He occasionally indulges in dietary treats like ice cream and bread but is making efforts to reduce carbohydrate intake. He is mindful of his wine consumption, which is a sweet variety.  He takes medication for cholesterol every other day due to uncertainty about his supply. His last cholesterol reading in October was in the 130s. He uses a pill organizer to manage his medications but sometimes forgets to take them.  He experiences occasional numbness or tingling in his feet, though not severe. He has an eye doctor, Dr. Alto Atta, and plans to inform him about his diabetes diagnosis. He is due for an eye check-up, which he believes should occur annually.  He describes a 'trigger finger' issue with his left ring finger, which occasionally locks up when making a fist. He is not currently seeking treatment.  He reports new discomfort in his lower R groin, suspecting a possible hernia, distinct from a previous hernia repair done over ten years ago. He attributes this to a new leg machine at the gym and is monitoring the situation, noting soreness in the area.  His diet includes fruits with no added sugar, raisin bread, Cheerios, and grits. He is aware of the need to limit certain foods like minute rice and high-sodium meals and is trying to balance his  diet with healthier options.       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 Not drinking soft drinks. - Exercise: No longer going to the gym. Limited exercise Denies CP, dyspnea, HA, edema, dizziness / lightheadedness   HYPERLIPIDEMIA: Last lipid panel 07/2023 LDL 134 improved. Intermittent dosing on Rosuvastatin  10mg  TWICE a week, he has side effect diarrhea He has increased to every OTHER day   Allergic Rhinosinusitis / Eustachian Tube Dysfunction Chronic issue impacting him. - Taking Flonase   He takes Claritin D occasionally or Sudafed PRN Needs re order Atrovent  PRN nasal   Aortic Atherosclerosis On CT imaging last 12/2020 On intermittent statin Active smoker ASA 81   Centrilobular Emphysema Tobacco Abuse   Osteoarthritis bilateral Knees Chronic Knee Pain He has followed w/ Dr Daun Epstein [facility] S/p L TKR, still soreness in L Knee - Improved overall R TKR        03/09/2024    9:54 AM 09/03/2023   10:03 AM 02/20/2023    9:41 AM  Depression screen PHQ 2/9  Decreased Interest 0 0 0  Down, Depressed, Hopeless 0 0 0  PHQ - 2 Score 0 0 0  Altered sleeping 1 1   Tired, decreased energy 0  0   Change in appetite 0 0   Feeling bad or failure about yourself  0 0   Trouble concentrating 0 0   Moving slowly or fidgety/restless 0 0   Suicidal thoughts  0   PHQ-9 Score 1 1   Difficult doing work/chores Not difficult at all Not difficult at all        09/03/2023   10:03 AM 02/20/2023    9:41 AM 08/21/2022    9:55 AM 11/15/2021    9:19 AM  GAD 7 : Generalized Anxiety Score  Nervous, Anxious, on Edge 0 0 0 0  Control/stop worrying 0 0 0 0  Worry too much - different things 0 0 0 0  Trouble relaxing 0 1 0 0  Restless 0 0 0 0  Easily  annoyed or irritable 1 1 0 0  Afraid - awful might happen 1 0 0 0  Total GAD 7 Score 2 2 0 0  Anxiety Difficulty   Not difficult at all Not difficult at all    Social History   Tobacco Use   Smoking status: Every Day    Current packs/day: 0.50    Average packs/day: 0.5 packs/day for 57.0 years (28.5 ttl pk-yrs)    Types: Cigarettes   Smokeless tobacco: Never   Tobacco comments:    1 cigarette in the last month   Vaping Use   Vaping status: Never Used  Substance Use Topics   Alcohol use: Yes    Alcohol/week: 2.0 standard drinks of alcohol    Types: 2 Glasses of wine per week   Drug use: Never    Review of Systems Per HPI unless specifically indicated above     Objective:     BP 122/78 (BP Location: Right Arm, Patient Position: Sitting, Cuff Size: Large)   Pulse 91   Ht 6' 1.5" (1.867 m)   Wt 264 lb 4 oz (119.9 kg)   SpO2 99%   BMI 34.39 kg/m   Wt Readings from Last 3 Encounters:  03/09/24 264 lb 4 oz (119.9 kg)  09/11/23 260 lb (117.9 kg)  09/03/23 261 lb (118.4 kg)    Physical Exam Vitals and nursing note reviewed.  Constitutional:      General: He is not in acute distress.    Appearance: Normal appearance. He is well-developed. He is not diaphoretic.     Comments: Well-appearing, comfortable, cooperative  HENT:     Head: Normocephalic and atraumatic.  Eyes:     General:        Right eye: No discharge.        Left eye: No discharge.     Conjunctiva/sclera: Conjunctivae normal.  Cardiovascular:     Rate and Rhythm: Normal rate.  Pulmonary:     Effort: Pulmonary effort is normal.  Abdominal:     Hernia: A hernia (R Inguinal mild hernia on exam, difficult to reproduce today) is present.  Skin:    General: Skin is warm and dry.     Findings: No erythema or rash.  Neurological:     Mental Status: He is alert and oriented to person, place, and time.  Psychiatric:        Mood and Affect: Mood normal.        Behavior: Behavior normal.        Thought  Content: Thought content normal.     Comments: Well groomed, good eye contact, normal speech and thoughts     Diabetic Foot Exam - Simple  Simple Foot Form Diabetic Foot exam was performed with the following findings: Yes 03/09/2024 10:10 AM  Visual Inspection No deformities, no ulcerations, no other skin breakdown bilaterally: Yes Sensation Testing Intact to touch and monofilament testing bilaterally: Yes Pulse Check Posterior Tibialis and Dorsalis pulse intact bilaterally: Yes Comments      Results for orders placed or performed in visit on 03/09/24  POCT HgB A1C   Collection Time: 03/09/24 10:02 AM  Result Value Ref Range   Hemoglobin A1C 6.6 (A) 4.0 - 5.6 %   HbA1c POC (<> result, manual entry)     HbA1c, POC (prediabetic range)     HbA1c, POC (controlled diabetic range)        Assessment & Plan:   Problem List Items Addressed This Visit     BPH with obstruction/lower urinary tract symptoms   Centrilobular emphysema (HCC)   Type 2 diabetes mellitus with other specified complication (HCC) - Primary   Other Visit Diagnoses       Acquired trigger finger of left ring finger            Type 2 diabetes mellitus without complications Recently diagnosed, mild, controlled with diet. Prior PreDiabetes range now elevated Emphasized dietary modifications and monitoring for complications. No medication required. - Advise dietary modifications to reduce sugar and carbohydrate intake. - Monitor blood glucose levels regularly. - Defer meds today - Schedule annual ophthalmologic examination with Dr. Alto Atta for DM Eye - Foot check - Future Urine micro - Plan follow-up blood panel in October to reassess glycemic control.  Hyperlipidemia Previously managed with medication. Current cholesterol levels well-managed. Adjusted medication schedule for adherence. - Continue medication regimen every other day. - Order medication refill as needed. - Monitor lipid profile with next  blood panel in October.  Possible inguinal hernia R side Localized lower abdominal pain suggests possible inguinal hernia. No immediate surgical intervention required. Known prior history of hernia in groin s/p repair in past years ago - Advise to avoid heavy lifting and monitor symptoms. - Consider surgical referral if symptoms worsen.  Trigger finger, left ring finger Occasional locking and discomfort in left ring finger. Not severe enough for surgical intervention or corticosteroid injection. - Monitor symptoms and consider referral to orthopedic hand specialist if condition worsens.        Orders Placed This Encounter  Procedures   POCT HgB A1C    No orders of the defined types were placed in this encounter.   Follow up plan: Return for 6 month fasting lab > 1 week later Annual Physical.  Future labs ordered for 09/10/24 add urine microalbumin   Domingo Friend, DO [facility] 03/09/2024, 10:05 AM

## 2024-03-13 ENCOUNTER — Ambulatory Visit

## 2024-04-23 ENCOUNTER — Telehealth: Payer: Self-pay | Admitting: Family Medicine

## 2024-04-23 NOTE — Telephone Encounter (Signed)
 LVM 04/23/2024 to schedule AWV. Please schedule office or virtual visits.  Jesse Gardner; Care Guide Ambulatory Clinical Support [address] [place] [facility] Direct Dial: [phone]

## 2024-05-25 DIAGNOSIS — M25542 Pain in joints of left hand: Secondary | ICD-10-CM | POA: Diagnosis not present

## 2024-05-25 DIAGNOSIS — M65342 Trigger finger, left ring finger: Secondary | ICD-10-CM | POA: Diagnosis not present

## 2024-06-09 DIAGNOSIS — M65342 Trigger finger, left ring finger: Secondary | ICD-10-CM | POA: Diagnosis not present

## 2024-06-22 ENCOUNTER — Ambulatory Visit: Payer: Self-pay

## 2024-06-22 NOTE — Telephone Encounter (Signed)
 FYI Only or Action Required?: Action required by provider: Patient requesting prescription - see notes.  Patient was last seen in primary care on 03/09/2024 by Jesse Gardner PARAS, DO.  Called Nurse Triage reporting Sinusitis.  Symptoms began several days ago.  Interventions attempted: OTC medications: Advil.  Symptoms are: gradually worsening.  Triage Disposition: Home Care  Patient/caregiver understands and will follow disposition?: Yes   Sinus infection, bad sinus headache, has had headache for 3 days. Requesting antibiotic, [phone]  Taken Advil Reason for Disposition  [1] Sinus congestion as part of a cold AND [2] present < 10 days  Answer Assessment - Initial Assessment Questions Denies cough  Patient informed his symptoms are advisable for home care at this time. Patient is requesting a prescription, stating he used to get antibiotics for his yearly illness. He would like antibiotics sent to [facility] in [place].    1. LOCATION: Where does it hurt?      Sinus area around nose, eyes, and top of forehead 2. ONSET: When did the sinus pain start?  (e.g., hours, days)      3 days ago 3. SEVERITY: How bad is the pain?   (Scale 0-10; or none, mild, moderate or severe)     7 5. NASAL CONGESTION: Is the nose blocked? If Yes, ask: Can you open it or must you breathe through your mouth?     Yes nasal congestion 6. NASAL DISCHARGE: Do you have discharge from your nose? If so ask, What color?     Patient states he always has sinus trouble 7. FEVER: Do you have a fever? If Yes, ask: What is it, how was it measured, and when did it start?      Doesn't have a thermometer but doesn't feel he is running a fever 8. OTHER SYMPTOMS: Do you have any other symptoms? (e.g., sore throat, cough, earache, difficulty breathing) Slight congestion that causes difficulty breathing  Protocols used: Sinus Pain or Congestion-A-AH

## 2024-06-23 ENCOUNTER — Ambulatory Visit (INDEPENDENT_AMBULATORY_CARE_PROVIDER_SITE_OTHER)

## 2024-06-23 VITALS — BP 130/70 | HR 93 | Ht 73.0 in | Wt 257.4 lb

## 2024-06-23 DIAGNOSIS — E1169 Type 2 diabetes mellitus with other specified complication: Secondary | ICD-10-CM

## 2024-06-23 DIAGNOSIS — J3089 Other allergic rhinitis: Secondary | ICD-10-CM

## 2024-06-23 MED ORDER — FLUTICASONE PROPIONATE 50 MCG/ACT NA SUSP: 2.0000 | Freq: Every day | NASAL | 2 refills | Status: DC | PRN

## 2024-06-23 NOTE — Progress Notes (Signed)
 Acute Patient Visit  Physician: Tamu Golz A Kevontae Burgoon, MD  Patient: Jesse Gardner MRN: [id number] DOB: 1946-05-28 PCP: Edman Marsa PARAS, DO     Subjective:   Chief Complaint  Patient presents with   Sinusitis    Constant ache in face due to sinus pressure      HPI: The patient is a 78 y.o. male who presents today for:   Discussed the use of AI scribe software for clinical note transcription with the patient, who gave verbal consent to proceed.  History of Present Illness   Jesse Gardner is a 78 year old male with a history of sinus infections who presents with symptoms of a sinus infection.  Sinonasal symptoms - Facial pressure and fullness over the sinuses for three days - Yellowish non-purulent - No fever or chills - No regular cough or shortness of breath - No significant allergies  Symptom management - Uses nasal spray and took Sudafed with some relief - Hot showers help alleviate symptoms - Has not used nasal saline irrigation  Metabolic status - Borderline diabetic, not on insulin - he thinks he is in pre-diabetic range - but last HgbA1c 6.7/6.8 - Blood sugar levels not excessively high           ROS:   As noted in the HPI    ASSESMENT/PLAN:  Encounter Diagnoses  Name Primary?   Seasonal allergic rhinitis due to other allergic trigger Yes   Type 2 diabetes mellitus with other specified complication, without long-term current use of insulin (HCC)     No orders of the defined types were placed in this encounter.   Assessment and Plan    Acute sinusitis Symptoms suggest a likely viral etiology given the short duration and absence of severe symptoms. No current indication for antibiotics unless symptoms worsen or persist beyond ten days. Sudafed can be used if no side effects occur - Prescribe nasal spray for symptomatic relief. - Advise use of saline nasal rinse to flush sinuses. - Recommend ibuprofen or Aleve for inflammation and  pressure relief. - Instruct to monitor symptoms and contact the office if symptoms worsen or persist beyond ten days.    Fu with PCP prn  OBJECTIVE: Vitals:   06/23/24 0941  BP: 130/70  Pulse: 93  SpO2: 99%  Weight: 257 lb 6 oz (116.7 kg)  Height: 6' 1 (1.854 m)    Body mass index is 33.96 kg/m.   Physical Exam Vitals reviewed.  Constitutional:      Appearance: Normal appearance. Well-developed with normal weight.  Cardiovascular:     Rate and Rhythm: Normal rate and regular rhythm. Normal heart sounds. Normal peripheral pulses Pulmonary:     Normal breath sounds with normal effort Skin:    General: Skin is warm and dry without noticeable rash. Neurological:     General: No focal deficit present.  Psychiatric:        Mood and Affect: Mood, behavior and cognition normal       Allergies Patient has no known allergies.  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  Family History Pateint's family history includes Alzheimer's disease in his mother; Diabetes in  his father; Prostate cancer in his brother.  Social History Patient  reports that he has been smoking cigarettes. He has a 28.5 pack-year smoking history. He has never used smokeless tobacco. He reports current alcohol use of about 2.0 standard drinks of alcohol per week. He reports that he does not use drugs.    06/23/2024

## 2024-07-01 DIAGNOSIS — M65342 Trigger finger, left ring finger: Secondary | ICD-10-CM | POA: Diagnosis not present

## 2024-08-17 ENCOUNTER — Encounter: Payer: Self-pay | Admitting: Acute Care

## 2024-09-10 ENCOUNTER — Other Ambulatory Visit

## 2024-09-10 DIAGNOSIS — E1169 Type 2 diabetes mellitus with other specified complication: Secondary | ICD-10-CM

## 2024-09-10 DIAGNOSIS — J432 Centrilobular emphysema: Secondary | ICD-10-CM

## 2024-09-10 DIAGNOSIS — N138 Other obstructive and reflux uropathy: Secondary | ICD-10-CM

## 2024-09-10 DIAGNOSIS — R7989 Other specified abnormal findings of blood chemistry: Secondary | ICD-10-CM

## 2024-09-10 DIAGNOSIS — Z0000 Encounter for general adult medical examination without abnormal findings: Secondary | ICD-10-CM

## 2024-09-10 DIAGNOSIS — E7800 Pure hypercholesterolemia, unspecified: Secondary | ICD-10-CM

## 2024-09-10 DIAGNOSIS — I10 Essential (primary) hypertension: Secondary | ICD-10-CM

## 2024-09-11 LAB — PSA: PSA: 2.58 ng/mL (ref <=4.00)

## 2024-09-11 LAB — COMPREHENSIVE METABOLIC PANEL WITH GFR
AG Ratio: 1.6 (calc) (ref 1.0–2.5)
ALT: 19 U/L (ref 9–46)
AST: 19 U/L (ref 10–35)
Albumin: 4.6 g/dL (ref 3.6–5.1)
Alkaline phosphatase (APISO): 54 U/L (ref 35–144)
BUN: 16 mg/dL (ref 7–25)
CO2: 28 mmol/L (ref 20–32)
Calcium: 9.8 mg/dL (ref 8.6–10.3)
Chloride: 102 mmol/L (ref 98–110)
Creat: 0.84 mg/dL (ref 0.70–1.28)
Globulin: 2.8 g/dL (ref 1.9–3.7)
Glucose, Bld: 112 mg/dL — ABNORMAL HIGH (ref 65–99)
Potassium: 4.4 mmol/L (ref 3.5–5.3)
Sodium: 139 mmol/L (ref 135–146)
Total Bilirubin: 0.5 mg/dL (ref 0.2–1.2)
Total Protein: 7.4 g/dL (ref 6.1–8.1)
eGFR: 89 mL/min/1.73m2 (ref >=60)

## 2024-09-11 LAB — CBC WITH DIFFERENTIAL/PLATELET
Absolute Lymphocytes: 4141 {cells}/uL — ABNORMAL HIGH (ref 850–3900)
Absolute Monocytes: 959 {cells}/uL — ABNORMAL HIGH (ref 200–950)
Basophils Absolute: 31 {cells}/uL (ref 0–200)
Basophils Relative: 0.3 %
Eosinophils Absolute: 398 {cells}/uL (ref 15–500)
Eosinophils Relative: 3.9 %
HCT: 42.9 % (ref 38.5–50.0)
Hemoglobin: 14 g/dL (ref 13.2–17.1)
MCH: 28.5 pg (ref 27.0–33.0)
MCHC: 32.6 g/dL (ref 32.0–36.0)
MCV: 87.2 fL (ref 80.0–100.0)
MPV: 12.4 fL (ref 7.5–12.5)
Monocytes Relative: 9.4 %
Neutro Abs: 4672 {cells}/uL (ref 1500–7800)
Neutrophils Relative %: 45.8 %
Platelets: 251 Thousand/uL (ref 140–400)
RBC: 4.92 Million/uL (ref 4.20–5.80)
RDW: 13.3 % (ref 11.0–15.0)
Total Lymphocyte: 40.6 %
WBC: 10.2 Thousand/uL (ref 3.8–10.8)

## 2024-09-11 LAB — HEMOGLOBIN A1C
Hgb A1c MFr Bld: 6.4 % — ABNORMAL HIGH (ref <5.7)
Mean Plasma Glucose: 137 mg/dL
eAG (mmol/L): 7.6 mmol/L

## 2024-09-11 LAB — TSH: TSH: 3.42 m[IU]/L (ref 0.40–4.50)

## 2024-09-11 LAB — LIPID PANEL
Cholesterol: 195 mg/dL (ref <200)
HDL: 43 mg/dL (ref >=40)
LDL Cholesterol (Calc): 130 mg/dL — ABNORMAL HIGH
Non-HDL Cholesterol (Calc): 152 mg/dL — ABNORMAL HIGH (ref <130)
Total CHOL/HDL Ratio: 4.5 (calc) (ref <5.0)
Triglycerides: 110 mg/dL (ref <150)

## 2024-09-11 LAB — MICROALBUMIN / CREATININE URINE RATIO
Creatinine, Urine: 71 mg/dL (ref 20–320)
Microalb Creat Ratio: 3 mg/g{creat} (ref <30)
Microalb, Ur: 0.2 mg/dL

## 2024-09-14 NOTE — Progress Notes (Signed)
 Dannel Rafter                                          MRN: [id number]   09/14/2024   The [facility] Quality Team Specialist reviewed this patient medical record for the purposes of chart review for care gap closure. The following were reviewed: abstraction for care gap closure-kidney health evaluation for diabetes:eGFR  and uACR.    [facility] Quality Team

## 2024-09-18 ENCOUNTER — Other Ambulatory Visit: Payer: Self-pay

## 2024-09-18 ENCOUNTER — Encounter: Admitting: Family Medicine

## 2024-09-18 DIAGNOSIS — J3089 Other allergic rhinitis: Secondary | ICD-10-CM

## 2024-09-28 ENCOUNTER — Encounter: Payer: Self-pay | Admitting: Family Medicine

## 2024-09-28 ENCOUNTER — Ambulatory Visit: Admitting: Family Medicine

## 2024-09-28 VITALS — BP 132/68 | HR 103 | Ht 73.0 in | Wt 264.5 lb

## 2024-09-28 DIAGNOSIS — I700 Atherosclerosis of aorta: Secondary | ICD-10-CM

## 2024-09-28 DIAGNOSIS — E1169 Type 2 diabetes mellitus with other specified complication: Secondary | ICD-10-CM

## 2024-09-28 DIAGNOSIS — Z23 Encounter for immunization: Secondary | ICD-10-CM | POA: Diagnosis not present

## 2024-09-28 DIAGNOSIS — Z0000 Encounter for general adult medical examination without abnormal findings: Secondary | ICD-10-CM

## 2024-09-28 DIAGNOSIS — E66811 Obesity, class 1: Secondary | ICD-10-CM

## 2024-09-28 DIAGNOSIS — I10 Essential (primary) hypertension: Secondary | ICD-10-CM

## 2024-09-28 DIAGNOSIS — N138 Other obstructive and reflux uropathy: Secondary | ICD-10-CM

## 2024-09-28 DIAGNOSIS — N401 Enlarged prostate with lower urinary tract symptoms: Secondary | ICD-10-CM | POA: Diagnosis not present

## 2024-09-28 DIAGNOSIS — E7800 Pure hypercholesterolemia, unspecified: Secondary | ICD-10-CM

## 2024-09-28 DIAGNOSIS — I2510 Atherosclerotic heart disease of native coronary artery without angina pectoris: Secondary | ICD-10-CM

## 2024-09-28 DIAGNOSIS — Z720 Tobacco use: Secondary | ICD-10-CM

## 2024-09-28 DIAGNOSIS — J432 Centrilobular emphysema: Secondary | ICD-10-CM

## 2024-09-28 MED ORDER — LOSARTAN POTASSIUM 50 MG PO TABS: 50.0000 mg | ORAL_TABLET | Freq: Every day | ORAL | 3 refills | Status: AC

## 2024-09-28 MED ORDER — ROSUVASTATIN CALCIUM 10 MG PO TABS: 10.0000 mg | ORAL_TABLET | ORAL | 3 refills | Status: DC

## 2024-09-28 NOTE — Progress Notes (Signed)
 Subjective:    Patient ID: Jesse Gardner, male    DOB: 1946/08/02, 78 y.o.   MRN: [id number]  Jesse Gardner is a 78 y.o. male presenting on 09/28/2024 for Annual Exam   HPI Discussed the use of AI scribe software for clinical note transcription with the patient, who gave verbal consent to proceed.  History of Present Illness   Jesse Gardner is a 78 year old male who presents for an annual physical exam.  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 - Occasional chest pain, typically right-sided, associated with physical strain from gym workouts - Blood pressure generally within normal range; occasional elevated readings when a dose of losartan  is missed - Current antihypertensive regimen includes losartan  taken regularly  Type 2 Diabetes Glycemic control and dietary modifications - Hemoglobin A1c improved to 6.4 - Active dietary modifications include reducing intake of potatoes, bread, and rice, and increasing consumption of baked sweet potatoes - Limiting salt intake as part of dietary management  Exercise tolerance and physical activity - Daily exercise routine except Sundays, focusing on weight lifting and treadmill use - Shortness of breath during specific exercises, such as leg crunches, but not during other activities  Peripheral neuropathy symptoms - Occasional tingling and burning sensations in the left foot - No significant swelling in the lower extremities   Essential Hypertension / BMI >34 Home BP has been in normal range mostly occasional some elevations home BP cuff Current Meds - Losartan 50mg daily (ran out for few days to week, he got re order recently) Lifestyle: - Diet: Drinks up to pot of coffee daily, drinks tea. Not drinking soft drinks. - Exercise: No longer going to the  gym. Limited exercise Denies CP, dyspnea, HA, edema, dizziness / lightheadedness   HYPERLIPIDEMIA: Last lipid panel 08/2024 LDL 130 improved. Intermittent dosing on Rosuvastatin 10mg every other day   Allergic Rhinosinusitis / Eustachian Tube Dysfunction Chronic issue impacting him. - Taking Flonase  He takes Claritin D occasionally or Sudafed PRN Needs re order Atrovent PRN nasal   Aortic Atherosclerosis On CT imaging last 12/2020 On intermittent statin Active smoker ASA 81   Centrilobular Emphysema Tobacco Abuse   Osteoarthritis bilateral Knees Chronic Knee Pain He has followed w/ Dr Poggi - [facility] S/p L TKR, still soreness in L Knee - Improved overall R TKR   Health Maintenance:  Flu Shot today  Prevnar 20 updated  PSA 2.58 negative     09/28/2024    8:34 AM 06/23/2024   10:04 AM 03/09/2024    9:54 AM  Depression screen PHQ 2/9  Decreased Interest 0 0 0  Down, Depressed, Hopeless 0 0 0  PHQ - 2 Score 0 0 0  Altered sleeping  0 1  Tired, decreased energy  0 0  Change in appetite  0 0  Feeling bad or failure about yourself   0 0  Trouble concentrating  0 0  Moving slowly or fidgety/restless  0 0  Suicidal thoughts  0   PHQ-9 Score  0  1   Difficult doing work/chores   Not difficult at all     Data saved with a previous flowsheet row definition       12 /10/2023    8:34 AM 06/23/2024   10:04 AM 09/03/2023   10:03 AM 02/20/2023    9:41 AM  GAD 7 : Generalized Anxiety  Score  Nervous, Anxious, on Edge 0 0 0 0  Control/stop worrying 0 0 0 0  Worry too much - different things 0 0 0 0  Trouble relaxing 0 0 0 1  Restless 0 0 0 0  Easily annoyed or irritable 0 0 1 1  Afraid - awful might happen 0 0 1 0  Total GAD 7 Score 0 0 2 2     Past Medical History:  Diagnosis Date   Aortic atherosclerosis    Arthritis    Asthma    patient reports as a child   COPD (chronic obstructive pulmonary disease) (HCC)    Dyspnea    Hyperlipidemia     Hypertension, essential    Osteoarthritis of both knees    Pre-diabetes    Renal abscess, 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   Past Surgical History:  Procedure Laterality Date   APPENDECTOMY     COLONOSCOPY     HERNIA REPAIR Right    inguinal   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   Procedure: TOTAL KNEE ARTHROPLASTY;  Surgeon: Edie Norleen PARAS, MD;  Location: [facility] OR[facility];  Service: Orthopedics;  Laterality: Right;   TOTAL KNEE ARTHROPLASTY Left 11/13/2022   Procedure: TOTAL KNEE ARTHROPLASTY;  Surgeon: Edie Norleen PARAS, MD;  Location: [facility] OR[facility];  Service: Orthopedics;  Laterality: Left;   Social History   Socioeconomic History   Marital status: Widowed    Spouse name: Not on file   Number of children: 2   Years of education: Not on file   Highest education level: Not on file  Occupational History   Occupation: retired  Tobacco Use   Smoking status: Every Day    Current packs/day: 0.50    Average packs/day: 0.5 packs/day for 57.0 years (28.5 ttl pk-yrs)    Types: Cigarettes   Smokeless tobacco: Never   Tobacco comments:    1 cigarette in the last month   Vaping Use   Vaping status: Never Used  Substance and Sexual Activity   Alcohol use: Yes    Alcohol/week: 2.0 standard drinks of alcohol    Types: 2 Glasses of wine per week   Drug use: Never   Sexual activity: Not on file  Other Topics Concern   Not on file  Social History Narrative   Lives alone   Social Drivers of Health   Financial Resource Strain: Low Risk  (07/03/2024)   Received [facility]   Overall Financial Resource Strain (CARDIA)    Difficulty of Paying Living Expenses: Not hard at all  Food Insecurity: No Food Insecurity (07/03/2024)   Received from [facility]   Hunger Vital Sign    Within the past 12 months, you worried that your food would run out before you got the money to buy more.: Never true    Within the past 12 months, the food you bought just didn't last and you didn't have  money to get more.: Never true  Transportation Needs: No Transportation Needs (07/03/2024)   Received from [facility] - Transportation    In the past 12 months, has lack of transportation kept you from medical appointments or from getting medications?: No    Lack of Transportation (Non-Medical): No  Physical Activity: Insufficiently Active (11/09/2022)   Exercise Vital Sign    Days of Exercise per Week: 2 days    Minutes of Exercise per Session: 20 min  Stress: No Stress Concern Present (11/09/2022)   [employer] - Occupational Stress Questionnaire    Feeling of Stress : Not at all  Social Connections: Moderately Isolated (11/09/2022)   Social Connection and Isolation Panel    Frequency of Communication with Friends and Family: Three times a week    Frequency of Social Gatherings with Friends and Family: Twice a week    Attends Religious Services: 1 to 4 times per year    Active Member of [employer] or Organizations: No    Attends Banker Meetings: Never    Marital Status: Widowed  Intimate Partner Violence: Not At Risk (11/09/2022)   Humiliation, Afraid, Rape, and Kick questionnaire    Fear of Current or Ex-Partner: No    Emotionally Abused: No    Physically Abused: No    Sexually Abused: No   Family History  Problem Relation Age of Onset   Alzheimer's disease Mother    Diabetes Father    Prostate cancer Brother    Current Outpatient Medications on File Prior to Visit  Medication Sig   aspirin  EC 81 MG tablet Take 1 tablet (81 mg total) by mouth daily. Swallow whole.   fluticasone  (FLONASE ) 50 MCG/ACT nasal spray Place 2 sprays into both nostrils daily as needed.   Melatonin 5 MG CAPS Take 5 mg by mouth at bedtime as needed (sleep).   No current facility-administered medications on file prior to visit.    Review of Systems  Constitutional:  Negative for activity change, appetite change, chills, diaphoresis,  fatigue and fever.  HENT:  Positive for hearing loss (forgot his hearing aids). Negative for congestion.   Eyes:  Negative for visual disturbance.  Respiratory:  Negative for cough, chest tightness, shortness of breath and wheezing.   Cardiovascular:  Negative for chest pain, palpitations and leg swelling.  Gastrointestinal:  Negative for abdominal pain, constipation, diarrhea, nausea and vomiting.  Genitourinary:  Negative for dysuria, frequency and hematuria.  Musculoskeletal:  Negative for arthralgias and neck pain.  Skin:  Negative for rash.  Neurological:  Negative for dizziness, weakness, light-headedness, numbness and headaches.  Hematological:  Negative for adenopathy.  Psychiatric/Behavioral:  Negative for behavioral problems, dysphoric mood and sleep disturbance.    Per HPI unless specifically indicated above     Objective:    BP 132/68 (BP Location: Left Arm, Patient Position: Sitting, Cuff Size: Large)   Pulse (!) 103   Ht 6' 1 (1.854 m)   Wt 264 lb 8 oz (120 kg)   SpO2 98%   BMI 34.90 kg/m   Wt Readings from Last 3 Encounters:  09/28/24 264 lb 8 oz (120 kg)  06/23/24 257 lb 6 oz (116.7 kg)  03/09/24 264 lb 4 oz (119.9 kg)    Physical Exam Vitals and nursing note reviewed.  Constitutional:      General: He is not in acute distress.    Appearance: He is well-developed. He is not diaphoretic.     Comments: Well-appearing, comfortable, cooperative  HENT:     Head: Normocephalic and atraumatic.  Eyes:     General:        Right eye: No discharge.        Left eye: No discharge.     Conjunctiva/sclera: Conjunctivae normal.     Pupils: Pupils are equal, round, and reactive to light.  Neck:     Thyroid : No thyromegaly.  Cardiovascular:     Rate and Rhythm: Normal rate and regular rhythm.     Pulses: Normal pulses.  Heart sounds: Normal heart sounds. No murmur heard. Pulmonary:     Effort: Pulmonary effort is normal. No respiratory distress.     Breath  sounds: Normal breath sounds. No wheezing or rales.  Abdominal:     General: Bowel sounds are normal. There is no distension.     Palpations: Abdomen is soft. There is no mass.     Tenderness: There is no abdominal tenderness.  Musculoskeletal:        General: No tenderness. Normal range of motion.     Cervical back: Normal range of motion and neck supple.     Right lower leg: No edema.     Left lower leg: No edema.     Comments: Upper / Lower Extremities: - Normal muscle tone, strength bilateral upper extremities 5/5, lower extremities 5/5  Lymphadenopathy:     Cervical: No cervical adenopathy.  Skin:    General: Skin is warm and dry.     Findings: No erythema or rash.  Neurological:     Mental Status: He is alert and oriented to person, place, and time.     Comments: Distal sensation intact to light touch all extremities  Psychiatric:        Mood and Affect: Mood normal.        Behavior: Behavior normal.        Thought Content: Thought content normal.     Comments: Well groomed, good eye contact, normal speech and thoughts      I have personally reviewed the radiology report from 09/10/24 on LDCT.  CLINICAL DATA:  78 year old male former smoker with 104.5 pack-year smoking history, quit smoking May 2024   EXAM: CT CHEST WITHOUT CONTRAST LOW-DOSE FOR LUNG CANCER SCREENING   TECHNIQUE: Multidetector CT imaging of the chest was performed following the standard protocol without IV contrast.   RADIATION DOSE REDUCTION: This exam was performed according to the departmental dose-optimization program which includes automated exposure control, adjustment of the mA and/or kV according to patient size and/or use of iterative reconstruction technique.   COMPARISON:  03/14/2022 screening chest CT   FINDINGS: Cardiovascular: Normal heart size. No significant pericardial effusion/thickening. Three-vessel coronary atherosclerosis. Atherosclerotic nonaneurysmal thoracic aorta.  Normal caliber pulmonary arteries.   Mediastinum/Nodes: No significant thyroid  nodules. Unremarkable esophagus. No pathologically enlarged axillary, mediastinal or hilar lymph nodes, noting limited sensitivity for the detection of hilar adenopathy on this noncontrast study.   Lungs/Pleura: No pneumothorax. No pleural effusion. Mild centrilobular emphysema with diffuse bronchial wall thickening. No acute consolidative airspace disease or lung masses. No significant growth of previously visualized pulmonary nodules up to 6.6 mm in the anterior right middle lobe on series 3/image 183. No new significant pulmonary nodules.   Upper abdomen: No acute abnormality.   Musculoskeletal: No aggressive appearing focal osseous lesions. Mild thoracic spondylosis.   IMPRESSION: 1. Lung-RADS 2, benign appearance or behavior. Continue annual screening with low-dose chest CT without contrast in 12 months. 2. Three-vessel coronary atherosclerosis. 3. Aortic Atherosclerosis (ICD10-I70.0) and Emphysema (ICD10-J43.9).     Electronically Signed   By: Selinda DELENA Blue M.D.   On: 10/05/2023 20:30  Results for orders placed or performed in visit on 09/10/24  Comprehensive metabolic panel with GFR   Collection Time: 09/10/24  8:20 AM  Result Value Ref Range   Glucose, Bld 112 (H) 65 - 99 mg/dL   BUN 16 7 - 25 mg/dL   Creat 9.15 9.29 - 8.71 mg/dL   eGFR 89 > OR = 60 fO/fpw/8.26f7  BUN/Creatinine Ratio SEE NOTE: 6 - 22 (calc)   Sodium 139 135 - 146 mmol/L   Potassium 4.4 3.5 - 5.3 mmol/L   Chloride 102 98 - 110 mmol/L   CO2 28 20 - 32 mmol/L   Calcium  9.8 8.6 - 10.3 mg/dL   Total Protein 7.4 6.1 - 8.1 g/dL   Albumin 4.6 3.6 - 5.1 g/dL   Globulin 2.8 1.9 - 3.7 g/dL (calc)   AG Ratio 1.6 1.0 - 2.5 (calc)   Total Bilirubin 0.5 0.2 - 1.2 mg/dL   Alkaline phosphatase (APISO) 54 35 - 144 U/L   AST 19 10 - 35 U/L   ALT 19 9 - 46 U/L  TSH   Collection Time: 09/10/24  8:20 AM  Result Value Ref Range    TSH 3.42 0.40 - 4.50 mIU/L  Microalbumin / creatinine urine ratio   Collection Time: 09/10/24  8:20 AM  Result Value Ref Range   Creatinine, Urine 71 20 - 320 mg/dL   Microalb, Ur 0.2 mg/dL   Microalb Creat Ratio 3 <30 mg/g creat  PSA   Collection Time: 09/10/24  8:20 AM  Result Value Ref Range   PSA 2.58 < OR = 4.00 ng/mL  CBC with Differential/Platelet   Collection Time: 09/10/24  8:20 AM  Result Value Ref Range   WBC 10.2 3.8 - 10.8 Thousand/uL   RBC 4.92 4.20 - 5.80 Million/uL   Hemoglobin 14.0 13.2 - 17.1 g/dL   HCT 57.0 61.4 - 49.9 %   MCV 87.2 80.0 - 100.0 fL   MCH 28.5 27.0 - 33.0 pg   MCHC 32.6 32.0 - 36.0 g/dL   RDW 86.6 88.9 - 84.9 %   Platelets 251 140 - 400 Thousand/uL   MPV 12.4 7.5 - 12.5 fL   Neutro Abs 4,672 1,500 - 7,800 cells/uL   Absolute Lymphocytes 4,141 (H) 850 - 3,900 cells/uL   Absolute Monocytes 959 (H) 200 - 950 cells/uL   Eosinophils Absolute 398 15 - 500 cells/uL   Basophils Absolute 31 0 - 200 cells/uL   Neutrophils Relative % 45.8 %   Total Lymphocyte 40.6 %   Monocytes Relative 9.4 %   Eosinophils Relative 3.9 %   Basophils Relative 0.3 %  Hemoglobin A1c   Collection Time: 09/10/24  8:20 AM  Result Value Ref Range   Hgb A1c MFr Bld 6.4 (H) <5.7 %   Mean Plasma Glucose 137 mg/dL   eAG (mmol/L) 7.6 mmol/L  Lipid panel   Collection Time: 09/10/24  8:20 AM  Result Value Ref Range   Cholesterol 195 <200 mg/dL   HDL 43 > OR = 40 mg/dL   Triglycerides 889 <849 mg/dL   LDL Cholesterol (Calc) 130 (H) mg/dL (calc)   Total CHOL/HDL Ratio 4.5 <5.0 (calc)   Non-HDL Cholesterol (Calc) 152 (H) <130 mg/dL (calc)      Assessment & Plan:   Problem List Items Addressed This Visit     Aortic atherosclerosis   Relevant Medications   losartan  (COZAAR ) 50 MG tablet   rosuvastatin  (CRESTOR ) 10 MG tablet   BPH with obstruction/lower urinary tract symptoms   Centrilobular emphysema (HCC)   Essential hypertension   Relevant Medications   losartan   (COZAAR ) 50 MG tablet   rosuvastatin  (CRESTOR ) 10 MG tablet   Other Relevant Orders   Ambulatory referral to Cardiology   Obesity (BMI 30.0-34.9)   Pure hypercholesterolemia   Relevant Medications   losartan  (COZAAR ) 50 MG tablet   rosuvastatin  (CRESTOR ) 10 MG  tablet   Other Relevant Orders   Ambulatory referral to Cardiology   Type 2 diabetes mellitus with other specified complication (HCC)   Relevant Medications   losartan  (COZAAR ) 50 MG tablet   rosuvastatin  (CRESTOR ) 10 MG tablet   Other Relevant Orders   Ambulatory referral to Cardiology   Other Visit Diagnoses       Annual physical exam    -  Primary     Flu vaccine need       Relevant Orders   Flu vaccine HIGH DOSE PF(Fluzone Trivalent) (Completed)     Coronary artery calcification       Relevant Medications   losartan  (COZAAR ) 50 MG tablet   rosuvastatin  (CRESTOR ) 10 MG tablet   Other Relevant Orders   Ambulatory referral to Cardiology     Tobacco abuse            Updated Health Maintenance information Reviewed recent lab results with patient Encouraged improvement to lifestyle with diet and exercise Goal of weight loss   Adult Wellness Visit Annual wellness visit conducted. Blood pressure controlled. Stable weight and cholesterol. Improved A1c at 6.4%. Normal kidney, liver, thyroid  function, and urine test. Occasional shortness of breath during exercise. - Continue current lifestyle modifications, including dietary changes and exercise. - Scheduled lung scan. - Referred to cardiologist for further evaluation of cardiovascular risk factors. - Recommended eye doctor appointment.  Immunization management Flu shot administered. Pneumonia and colon cancer screenings up to date. Eligible for shingles vaccine. - Administered flu shot today. - Recommended shingles vaccine.  Type 2 diabetes mellitus A1c improved to 6.4%. Managing diet by reducing intake of potatoes, bread, and rice. Reports drinking less wine. -  Continue dietary modifications to manage blood sugar levels. - Schedule follow-up A1c check in six months.  Essential hypertension Blood pressure controlled with losartan . Normal home readings unless losartan  missed. Occasional irregular heartbeat on home monitor. - Refilled losartan  prescription. - Monitor blood pressure regularly at home.  Pure hypercholesterolemia Cholesterol stable with LDL at 130 mg/dL. Taking rosuvastatin  every other day due to tolerance issues. Reports running out of medication. - Refilled rosuvastatin  prescription. - Continue current dosing schedule of every other day.  Atherosclerotic cardiovascular disease (coronary artery and aorta) Lung scan showed arterial buildup and blockage. High cardiovascular risk due to age, smoking, diabetes, hypertension, and hypercholesterolemia. Occasional chest pain, possibly exercise-related. Irregular heartbeat in history. - Referred to cardiologist for further evaluation and management. - Consider heart scan to assess for arterial blockages.  Centrilobular emphysema Reports shortness of breath during specific gym exercises. - Scheduled lung scan.  Obesity, class 1 Weight stable. Actively managing diet and exercise. - Continue current diet and exercise regimen.  Benign prostatic hyperplasia with lower urinary tract symptoms PSA level stable at 2.58 ng/mL.  Tobacco use Continues to smoke, contributing to cardiovascular risk factors.        Orders Placed This Encounter  Procedures   Flu vaccine HIGH DOSE PF(Fluzone Trivalent)   Ambulatory referral to Cardiology    Referral Priority:   Routine    Referral Type:   Consultation    Referral Reason:   Specialty Services Required    Number of Visits Requested:   1    Meds ordered this encounter  Medications   losartan  (COZAAR ) 50 MG tablet    Sig: Take 1 tablet (50 mg total) by mouth daily.    Dispense:  90 tablet    Refill:  3    Add future refills on file  rosuvastatin  (CRESTOR ) 10 MG tablet    Sig: Take 1 tablet (10 mg total) by mouth every other day.    Dispense:  45 tablet    Refill:  3     Follow up plan: Return in about 6 months (around 03/29/2025) for 6 month DM A1c, HTN, HLD, Cards updates.  Marsa Officer, DO [facility] 09/28/2024, 8:39 AM

## 2024-09-28 NOTE — Patient Instructions (Addendum)
 Thank you for coming to the office today.  You can call to schedule the Low Dose Lung CT Scan  Refilled medications  Recommend Eye Doctor appointment  Flu Shot today  ----------------------  Referral to Cardiologist - they will call you   [facility] at [facility]  [address] [place], [place] [address] Main: [phone]   Please schedule a Follow-up Appointment to: Return in about 6 months (around 03/29/2025) for 6 month DM A1c, HTN, HLD, Cards updates.  If you have any other questions or concerns, please feel free to call the office or send a message through MyChart. You may also schedule an earlier appointment if necessary.  Additionally, you may be receiving a survey about your experience at our office within a few days to 1 week by e-mail or mail. We value your feedback.  Marsa Officer, DO [facility], [place]

## 2024-09-29 ENCOUNTER — Other Ambulatory Visit: Payer: Self-pay

## 2024-09-29 DIAGNOSIS — J3089 Other allergic rhinitis: Secondary | ICD-10-CM

## 2024-09-29 MED ORDER — FLUTICASONE PROPIONATE 50 MCG/ACT NA SUSP: 2.0000 | Freq: Every day | NASAL | 2 refills | Status: AC | PRN

## 2024-09-30 ENCOUNTER — Encounter: Payer: Self-pay | Admitting: Physician Assistant

## 2024-09-30 ENCOUNTER — Ambulatory Visit

## 2024-09-30 ENCOUNTER — Ambulatory Visit: Attending: Physician Assistant | Admitting: Physician Assistant

## 2024-09-30 VITALS — BP 132/78 | HR 133 | Ht 75.5 in | Wt 262.0 lb

## 2024-09-30 DIAGNOSIS — E785 Hyperlipidemia, unspecified: Secondary | ICD-10-CM

## 2024-09-30 DIAGNOSIS — I10 Essential (primary) hypertension: Secondary | ICD-10-CM | POA: Diagnosis not present

## 2024-09-30 DIAGNOSIS — I25118 Atherosclerotic heart disease of native coronary artery with other forms of angina pectoris: Secondary | ICD-10-CM | POA: Diagnosis not present

## 2024-09-30 DIAGNOSIS — R0602 Shortness of breath: Secondary | ICD-10-CM

## 2024-09-30 DIAGNOSIS — J449 Chronic obstructive pulmonary disease, unspecified: Secondary | ICD-10-CM

## 2024-09-30 DIAGNOSIS — I2089 Other forms of angina pectoris: Secondary | ICD-10-CM

## 2024-09-30 DIAGNOSIS — I4719 Other supraventricular tachycardia: Secondary | ICD-10-CM

## 2024-09-30 DIAGNOSIS — Z87891 Personal history of nicotine dependence: Secondary | ICD-10-CM

## 2024-09-30 DIAGNOSIS — I700 Atherosclerosis of aorta: Secondary | ICD-10-CM

## 2024-09-30 DIAGNOSIS — I2510 Atherosclerotic heart disease of native coronary artery without angina pectoris: Secondary | ICD-10-CM

## 2024-09-30 MED ORDER — METOPROLOL TARTRATE 100 MG PO TABS: ORAL_TABLET | ORAL | 0 refills | Status: DC

## 2024-09-30 MED ORDER — METOPROLOL SUCCINATE ER 25 MG PO TB24: 25.0000 mg | ORAL_TABLET | Freq: Every day | ORAL | 3 refills | Status: DC

## 2024-09-30 NOTE — Progress Notes (Signed)
 Cardiology Office Note    Date:  09/30/2024   ID:  Albertus Chiarelli, DOB 17-Aug-1946, MRN [id number]  PCP:  Edman Marsa PARAS, DO  Cardiologist:  New Electrophysiologist:  None   Chief Complaint: Cardiac risk stratification   History of Present Illness:   Jesse Gardner is a 78 y.o. 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   Lung cancer screening CTs dating back to 10/2018 have noted three-vessel coronary calcification with aortic atherosclerosis and emphysema.  During recent CPE with PCP on 09/28/2024 he reported occasional right sided chest discomfort associated with physical strain from gym workouts.  He also reported a history of irregular heartbeats identified during kidney procedure and during flight school from [place] with further details unclear.  He was referred to cardiology for further risk stratification.   He comes in today reporting a longstanding history of exertional shortness of breath that is largely unchanged.  He does try and remain active working out at the gym and does not typically associate shortness of breath with this.  No frank chest pain.  No palpitations, dizziness, near-syncope, or syncope.  He has historically attributed his shortness of breath to weight and deconditioning.  No significant lower extremity swelling or orthopnea.  He was unaware his heart rate was tachycardic upon undergoing EKG in the office today and denies any history of palpitations.  He reports being told he had an irregular heartbeat during flight school physical many years ago with further evaluation on unavailable for review.     ASCVD risk: High; 58.5% 10-year ASCVD risk    Labs independently  reviewed: 08/2024 - TC 195, TG 110, HDL 43, LDL 130, A1c 6.4, Hgb 14.0, PLT 251, TSH normal, BUN 16, serum creatinine 0.84, potassium 4.4, albumin 4.6, AST/ALT normal  Past Medical History:  Diagnosis Date   Aortic atherosclerosis    Arthritis    Asthma    patient reports as a child   CAD (coronary artery disease)    a.  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 Swallow whole.   fluticasone  (FLONASE ) 50 MCG/ACT nasal spray Place 2 sprays into both nostrils daily as needed.   Glucosamine-Chondroitin (OSTEO BI-FLEX REGULAR STRENGTH PO) Take by mouth as needed.   losartan  (COZAAR ) 50 MG tablet Take 1 tablet (50 mg total) by mouth daily.   Melatonin 5 MG  CAPS Take 5 mg by mouth at bedtime as needed (sleep).   metoprolol  succinate (TOPROL  XL) 25 MG 24 hr tablet Take 1 tablet (25 mg total) by mouth daily.   metoprolol  tartrate (LOPRESSOR ) 100 MG tablet TAKE 1 TABLET 2 HR PRIOR TO CARDIAC PROCEDURE   rosuvastatin  (CRESTOR ) 10 MG tablet Take 1 tablet (10 mg total) by mouth every other day.    Allergies:   Patient has no known allergies.   Social History   Socioeconomic  History   Marital status: Widowed    Spouse name: Not on file   Number of children: 2   Years of education: Not on file   Highest education level: Not on file  Occupational History   Occupation: retired  Tobacco Use   Smoking status: Every Day    Current packs/day: 0.50    Average packs/day: 0.5 packs/day for 57.0 years (28.5 ttl pk-yrs)    Types: Cigarettes   Smokeless tobacco: Never   Tobacco comments:    1 cigarette in the last month   Vaping Use   Vaping status: Never Used  Substance and Sexual Activity   Alcohol use: Yes    Alcohol/week: 2.0 standard drinks of alcohol    Types: 2 Glasses of wine per week   Drug use: Never   Sexual activity: Not on file  Other Topics Concern   Not on file  Social History Narrative   Lives alone   Social Drivers of Health   Financial Resource Strain: Low Risk  (07/03/2024)   [facility] from [facility]   Overall Financial Resource Strain (CARDIA)    Difficulty of Paying Living Expenses: Not hard at all  Food Insecurity: No Food Insecurity (07/03/2024)   Received from [facility]   Hunger Vital Sign    Within the past 12 months, you worried that your food would run out before you got the money to buy more.: Never true    Within the past 12 months, the food you bought just didn't last and you didn't have money to get more.: Never true  Transportation Needs: No Transportation Needs (07/03/2024)   Received from [facility] - Transportation    In the past 12 months, has lack of transportation kept you from medical appointments or from getting medications?: No    Lack of Transportation (Non-Medical): No  Physical Activity: Insufficiently Active (11/09/2022)   Exercise Vital Sign    Days of Exercise per Week: 2 days    Minutes of Exercise per Session: 20 min  Stress: No Stress Concern Present (11/09/2022)   [employer] of Occupational Health - Occupational Stress Questionnaire     Feeling of Stress : Not at all  Social Connections: Moderately Isolated (11/09/2022)   Social Connection and Isolation Panel    Frequency of Communication with Friends and Family: Three times a week    Frequency of Social Gatherings with Friends and Family: Twice a week    Attends Religious Services: 1 to 4 times per year    Active Member of [employer] or Organizations: No    Attends Banker Meetings: Never    Marital Status: Widowed     Family History:  The patient's family history includes Alzheimer's disease in his mother; Diabetes in his father; Prostate cancer in his brother.  ROS:   12-point review of systems is negative unless otherwise noted in the HPI.   EKGs/Labs/Other Studies Reviewed:    Studies reviewed  were summarized above. The additional studies were reviewed today: None available for review.    EKG:  EKG is ordered today.  The EKG ordered today demonstrates initial EKG demonstrates narrow complex tachycardia concerning for atrial tachycardia versus atypical atrial flutter with a rate of 133 bpm with a bifascicular block (LAFB and incomplete RBBB), rare fusion complex, and nonspecific ST-T changes. Repeat tracing shows sinus rhythm, 90 bpm, occasional isolated PVC, and incomplete RBBB  Recent Labs: 09/10/2024: ALT 19; BUN 16; Creat 0.84; Hemoglobin 14.0; Platelets 251; Potassium 4.4; Sodium 139; TSH 3.42  Recent Lipid Panel    Component Value Date/Time   CHOL 195 09/10/2024 0820   TRIG 110 09/10/2024 0820   HDL 43 09/10/2024 0820   CHOLHDL 4.5 09/10/2024 0820   LDLCALC 130 (H) 09/10/2024 0820    PHYSICAL EXAM:    VS:  BP 132/78 (BP Location: Right Arm, Patient Position: Sitting, Cuff Size: Normal)   Pulse (!) 133   Ht 6' 3.5 (1.918 m)   Wt 262 lb (118.8 kg)   SpO2 97%   BMI 32.32 kg/m   BMI: Body mass index is 32.32 kg/m.  Physical Exam Vitals reviewed.  Constitutional:      Appearance: He is well-developed.  HENT:     Head:  Normocephalic and atraumatic.  Eyes:     General:        Right eye: No discharge.        Left eye: No discharge.  Cardiovascular:     Rate and Rhythm: Normal rate and regular rhythm.     Pulses:          Posterior tibial pulses are 2+ on the right side and 2+ on the left side.     Heart sounds: Normal heart sounds, S1 normal and S2 normal. Heart sounds not distant. No midsystolic click and no opening snap. No murmur heard.    No friction rub.  Pulmonary:     Effort: Pulmonary effort is normal. No respiratory distress.     Breath sounds: Normal breath sounds. No decreased breath sounds, wheezing, rhonchi or rales.  Musculoskeletal:     Cervical back: Normal range of motion.     Right lower leg: No edema.     Left lower leg: No edema.  Skin:    General: Skin is warm and dry.     Nails: There is no clubbing.  Neurological:     Mental Status: He is alert and oriented to person, place, and time.  Psychiatric:        Speech: Speech normal.        Behavior: Behavior normal.        Thought Content: Thought content normal.        Judgment: Judgment normal.     Wt Readings from Last 3 Encounters:  09/30/24 262 lb (118.8 kg)  09/28/24 264 lb 8 oz (120 kg)  06/23/24 257 lb 6 oz (116.7 kg)     ASSESSMENT & PLAN:   Multivessel coronary artery calcification: Noted on prior lung cancer screening CT imaging dating back to 10/2018.  Multiple risk factors including male with age 73 years, DM2, ongoing tobacco use, aortic atherosclerosis, HTN, and HLD.  10-year ASCVD risk 58.5%.  He reports a longstanding history of exertional shortness of breath without symptoms of chest pain.  Concern that shortness of breath is his anginal equivalent.  Pursue coronary CTA and echo.  Continue aspirin  81 mg and rosuvastatin  10 mg every other day for now with  plans to likely escalate lipid-lowering therapy with addition of PCSK9 inhibitor at follow-up visit.  Narrow complex tachycardia: He reports a longstanding  history of an irregular heartbeat.  Initial EKG in the office today notable for narrow complex tachycardic rate concerning for atrial tachycardia versus atypical atrial flutter with 2-1 AV block.  Upon entering the room I placed a pulse ox on the patient's finger with a heart rate around 102 to 103 bpm.  Repeat tracing shows a sinus rhythm in the 90s bpm.  We did discuss anticoagulation given concern for atrial flutter and have agreed to defer this for now pending further evaluation.  Place ZIO AT.  Start Toprol -XL 25 mg daily.  Recent labs including thyroid  function normal.  Obtain echo.  Aortic atherosclerosis/HLD: LDL 130 in 08/2024 with target LDL at least less than 70 with known multivessel CAD and underlying diabetes.  Reports intolerance with daily rosuvastatin  with diarrhea.  Was not regularly taking rosuvastatin  when labs were obtained last month.  We did briefly discuss initiation of PCSK9 inhibitor at today's visit with plans to revisit follow-up labs at next visit and escalate lipid-lowering therapy as indicated.    HTN: Blood pressure is mildly elevated in the office today.  Add Toprol -XL 25 mg daily with continuation of losartan  50 mg daily.  COPD with prior tobacco use: No active exacerbation.    DM2 with peripheral neuropathy: A1c 6.4.  Management per PCP.      Disposition: F/u with me in 2 months.   Medication Adjustments/Labs and Tests Ordered: Current medicines are reviewed at length with the patient today.  Concerns regarding medicines are outlined above. Medication changes, Labs and Tests ordered today are summarized above and listed in the Patient Instructions accessible in Encounters.   Signed, Bernardino Bring, PA-C 09/30/2024 4:23 PM     [facility] [address] [place], [place] [address] [phone]

## 2024-09-30 NOTE — Patient Instructions (Signed)
 Medication Instructions:  Your physician recommends the following medication changes.  START TAKING: Toprol  - XL 25 mg daily   *If you need a refill on your cardiac medications before your next appointment, please call your pharmacy*  Lab Work: None ordered at this time   Testing/Procedures: Your physician has requested that you have an echocardiogram. Echocardiography is a painless test that uses sound waves to create images of your heart. It provides your doctor with information about the size and shape of your heart and how well your heart's chambers and valves are working.   You may receive an ultrasound enhancing agent through an IV if needed to better visualize your heart during the echo. This procedure takes approximately one hour.  There are no restrictions for this procedure.  This will take place at [address] [facility]) [address], [place] [address]  Please note: We ask at that you not bring children with you during ultrasound (echo/ vascular) testing. Due to room size and safety concerns, children are not allowed in the ultrasound rooms during exams. Our front office staff cannot provide observation of children in our lobby area while testing is being conducted. An adult accompanying a patient to their appointment will only be allowed in the ultrasound room at the discretion of the ultrasound technician under special circumstances. We apologize for any inconvenience.    Your cardiac CT will be scheduled at one of the below locations:   [facility] [address] [place], [place] [address] [phone] (Severe contrast allergies only)  OR   [facility] [address] [place], [place] [address] [phone]  OR   [facility] [address] [place], [place] [address] [phone]  OR   [facility] and Vascular Tower [address]  [place], [place] [address]  OR   [facility] [address] [place], [place] [phone]  If scheduled at [facility], please arrive at the [facility] and Children's Entrance (Entrance C2) of [facility] 30 minutes prior to test start time. You can use the FREE valet parking offered at entrance C (encouraged to control the heart rate for the test)  Proceed to the [facility] Radiology Department (first floor) to check-in and test prep.  All radiology patients and guests should use entrance C2 at [facility], accessed from [facility], even though the hospital's physical address listed is [address]  If scheduled at the Heart and Vascular Tower at [address], please enter the parking lot using the [address] entrance and use the FREE valet service at the patient drop-off area. Enter the building and check-in with registration on the main floor.  If scheduled at [facility], please arrive to the Heart and Vascular Center 15 mins early for check-in and test prep.  There is spacious parking and easy access to the radiology department from the [facility] Heart and Vascular entrance. Please enter here and check-in with the desk attendant.   If scheduled at [facility], please arrive 30 minutes early for check-in and test prep.  Please follow these instructions carefully (unless otherwise directed):  An IV will be required for this test and Nitroglycerin will be given.  Hold all erectile dysfunction medications at least 3 days (72 hrs) prior to test. (Ie viagra, cialis, sildenafil, tadalafil, etc)   On the Night Before the Test: Be sure to Drink plenty of water. Do  not consume any caffeinated/decaffeinated beverages or chocolate 12 hours prior to your test. Do not take any antihistamines 12 hours prior to your test.  On the Day of the Test: Drink plenty of water until 1 hour prior to the test. Do not eat any food 1 hour prior to test. You may take  your regular medications prior to the test.  Take metoprolol  (Lopressor ) two hours prior to test. If you take Furosemide/Hydrochlorothiazide/Spironolactone/Chlorthalidone, please HOLD on the morning of the test. Patients who wear a continuous glucose monitor MUST remove the device prior to scanning.      After the Test: Drink plenty of water. After receiving IV contrast, you may experience a mild flushed feeling. This is normal. On occasion, you may experience a mild rash up to 24 hours after the test. This is not dangerous. If this occurs, you can take Benadryl 25 mg, Zyrtec, Claritin, or Allegra and increase your fluid intake. (Patients taking Tikosyn should avoid Benadryl, and may take Zyrtec, Claritin, or Allegra) If you experience trouble breathing, this can be serious. If it is severe call 911 IMMEDIATELY. If it is mild, please call our office.  We will call to schedule your test 2-4 weeks out understanding that some insurance companies will need an authorization prior to the service being performed.   For more information and frequently asked questions, please visit our website : [email or web address]  For non-scheduling related questions, please contact the cardiac imaging nurse navigator should you have any questions/concerns: Cardiac Imaging Nurse Navigators Direct Office Dial: [phone]   For scheduling needs, including cancellations and rescheduling, please call Brittany, [phone].  ZIO AT Long term monitor-Live Telemetry  Your physician has requested you wear a ZIO patch monitor for 14 days.  This is a single patch monitor. [employer] supplies one patch monitor per enrollment.  Additional stickers are not available.  Please do not apply patch if you will be having a Nuclear Stress Test, Echocardiogram, Cardiac CT, MRI, or Chest Xray during the period you would be wearing the monitor.  The patch cannot be worn during these tests.  You cannot remove and re-apply the  ZIO AT patch monitor.  Your ZIO patch monitor will be mailed 3 day USPS to your address on file. It may take 3-5 days to receive your monitor after you have been enrolled.  Once you have received your monitor, please review the enclosed instructions. Your monitor has already been registered assigning a specific monitor serial number to you.   Billing and Patient Assistance Program information  Meredeth has been supplied with any insurance information on record for billing.  [employer] offers a sliding scale Patient Assistance Program for patients without insurance, or whose insurance does not completely cover the cost of the ZIO patch monitor.  You must apply for the Patient Assistance Program to qualify for the discounted rate.  To apply, call [employer] at [phone], select option 4, select option 2 , ask to apply for the Patient Assistance Program, (you can request an interpreter if needed).  [employer] will ask your household income and how many people are in your household.  [employer] will quote your out-of-pocket cost based on this information. They will also be able to set up a 12 month interest free payment plan if needed.  Applying the monitor   Shave hair from upper left chest.  Hold the abrader disc by orange tab. Rub the abrader in 40 strokes over left upper chest as indicated in your monitor instructions.  Clean area  with 4 enclosed alcohol pads. Use all pads to ensure the area is cleaned thoroughly. Let dry.  Apply patch as indicated in monitor instructions. Patch will be placed under collarbone on left side of chest with arrow pointing upward.  Rub patch adhesive wings for 2 minutes. Remove the white label marked 1. Remove the white label marked 2. Rub patch adhesive wings for 2 additional minutes.  While looking in a mirror, press and release button in center of patch. A small green light will flash 3-4 times.  This will be your only indicator that the monitor has been turned on.   Do not  shower for the first 24 hours.  You may shower after the first 24 hours.  Press the button if you feel a symptom.  You will hear a small click.  Record Date, Time and Symptom in the Patient Log.   Starting the Gateway  In your kit there is a audiological scientist box the size of a cellphone.  This is [employer]. It transmits all your recorded data to [facility].  This box must always stay within 10 feet of you.  Open the box and push the * button.  There will be a light that blinks orange and then green a few times.  When the light stops blinking, the Gateway is connected to the ZIO patch. Call [employer] at [phone] to confirm your monitor is transmitting.  Returning your monitor  Remove your patch and place it inside the Gateway.  In the lower half of the Gateway box there is a white bag with prepaid postage on it.  Place Gateway in bag and seal.  Mail package back to [employer] as soon as possible.  Your physician should have your final report approximately 7-14 days after you have mailed back your monitor. Call [facility] Customer Care at [phone] if you have questions regarding your ZIO AT patch monitor.   Call them immediately if you see an orange light blinking on your monitor.  If your monitor falls off in less than 4 days, contact our Monitor department at [phone]. If your monitor becomes loose or falls off after 4 days call [employer] at [phone] for suggestions on securing your monitor.    Follow-Up: At [facility], you and your health needs are our priority.  As part of our continuing mission to provide you with exceptional heart care, our providers are all part of one team.  This team includes your primary Cardiologist (physician) and Advanced Practice Providers or APPs (Physician Assistants and Nurse Practitioners) who all work together to provide you with the care you need, when you need it.  Your next appointment:   2 month(s)  Provider:   You may see  Bernardino Bring, PA-C  We recommend signing up for the patient portal called MyChart.  Sign up information is provided on this After Visit Summary.  MyChart is used to connect with patients for Virtual Visits (Telemedicine).  Patients are able to view lab/test results, encounter notes, upcoming appointments, etc.  Non-urgent messages can be sent to your provider as well.   To learn more about what you can do with MyChart, go to [email or web address].

## 2024-10-01 ENCOUNTER — Other Ambulatory Visit: Payer: Self-pay | Admitting: Physician Assistant

## 2024-10-05 ENCOUNTER — Ambulatory Visit: Admission: RE | Admit: 2024-10-05 | Discharge: 2024-10-05 | Attending: Acute Care | Admitting: Acute Care

## 2024-10-05 DIAGNOSIS — F17210 Nicotine dependence, cigarettes, uncomplicated: Secondary | ICD-10-CM

## 2024-10-05 DIAGNOSIS — Z122 Encounter for screening for malignant neoplasm of respiratory organs: Secondary | ICD-10-CM

## 2024-10-05 DIAGNOSIS — Z87891 Personal history of nicotine dependence: Secondary | ICD-10-CM

## 2024-10-21 ENCOUNTER — Other Ambulatory Visit: Payer: Self-pay | Admitting: Family Medicine

## 2024-10-21 DIAGNOSIS — I10 Essential (primary) hypertension: Secondary | ICD-10-CM

## 2024-10-23 NOTE — Telephone Encounter (Signed)
 Duplicate request, too soon for refill.  Requested Prescriptions  Pending Prescriptions Disp Refills   losartan  (COZAAR ) 50 MG tablet [Pharmacy Med Name: LOSARTAN  50MG  TABLETS] 90 tablet 3    Sig: TAKE 1 TABLET(50 MG) BY MOUTH DAILY     Cardiovascular:  Angiotensin Receptor Blockers Passed - 10/23/2024 10:31 AM      Passed - Cr in normal range and within 180 days    Creat  Date Value Ref Range Status  09/10/2024 0.84 0.70 - 1.28 mg/dL Final   Creatinine, Urine  Date Value Ref Range Status  09/10/2024 71 20 - 320 mg/dL Final         Passed - K in normal range and within 180 days    Potassium  Date Value Ref Range Status  09/10/2024 4.4 3.5 - 5.3 mmol/L Final         Passed - Patient is not pregnant      Passed - Last BP in normal range    BP Readings from Last 1 Encounters:  09/30/24 132/78         Passed - Valid encounter within last 6 months    Recent Outpatient Visits           3 weeks ago Annual physical exam   [place] [facility], Marsa PARAS, DO   4 months ago Seasonal allergic rhinitis due to other allergic trigger   [place] [facility] Everlene Parris LABOR, MD   7 months ago Pre-diabetes   [facility] Edman Marsa PARAS, DO       Future Appointments             In 1 month Dunn, Bernardino HERO, PA-C [facility]

## 2024-11-03 ENCOUNTER — Ambulatory Visit: Attending: Physician Assistant

## 2024-11-03 ENCOUNTER — Ambulatory Visit: Payer: Self-pay | Admitting: Physician Assistant

## 2024-11-03 DIAGNOSIS — R0602 Shortness of breath: Secondary | ICD-10-CM | POA: Diagnosis not present

## 2024-11-03 LAB — ECHOCARDIOGRAM COMPLETE
AR max vel: 3.37 cm2
AV Area VTI: 3.35 cm2
AV Area mean vel: 3.36 cm2
AV Mean grad: 3 mmHg
AV Peak grad: 5.9 mmHg
Ao pk vel: 1.21 m/s
Area-P 1/2: 3.85 cm2
Calc EF: 38.5 %
S' Lateral: 4.8 cm
Single Plane A2C EF: 39.9 %
Single Plane A4C EF: 36.6 %

## 2024-11-04 ENCOUNTER — Encounter: Payer: Self-pay | Admitting: Physician Assistant

## 2024-11-04 ENCOUNTER — Ambulatory Visit: Admitting: Physician Assistant

## 2024-11-04 ENCOUNTER — Other Ambulatory Visit
Admission: RE | Admit: 2024-11-04 | Discharge: 2024-11-04 | Disposition: A | Discharge status: Home / Self Care | Source: Ambulatory Visit | Attending: Physician Assistant | Admitting: Physician Assistant

## 2024-11-04 VITALS — BP 121/72 | HR 90 | Ht 76.0 in | Wt 258.0 lb

## 2024-11-04 DIAGNOSIS — J449 Chronic obstructive pulmonary disease, unspecified: Secondary | ICD-10-CM

## 2024-11-04 DIAGNOSIS — E785 Hyperlipidemia, unspecified: Secondary | ICD-10-CM | POA: Insufficient documentation

## 2024-11-04 DIAGNOSIS — Z79899 Other long term (current) drug therapy: Secondary | ICD-10-CM | POA: Diagnosis present

## 2024-11-04 DIAGNOSIS — I2510 Atherosclerotic heart disease of native coronary artery without angina pectoris: Secondary | ICD-10-CM | POA: Diagnosis present

## 2024-11-04 DIAGNOSIS — Z87891 Personal history of nicotine dependence: Secondary | ICD-10-CM | POA: Diagnosis present

## 2024-11-04 DIAGNOSIS — I700 Atherosclerosis of aorta: Secondary | ICD-10-CM

## 2024-11-04 DIAGNOSIS — I5020 Unspecified systolic (congestive) heart failure: Secondary | ICD-10-CM | POA: Diagnosis present

## 2024-11-04 DIAGNOSIS — I10 Essential (primary) hypertension: Secondary | ICD-10-CM

## 2024-11-04 DIAGNOSIS — I4719 Other supraventricular tachycardia: Secondary | ICD-10-CM | POA: Diagnosis present

## 2024-11-04 LAB — CBC
HCT: 41 % (ref 39.0–52.0)
Hemoglobin: 13.8 g/dL (ref 13.0–17.0)
MCH: 29.4 pg (ref 26.0–34.0)
MCHC: 33.7 g/dL (ref 30.0–36.0)
MCV: 87.4 fL (ref 80.0–100.0)
Platelets: 244 K/uL (ref 150–400)
RBC: 4.69 MIL/uL (ref 4.22–5.81)
RDW: 13.4 % (ref 11.5–15.5)
WBC: 13.3 K/uL — ABNORMAL HIGH (ref 4.0–10.5)
nRBC: 0 % (ref 0.0–0.2)

## 2024-11-04 LAB — LIPID PANEL
Cholesterol: 172 mg/dL (ref 0–200)
HDL: 39 mg/dL — ABNORMAL LOW
LDL Cholesterol: 91 mg/dL (ref 0–99)
Total CHOL/HDL Ratio: 4.4 ratio
Triglycerides: 212 mg/dL — ABNORMAL HIGH
VLDL: 42 mg/dL — ABNORMAL HIGH (ref 0–40)

## 2024-11-04 LAB — COMPREHENSIVE METABOLIC PANEL WITH GFR
ALT: 32 U/L (ref 0–44)
AST: 31 U/L (ref 15–41)
Albumin: 4.7 g/dL (ref 3.5–5.0)
Alkaline Phosphatase: 73 U/L (ref 38–126)
Anion gap: 13 (ref 5–15)
BUN: 18 mg/dL (ref 8–23)
CO2: 25 mmol/L (ref 22–32)
Calcium: 9.6 mg/dL (ref 8.9–10.3)
Chloride: 103 mmol/L (ref 98–111)
Creatinine, Ser: 0.99 mg/dL (ref 0.61–1.24)
GFR, Estimated: >60 mL/min
Glucose, Bld: 104 mg/dL — ABNORMAL HIGH (ref 70–99)
Potassium: 4.1 mmol/L (ref 3.5–5.1)
Sodium: 140 mmol/L (ref 135–145)
Total Bilirubin: 0.3 mg/dL (ref 0.0–1.2)
Total Protein: 7.9 g/dL (ref 6.5–8.1)

## 2024-11-04 MED ORDER — ASPIRIN 81 MG PO TBEC: 81.0000 mg | DELAYED_RELEASE_TABLET | Freq: Every day | ORAL | Status: AC

## 2024-11-04 NOTE — Patient Instructions (Addendum)
 Medication Instructions:  Your physician recommends the following medication changes.  STOP TAKING: Lopressor    START TAKING: Aspirin  81 mg daily  *If you need a refill on your cardiac medications before your next appointment, please call your pharmacy*  Lab Work: Your provider would like for you to have following labs drawn today CBC, CMeT, Direct LDL, and Lipid panel.   If you have labs (blood work) drawn today and your tests are completely normal, you will receive your results only by: MyChart Message (if you have MyChart) OR A paper copy in the mail If you have any lab test that is abnormal or we need to change your treatment, we will call you to review the results.  Testing/Procedures:  [place] [facility] [place] [address] [address] Dept: [phone] Loc: [phone]  Sheri Prows  11/04/2024  You are scheduled for a Cardiac Catheterization on Monday, January 19 with Dr. Deatrice Cage.  1. Please arrive at the [facility] Entrance of [facility], [address], [place] [address] at 8:30 AM (This is 1 hour(s) prior to your procedure time).  Proceed to the Check-In Desk directly inside the entrance.  Procedure Parking: Use the entrance off of the [facility] [address] side of the hospital. Turn right upon entering and follow the driveway to parking that is directly in front of the Heart [facility] Vascular Center. There is no valet parking available at this entrance, however there is an awning directly in front of the Heart & Vascular Center for drop off/ pick up for patients.  Special note: Every effort is made to have your procedure done on time. Please understand that emergencies sometimes delay scheduled procedures.  2. Diet: Nothing to eat after midnight.   3. Hydration: You need to be well hydrated before your procedure. On January 19, you may drink approved  liquids (see below) until 2 hours before the procedure, with 16 oz of water as your last intake.   List of approved liquids water, clear juice, clear tea, black coffee, fruit juices, non-citric and without pulp, carbonated beverages, Gatorade, Kool -Aid, plain Jello-O and plain ice popsicles.  4. Labs: You will need to have blood drawn today.  5. Medication instructions in preparation for your procedure:   Contrast Allergy: No  On the morning of your procedure, take your Aspirin  81 mg and any morning medicines.  You may use sips of water.  6. Plan to go home the same day, you will only stay overnight if medically necessary. 7. Bring a current list of your medications and current insurance cards. 8. You MUST have a responsible person to drive you home. 9. Someone MUST be with you the first 24 hours after you arrive home or your discharge will be delayed. 10. Please wear clothes that are easy to get on and off and wear slip-on shoes.  Thank you for allowing us  to care for you!   -- [facility]   Follow-Up: At [facility], you and your health needs are our priority.  As part of our continuing mission to provide you with exceptional heart care, our providers are all part of one team.  This team includes your primary Cardiologist (physician) and Advanced Practice Providers or APPs (Physician Assistants and Nurse Practitioners) who all work together to provide you with the care you need, when you need it.  Your next appointment:  1-2 weeks after cath on 11/16/2024  Provider:   You may see Dr Deatrice Cage or Bernardino Bring, PA-C  We recommend signing up for the patient portal called MyChart.  Sign up information is provided on this After Visit Summary.  MyChart is used to connect with patients for Virtual Visits (Telemedicine).  Patients are able to view lab/test results, encounter notes, upcoming appointments, etc.  Non-urgent messages can be sent to  your provider as well.   To learn more about what you can do with MyChart, go to [email or web address].

## 2024-11-04 NOTE — H&P (View-Only) (Signed)
 "  Cardiology Office Note    Date:  11/04/2024   ID:  Eston Heslin, DOB April 20, 1946, MRN [id number]  PCP:  Edman Marsa PARAS, DO  Cardiologist:  None  Electrophysiologist:  None   Chief Complaint: Follow-up echo  History of Present Illness:   Kionte Baumgardner is a 79 y.o. 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  Lung cancer screening CTs dating back to 10/2018 have noted three-vessel coronary calcification with aortic atherosclerosis and emphysema.  During recent CPE with PCP on 09/28/2024 he reported occasional right sided chest discomfort associated with physical strain from gym workouts.  He also reported a history of irregular heartbeats identified during kidney procedure and during flight school from [place] with further details unclear.  He was referred to cardiology for further risk stratification.  Upon establishing with our office on 09/30/2024 he reported a longstanding history of exertional shortness of breath that was unchanged.  He tried to remain active working out at [employer] and typically did not notice dyspnea with this.  No frank chest pain.  During office visit, while undergoing rooming, he was noted to be tachycardic with EKG demonstrating narrow complex tachycardia concerning for atrial tachycardia versus atypical atrial flutter with a rate of 133 bpm.  Repeat tracing showed sinus rhythm with occasional isolated PVCs.  After discussion with the patient, anticoagulation was deferred with recommendation to pursue outpatient cardiac monitoring and started Toprol -XL.  He was also scheduled for echo and coronary CTA for further risk stratification.  Echo on 11/03/2024 demonstrated an EF of 35 to 40%, mildly  dilated LV cavity size, normal RV systolic function with moderately enlarged RV cavity, moderately dilated right atrium, mild mitral regurgitation, mild calcification and aortic sclerosis with mild aortic insufficiency.  He comes in today continuing to note longstanding exertional shortness of breath that is unchanged.  No frank chest pain.  Continues to workout at the gym regularly without associated shortness of breath or chest pain.  He does notice some positional dizziness if he stands up quickly.  No near-syncope or syncope.  No falls or symptoms concerning for bleeding.  No significant lower extremity swelling or orthopnea.  No palpitations.  Not currently taking baby aspirin .  Tolerating rosuvastatin  10 mg every other day, notes diarrhea with daily dosing.  Weight down 4 pounds today when compared to visit in 09/2024.  He does not have any acute cardiac concerns at this time.   Labs independently reviewed: 08/2024 - TC 195, TG 110, HDL 43, LDL 130, A1c 6.4, Hgb 14.0, PLT 251, TSH normal, BUN 16, serum creatinine 0.84, potassium 4.4, albumin 4.6, AST/ALT normal   Past Medical History:  Diagnosis Date   Aortic atherosclerosis    Arthritis    Asthma    patient reports as a child   CAD (coronary artery disease)    a.  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   Social History   Socioeconomic History   Marital status: Widowed    Spouse name: Not on file   Number of children: 2   Years of education: Not on file   Highest education level: Not on file  Occupational History   Occupation: retired  Tobacco Use   Smoking status: Every Day    Current packs/day: 0.50    Average packs/day: 0.5 packs/day for 57.0 years (28.5 ttl pk-yrs)    Types: Cigarettes   Smokeless tobacco: Never   Tobacco comments:    1 cigarette in the last month   Vaping Use   Vaping status: Never Used  Substance and Sexual Activity   Alcohol use: Yes    Alcohol/week: 2.0 standard drinks of alcohol    Types: 2 Glasses of wine per week   Drug use: Never   Sexual activity: Not on file  Other Topics Concern   Not on file  Social History Narrative   Lives alone   Social Drivers of Health   Tobacco Use: High Risk (09/30/2024)   Patient History    Smoking Tobacco Use: Every Day    Smokeless Tobacco Use: Never    Passive Exposure: Not on file  Financial Resource Strain: Low Risk  (07/03/2024)   [facility]   Overall Financial Resource Strain (CARDIA)    Difficulty of Paying Living Expenses: Not hard at all  Food Insecurity: No Food Insecurity (07/03/2024)   Received from [facility]   Epic    Within the past 12 months, you worried that your food would run out before you got the money to buy more.: Never true    Within the past 12 months, the food you bought just didn't last and you didn't have money to get more.: Never true  Transportation Needs: No Transportation Needs (07/03/2024)   Received from [facility]    In the past 12 months, has lack of transportation kept you from medical appointments or from getting  medications?: No    Lack of Transportation (Non-Medical): No  Physical Activity: Insufficiently Active (11/09/2022)   Exercise Vital Sign    Days of Exercise per Week: 2 days    Minutes of Exercise per Session: 20 min  Stress: No Stress Concern Present (11/09/2022)   [employer] of Occupational Health - Occupational Stress Questionnaire    Feeling of Stress : Not at all  Social Connections: Moderately Isolated (11/09/2022)   Social Connection and Isolation Panel    Frequency of Communication with Friends and Family: Three times a week    Frequency of Social Gatherings with Friends and Family: Twice a week    Attends Religious Services: 1 to 4 times per year    Active Member of [employer] or Organizations: No    Attends Banker Meetings: Never    Marital Status: Widowed  Depression (PHQ2-9): Low Risk (09/28/2024)   Depression (PHQ2-9)    PHQ-2 Score: 0  Alcohol Screen: Low Risk (09/03/2023)   Alcohol Screen    Last Alcohol Screening Score (AUDIT): 3  Housing: Low Risk  (07/03/2024)   Received from [facility]   Epic    In the last 12 months, was there a time when you were not able to pay the mortgage or rent on time?: No    In the past 12 months, how many times have you moved where you were living?: 0    At any time in the past 12 months, were you homeless or living in a shelter (including now)?: No  Utilities: Not At Risk (07/03/2024)   Received from [facility]   Epic    In the past 12 months has the electric, gas, oil, or water company threatened to shut off services in your home?: No  Health Literacy: Not on file     Family History:  The patient's family history includes Alzheimer's disease in his mother; Diabetes in his father; Prostate cancer in his brother.  ROS:   12-point review of systems is negative unless otherwise noted in the HPI.   EKGs/Labs/Other Studies Reviewed:    Studies reviewed were summarized above. The  additional studies were reviewed today:  2D echo 11/03/2024: 1. Left ventricular ejection fraction, by estimation, is 35 to 40%. Left  ventricular ejection fraction by 3D volume is 40 %. Left ventricular  ejection fraction by 2D MOD biplane is 38.5 %. The left ventricle has  moderately decreased function. Left  ventricular endocardial border not optimally defined to evaluate regional  wall motion. The left ventricular internal cavity size was mildly dilated.  Indeterminate diastolic filling due to E-A fusion.   2. Right ventricular systolic function is normal. The right ventricular  size is moderately enlarged.   3. Right atrial size was moderately dilated.   4. The mitral valve is degenerative. Mild mitral valve regurgitation. No  evidence of mitral stenosis.   5. The aortic valve was not well visualized. There is mild calcification  of the aortic valve. Aortic valve regurgitation is mild. Aortic valve  sclerosis/calcification is present, without any evidence of aortic  stenosis.    EKG:  EKG is ordered today.  The EKG ordered today demonstrates NSR, 90 bpm, left axis deviation, rare PVC, poor R wave progression along the precordial leads, no acute ST-T changes  Recent Labs: 09/10/2024: ALT 19; BUN 16; Creat 0.84; Hemoglobin 14.0; Platelets 251; Potassium 4.4; Sodium 139; TSH 3.42  Recent Lipid Panel    Component Value Date/Time   CHOL 195 09/10/2024 0820   TRIG 110 09/10/2024 0820   HDL 43 09/10/2024 0820   CHOLHDL 4.5 09/10/2024 0820   LDLCALC 130 (H) 09/10/2024 0820    PHYSICAL EXAM:    VS:  BP 121/72 (BP Location: Left Arm, Patient Position: Sitting, Cuff Size: Normal)   Pulse 90   Ht 6' 4 (1.93 m)   Wt 258 lb (117 kg)   SpO2 93%   BMI 31.40 kg/m   BMI: Body mass index is 31.4 kg/m.  Physical Exam Vitals reviewed.  Constitutional:      Appearance: He is well-developed.  HENT:     Head: Normocephalic and atraumatic.  Eyes:     General:        Right eye: No  discharge.        Left eye: No discharge.  Cardiovascular:     Rate and Rhythm: Normal rate and regular rhythm.     Heart sounds:  Normal heart sounds, S1 normal and S2 normal. Heart sounds not distant. No midsystolic click and no opening snap. No murmur heard.    No friction rub.  Pulmonary:     Effort: Pulmonary effort is normal. No respiratory distress.     Breath sounds: Normal breath sounds. No decreased breath sounds, wheezing, rhonchi or rales.  Musculoskeletal:     Cervical back: Normal range of motion.  Skin:    General: Skin is warm and dry.     Nails: There is no clubbing.  Neurological:     Mental Status: He is alert and oriented to person, place, and time.  Psychiatric:        Speech: Speech normal.        Behavior: Behavior normal.        Thought Content: Thought content normal.        Judgment: Judgment normal.     Wt Readings from Last 3 Encounters:  11/04/24 258 lb (117 kg)  09/30/24 262 lb (118.8 kg)  09/28/24 264 lb 8 oz (120 kg)     ASSESSMENT & PLAN:   HFrEF: Euvolemic and well compensated.  Pursue R/LHC for further evaluation of cardiomyopathy and to better understand hemodynamics.  Cancel coronary CTA.  We did discuss transitioning losartan  to Entresto, however he just recently received a 90-day supply of losartan  and prefers to continue this at this time.  Therefore, he will be continued on losartan  50 mg and Toprol -XL 25 mg daily.  Following cardiac cath, recommend further escalation of GDMT with transition from ARB to ARNI, along with addition of SGLT2 inhibitor and MRA to further optimize pharmacotherapy.  Patient will need follow-up echo in approximately 3 months pending cardiac catheter results and on optimal evidence-based medical therapy to reevaluate LV systolic function.  CHF education.  Not currently requiring standing loop diuretic.  Multivessel coronary artery calcification: Noted on prior lung cancer screening CTs dating back to 10/2018.  He has  multiple risk factors including male status with age 62 years, DM2, tobacco use, aortic atherosclerosis, HTN, HLD, and obesity.  Recent echo demonstrated cardiomyopathy of uncertain chronicity as outlined above with recommendation to pursue diagnostic R/LHC.  Start aspirin  81 mg daily (was previously on his list though was not taking), and rosuvastatin  as outlined below.    Narrow complex tachycardia: No symptoms of palpitations.  Preliminary review of Zio patch shows a sinus rhythm with rates ranging from 60 to 116 bpm in sinus, 3 episodes of nonsustained SVT and 1 episode of NSVT.  Await finalized review.  Remains on Toprol -XL 25 mg as outlined above.  Aortic atherosclerosis/HLD: LDL 130 in 08/2024 with target LDL at least less than 70.  Check LFT, lipid panel, and direct LDL.  For now, he will remain on rosuvastatin  10 mg every other day as he reports diarrhea on higher doses and with more frequent dosing.  We will trend lipid panel with recommendation to undergo rechallenge of rosuvastatin  titration and if diarrhea returns pursue PCSK9 inhibitor versus bempedoic acid.  HTN: Blood pressure is well-controlled in the office today.  Pharmacotherapy as outlined above.  COPD with prior tobacco use: No acute exacerbation.    DM2 with peripheral neuropathy: A1c 6.4.  Management per PCP.   Informed Consent   Shared Decision Making/Informed Consent{  The risks [stroke (1 in 1000), death (1 in 1000), kidney failure [usually temporary] (1 in 500), bleeding (1 in 200), allergic reaction [possibly serious] (1 in 200)], benefits (diagnostic support and management of  coronary artery disease) and alternatives of a cardiac catheterization were discussed in detail with Mr. Dalzell and he is willing to proceed.        Disposition: F/u with me 1-2 weeks after [facility] with plans to establish with MD thereafter.   Medication Adjustments/Labs and Tests Ordered: Current medicines are reviewed at length with the  patient today.  Concerns regarding medicines are outlined above. Medication changes, Labs and Tests ordered today are summarized above and listed in the Patient Instructions accessible in Encounters.   SignedBernardino Bring, PA-C 11/04/2024 4:45 PM     [facility] [address] [place], [place] [address] [phone]     [1]  Current Meds  Medication Sig   fluticasone  (FLONASE ) 50 MCG/ACT nasal spray Place 2 sprays into both nostrils daily as needed.   Glucosamine-Chondroitin (OSTEO BI-FLEX REGULAR STRENGTH PO) Take by mouth as needed.   losartan  (COZAAR ) 50 MG tablet Take 1 tablet (50 mg total) by mouth daily.   Melatonin 5 MG CAPS Take 5 mg by mouth at bedtime as needed (sleep).   metoprolol  succinate (TOPROL  XL) 25 MG 24 hr tablet Take 1 tablet (25 mg total) by mouth daily.   rosuvastatin  (CRESTOR ) 10 MG tablet Take 1 tablet (10 mg total) by mouth every other day.   "

## 2024-11-04 NOTE — Progress Notes (Signed)
 "  Cardiology Office Note    Date:  11/04/2024   ID:  Jesse Gardner, DOB April 20, 1946, MRN [id number]  PCP:  Edman Marsa PARAS, DO  Cardiologist:  None  Electrophysiologist:  None   Chief Complaint: Follow-up echo  History of Present Illness:   Jesse Gardner is a 79 y.o. 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  Lung cancer screening CTs dating back to 10/2018 have noted three-vessel coronary calcification with aortic atherosclerosis and emphysema.  During recent CPE with PCP on 09/28/2024 he reported occasional right sided chest discomfort associated with physical strain from gym workouts.  He also reported a history of irregular heartbeats identified during kidney procedure and during flight school from [place] with further details unclear.  He was referred to cardiology for further risk stratification.  Upon establishing with our office on 09/30/2024 he reported a longstanding history of exertional shortness of breath that was unchanged.  He tried to remain active working out at [employer] and typically did not notice dyspnea with this.  No frank chest pain.  During office visit, while undergoing rooming, he was noted to be tachycardic with EKG demonstrating narrow complex tachycardia concerning for atrial tachycardia versus atypical atrial flutter with a rate of 133 bpm.  Repeat tracing showed sinus rhythm with occasional isolated PVCs.  After discussion with the patient, anticoagulation was deferred with recommendation to pursue outpatient cardiac monitoring and started Toprol -XL.  He was also scheduled for echo and coronary CTA for further risk stratification.  Echo on 11/03/2024 demonstrated an EF of 35 to 40%, mildly  dilated LV cavity size, normal RV systolic function with moderately enlarged RV cavity, moderately dilated right atrium, mild mitral regurgitation, mild calcification and aortic sclerosis with mild aortic insufficiency.  He comes in today continuing to note longstanding exertional shortness of breath that is unchanged.  No frank chest pain.  Continues to workout at the gym regularly without associated shortness of breath or chest pain.  He does notice some positional dizziness if he stands up quickly.  No near-syncope or syncope.  No falls or symptoms concerning for bleeding.  No significant lower extremity swelling or orthopnea.  No palpitations.  Not currently taking baby aspirin .  Tolerating rosuvastatin  10 mg every other day, notes diarrhea with daily dosing.  Weight down 4 pounds today when compared to visit in 09/2024.  He does not have any acute cardiac concerns at this time.   Labs independently reviewed: 08/2024 - TC 195, TG 110, HDL 43, LDL 130, A1c 6.4, Hgb 14.0, PLT 251, TSH normal, BUN 16, serum creatinine 0.84, potassium 4.4, albumin 4.6, AST/ALT normal   Past Medical History:  Diagnosis Date   Aortic atherosclerosis    Arthritis    Asthma    patient reports as a child   CAD (coronary artery disease)    a.  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   Social History   Socioeconomic History   Marital status: Widowed    Spouse name: Not on file   Number of children: 2   Years of education: Not on file   Highest education level: Not on file  Occupational History   Occupation: retired  Tobacco Use   Smoking status: Every Day    Current packs/day: 0.50    Average packs/day: 0.5 packs/day for 57.0 years (28.5 ttl pk-yrs)    Types: Cigarettes   Smokeless tobacco: Never   Tobacco comments:    1 cigarette in the last month   Vaping Use   Vaping status: Never Used  Substance and Sexual Activity   Alcohol use: Yes    Alcohol/week: 2.0 standard drinks of alcohol    Types: 2 Glasses of wine per week   Drug use: Never   Sexual activity: Not on file  Other Topics Concern   Not on file  Social History Narrative   Lives alone   Social Drivers of Health   Tobacco Use: High Risk (09/30/2024)   Patient History    Smoking Tobacco Use: Every Day    Smokeless Tobacco Use: Never    Passive Exposure: Not on file  Financial Resource Strain: Low Risk  (07/03/2024)   [facility]   Overall Financial Resource Strain (CARDIA)    Difficulty of Paying Living Expenses: Not hard at all  Food Insecurity: No Food Insecurity (07/03/2024)   Received from [facility]   Epic    Within the past 12 months, you worried that your food would run out before you got the money to buy more.: Never true    Within the past 12 months, the food you bought just didn't last and you didn't have money to get more.: Never true  Transportation Needs: No Transportation Needs (07/03/2024)   Received from [facility]    In the past 12 months, has lack of transportation kept you from medical appointments or from getting  medications?: No    Lack of Transportation (Non-Medical): No  Physical Activity: Insufficiently Active (11/09/2022)   Exercise Vital Sign    Days of Exercise per Week: 2 days    Minutes of Exercise per Session: 20 min  Stress: No Stress Concern Present (11/09/2022)   [employer] of [employer]ational [employer] - Occupational Stress Questionnaire    Feeling of Stress : Not at all  Social Connections: Moderately Isolated (11/09/2022)   Social Connection and Isolation Panel    Frequency of Communication with Friends and Family: Three times a week    Frequency of Social Gatherings with Friends and Family: Twice a week    Attends Religious Services: 1 to 4 times per year    Active Member of [employer] or Organizations: No    Attends Banker Meetings: Never    Marital Status: Widowed  Depression (PHQ2-9): Low Risk (09/28/2024)   Depression (PHQ2-9)    PHQ-2 Score: 0  Alcohol Screen: Low Risk (09/03/2023)   Alcohol Screen    Last Alcohol Screening Score (AUDIT): 3  Housing: Low Risk  (07/03/2024)   Received from [facility]   Epic    In the last 12 months, was there a time when you were not able to pay the mortgage or rent on time?: No    In the past 12 months, how many times have you moved where you were living?: 0    At any time in the past 12 months, were you homeless or living in a shelter (including now)?: No  Utilities: Not At Risk (07/03/2024)   Received from [facility]   Epic    In the past 12 months has the electric, gas, oil, or water company threatened to shut off services in your home?: No  Health Literacy: Not on file     Family History:  The patient's family history includes Alzheimer's disease in his mother; Diabetes in his father; Prostate cancer in his brother.  ROS:   12-point review of systems is negative unless otherwise noted in the HPI.   EKGs/Labs/Other Studies Reviewed:    Studies reviewed were summarized above. The  additional studies were reviewed today:  2D echo 11/03/2024: 1. Left ventricular ejection fraction, by estimation, is 35 to 40%. Left  ventricular ejection fraction by 3D volume is 40 %. Left ventricular  ejection fraction by 2D MOD biplane is 38.5 %. The left ventricle has  moderately decreased function. Left  ventricular endocardial border not optimally defined to evaluate regional  wall motion. The left ventricular internal cavity size was mildly dilated.  Indeterminate diastolic filling due to E-A fusion.   2. Right ventricular systolic function is normal. The right ventricular  size is moderately enlarged.   3. Right atrial size was moderately dilated.   4. The mitral valve is degenerative. Mild mitral valve regurgitation. No  evidence of mitral stenosis.   5. The aortic valve was not well visualized. There is mild calcification  of the aortic valve. Aortic valve regurgitation is mild. Aortic valve  sclerosis/calcification is present, without any evidence of aortic  stenosis.    EKG:  EKG is ordered today.  The EKG ordered today demonstrates NSR, 90 bpm, left axis deviation, rare PVC, poor R wave progression along the precordial leads, no acute ST-T changes  Recent Labs: 09/10/2024: ALT 19; BUN 16; Creat 0.84; Hemoglobin 14.0; Platelets 251; Potassium 4.4; Sodium 139; TSH 3.42  Recent Lipid Panel    Component Value Date/Time   CHOL 195 09/10/2024 0820   TRIG 110 09/10/2024 0820   HDL 43 09/10/2024 0820   CHOLHDL 4.5 09/10/2024 0820   LDLCALC 130 (H) 09/10/2024 0820    PHYSICAL EXAM:    VS:  BP 121/72 (BP Location: Left Arm, Patient Position: Sitting, Cuff Size: Normal)   Pulse 90   Ht 6' 4 (1.93 m)   Wt 258 lb (117 kg)   SpO2 93%   BMI 31.40 kg/m   BMI: Body mass index is 31.4 kg/m.  Physical Exam Vitals reviewed.  Constitutional:      Appearance: He is well-developed.  HENT:     Head: Normocephalic and atraumatic.  Eyes:     General:        Right eye: No  discharge.        Left eye: No discharge.  Cardiovascular:     Rate and Rhythm: Normal rate and regular rhythm.     Heart sounds:  Normal heart sounds, S1 normal and S2 normal. Heart sounds not distant. No midsystolic click and no opening snap. No murmur heard.    No friction rub.  Pulmonary:     Effort: Pulmonary effort is normal. No respiratory distress.     Breath sounds: Normal breath sounds. No decreased breath sounds, wheezing, rhonchi or rales.  Musculoskeletal:     Cervical back: Normal range of motion.  Skin:    General: Skin is warm and dry.     Nails: There is no clubbing.  Neurological:     Mental Status: He is alert and oriented to person, place, and time.  Psychiatric:        Speech: Speech normal.        Behavior: Behavior normal.        Thought Content: Thought content normal.        Judgment: Judgment normal.     Wt Readings from Last 3 Encounters:  11/04/24 258 lb (117 kg)  09/30/24 262 lb (118.8 kg)  09/28/24 264 lb 8 oz (120 kg)     ASSESSMENT & PLAN:   HFrEF: Euvolemic and well compensated.  Pursue R/LHC for further evaluation of cardiomyopathy and to better understand hemodynamics.  Cancel coronary CTA.  We did discuss transitioning losartan  to Entresto, however he just recently received a 90-day supply of losartan  and prefers to continue this at this time.  Therefore, he will be continued on losartan  50 mg and Toprol -XL 25 mg daily.  Following cardiac cath, recommend further escalation of GDMT with transition from ARB to ARNI, along with addition of SGLT2 inhibitor and MRA to further optimize pharmacotherapy.  Patient will need follow-up echo in approximately 3 months pending cardiac catheter results and on optimal evidence-based medical therapy to reevaluate LV systolic function.  CHF education.  Not currently requiring standing loop diuretic.  Multivessel coronary artery calcification: Noted on prior lung cancer screening CTs dating back to 10/2018.  He has  multiple risk factors including male status with age 62 years, DM2, tobacco use, aortic atherosclerosis, HTN, HLD, and obesity.  Recent echo demonstrated cardiomyopathy of uncertain chronicity as outlined above with recommendation to pursue diagnostic R/LHC.  Start aspirin  81 mg daily (was previously on his list though was not taking), and rosuvastatin  as outlined below.    Narrow complex tachycardia: No symptoms of palpitations.  Preliminary review of Zio patch shows a sinus rhythm with rates ranging from 60 to 116 bpm in sinus, 3 episodes of nonsustained SVT and 1 episode of NSVT.  Await finalized review.  Remains on Toprol -XL 25 mg as outlined above.  Aortic atherosclerosis/HLD: LDL 130 in 08/2024 with target LDL at least less than 70.  Check LFT, lipid panel, and direct LDL.  For now, he will remain on rosuvastatin  10 mg every other day as he reports diarrhea on higher doses and with more frequent dosing.  We will trend lipid panel with recommendation to undergo rechallenge of rosuvastatin  titration and if diarrhea returns pursue PCSK9 inhibitor versus bempedoic acid.  HTN: Blood pressure is well-controlled in the office today.  Pharmacotherapy as outlined above.  COPD with prior tobacco use: No acute exacerbation.    DM2 with peripheral neuropathy: A1c 6.4.  Management per PCP.   Informed Consent   Shared Decision Making/Informed Consent{  The risks [stroke (1 in 1000), death (1 in 1000), kidney failure [usually temporary] (1 in 500), bleeding (1 in 200), allergic reaction [possibly serious] (1 in 200)], benefits (diagnostic support and management of  coronary artery disease) and alternatives of a cardiac catheterization were discussed in detail with Mr. Dalzell and he is willing to proceed.        Disposition: F/u with me 1-2 weeks after [facility] with plans to establish with MD thereafter.   Medication Adjustments/Labs and Tests Ordered: Current medicines are reviewed at length with the  patient today.  Concerns regarding medicines are outlined above. Medication changes, Labs and Tests ordered today are summarized above and listed in the Patient Instructions accessible in Encounters.   SignedBernardino Bring, PA-C 11/04/2024 4:45 PM     [facility] [address] [place], [place] [address] [phone]     [1]  Current Meds  Medication Sig   fluticasone  (FLONASE ) 50 MCG/ACT nasal spray Place 2 sprays into both nostrils daily as needed.   Glucosamine-Chondroitin (OSTEO BI-FLEX REGULAR STRENGTH PO) Take by mouth as needed.   losartan  (COZAAR ) 50 MG tablet Take 1 tablet (50 mg total) by mouth daily.   Melatonin 5 MG CAPS Take 5 mg by mouth at bedtime as needed (sleep).   metoprolol  succinate (TOPROL  XL) 25 MG 24 hr tablet Take 1 tablet (25 mg total) by mouth daily.   rosuvastatin  (CRESTOR ) 10 MG tablet Take 1 tablet (10 mg total) by mouth every other day.   "

## 2024-11-05 LAB — LDL CHOLESTEROL, DIRECT: Direct LDL: 100 mg/dL — ABNORMAL HIGH (ref 0–99)

## 2024-11-06 ENCOUNTER — Other Ambulatory Visit: Payer: Self-pay | Admitting: Family Medicine

## 2024-11-06 ENCOUNTER — Ambulatory Visit: Payer: Self-pay | Admitting: Physician Assistant

## 2024-11-06 DIAGNOSIS — E1169 Type 2 diabetes mellitus with other specified complication: Secondary | ICD-10-CM

## 2024-11-06 DIAGNOSIS — D72829 Elevated white blood cell count, unspecified: Secondary | ICD-10-CM

## 2024-11-06 DIAGNOSIS — E7800 Pure hypercholesterolemia, unspecified: Secondary | ICD-10-CM

## 2024-11-06 DIAGNOSIS — I5021 Acute systolic (congestive) heart failure: Secondary | ICD-10-CM

## 2024-11-06 MED ORDER — ROSUVASTATIN CALCIUM 10 MG PO TABS: 10.0000 mg | ORAL_TABLET | Freq: Every day | ORAL | 3 refills | Status: AC

## 2024-11-06 NOTE — Telephone Encounter (Signed)
 The patient has been notified (per DPR VM and VM to daughter) of the results along with recommendations - reminded of upcoming appointment and encouraged to call if any concerns or questions arise  Letter sent, meds updated, and routed to PCP

## 2024-11-06 NOTE — Addendum Note (Signed)
 Addended by: HANNAH MARCUS KIDD on: 11/06/2024 05:19 PM   Modules accepted: Orders

## 2024-11-09 DIAGNOSIS — R0602 Shortness of breath: Secondary | ICD-10-CM | POA: Diagnosis not present

## 2024-11-10 MED ORDER — METOPROLOL SUCCINATE ER 50 MG PO TB24: 50.0000 mg | ORAL_TABLET | Freq: Every day | ORAL | 3 refills | Status: AC

## 2024-11-12 ENCOUNTER — Telehealth: Payer: Self-pay | Admitting: *Deleted

## 2024-11-12 NOTE — Telephone Encounter (Signed)
 Cardiac Cath scheduled at [facility]  for: Monday November 16, 2024 9:30 AM Arrival time Heart & Vascular Entrance [facility] at: 8:30 AM  Diet: -Nothing to eat after midnight.  Hydration: -May drink clear liquids until 2 hours before the procedure.  Approved liquids: Water, clear tea, black coffee, fruit juices-non-citric and without pulp,Gatorade, plain Jello/popsicles.   -Please drink 8 oz of water 2 hours before procedure.   Medication instructions: -Usual morning medications can be taken including aspirin  81 mg.  Plan to go home the same day, you will only stay overnight if medically necessary.  You must have responsible adult to drive you home.  Someone must be with you the first 24 hours after you arrive home.  Reviewed procedure instructions with patient's daughter (DPR), Jon.

## 2024-11-16 ENCOUNTER — Other Ambulatory Visit: Payer: Self-pay

## 2024-11-16 ENCOUNTER — Encounter: Payer: Self-pay | Admitting: Cardiovascular Disease

## 2024-11-16 ENCOUNTER — Encounter
Admission: RE | Disposition: A | Discharge status: Home / Self Care | Payer: Self-pay | Source: Home / Self Care | Attending: Cardiovascular Disease

## 2024-11-16 ENCOUNTER — Ambulatory Visit
Admission: RE | Admit: 2024-11-16 | Discharge: 2024-11-16 | Disposition: A | Discharge status: Home / Self Care | Attending: Cardiovascular Disease | Admitting: Cardiovascular Disease

## 2024-11-16 DIAGNOSIS — Z7982 Long term (current) use of aspirin: Secondary | ICD-10-CM | POA: Insufficient documentation

## 2024-11-16 DIAGNOSIS — E1142 Type 2 diabetes mellitus with diabetic polyneuropathy: Secondary | ICD-10-CM | POA: Diagnosis not present

## 2024-11-16 DIAGNOSIS — I700 Atherosclerosis of aorta: Secondary | ICD-10-CM | POA: Insufficient documentation

## 2024-11-16 DIAGNOSIS — I5020 Unspecified systolic (congestive) heart failure: Secondary | ICD-10-CM | POA: Diagnosis present

## 2024-11-16 DIAGNOSIS — I25118 Atherosclerotic heart disease of native coronary artery with other forms of angina pectoris: Secondary | ICD-10-CM | POA: Diagnosis not present

## 2024-11-16 DIAGNOSIS — Z79899 Other long term (current) drug therapy: Secondary | ICD-10-CM | POA: Insufficient documentation

## 2024-11-16 DIAGNOSIS — Z87891 Personal history of nicotine dependence: Secondary | ICD-10-CM | POA: Insufficient documentation

## 2024-11-16 DIAGNOSIS — J4489 Other specified chronic obstructive pulmonary disease: Secondary | ICD-10-CM | POA: Insufficient documentation

## 2024-11-16 DIAGNOSIS — E785 Hyperlipidemia, unspecified: Secondary | ICD-10-CM | POA: Insufficient documentation

## 2024-11-16 DIAGNOSIS — I5021 Acute systolic (congestive) heart failure: Secondary | ICD-10-CM | POA: Diagnosis not present

## 2024-11-16 DIAGNOSIS — M170 Bilateral primary osteoarthritis of knee: Secondary | ICD-10-CM | POA: Insufficient documentation

## 2024-11-16 DIAGNOSIS — I2510 Atherosclerotic heart disease of native coronary artery without angina pectoris: Secondary | ICD-10-CM

## 2024-11-16 DIAGNOSIS — I110 Hypertensive heart disease with heart failure: Secondary | ICD-10-CM | POA: Insufficient documentation

## 2024-11-16 HISTORY — PX: CORONARY PRESSURE/FFR WITH 3D MAPPING: CATH118309

## 2024-11-16 HISTORY — PX: RIGHT/LEFT HEART CATH AND CORONARY ANGIOGRAPHY: CATH118266

## 2024-11-16 LAB — POCT I-STAT EG7
Acid-Base Excess: 0 mmol/L (ref 0.0–2.0)
Bicarbonate: 26.6 mmol/L (ref 20.0–28.0)
Calcium, Ion: 1.28 mmol/L (ref 1.15–1.40)
HCT: 41 % (ref 39.0–52.0)
Hemoglobin: 13.9 g/dL (ref 13.0–17.0)
O2 Saturation: 63 %
Potassium: 4.1 mmol/L (ref 3.5–5.1)
Sodium: 142 mmol/L (ref 135–145)
TCO2: 28 mmol/L (ref 22–32)
pCO2, Ven: 50 mmHg (ref 44–60)
pH, Ven: 7.335 (ref 7.25–7.43)
pO2, Ven: 35 mmHg (ref 32–45)

## 2024-11-16 MED ORDER — ASPIRIN 81 MG PO CHEW: 81.0000 mg | CHEWABLE_TABLET | ORAL | Status: DC

## 2024-11-16 MED ORDER — SODIUM CHLORIDE 0.9% FLUSH: 3.0000 mL | INTRAVENOUS | Status: DC | PRN

## 2024-11-16 MED ORDER — HEPARIN SODIUM (PORCINE) 1000 UNIT/ML IJ SOLN
INTRAMUSCULAR | Status: AC
  Filled 2024-11-16: qty 10

## 2024-11-16 MED ORDER — LIDOCAINE HCL (PF) 1 % IJ SOLN
INTRAMUSCULAR | Status: DC | PRN
  Administered 2024-11-16 (×2): 2 mL

## 2024-11-16 MED ORDER — MIDAZOLAM HCL (PF) 2 MG/2ML IJ SOLN
INTRAMUSCULAR | Status: DC | PRN
  Administered 2024-11-16: 1 mg via INTRAVENOUS

## 2024-11-16 MED ORDER — FENTANYL CITRATE (PF) 100 MCG/2ML IJ SOLN
INTRAMUSCULAR | Status: DC | PRN
  Administered 2024-11-16: 25 ug via INTRAVENOUS

## 2024-11-16 MED ORDER — HEPARIN (PORCINE) IN NACL 1000-0.9 UT/500ML-% IV SOLN
INTRAVENOUS | Status: DC | PRN
  Administered 2024-11-16: 1000 mL

## 2024-11-16 MED ORDER — IOHEXOL 300 MG/ML  SOLN
INTRAMUSCULAR | Status: DC | PRN
  Administered 2024-11-16: 90 mL

## 2024-11-16 MED ORDER — FREE WATER: 500.0000 mL | Freq: Once | Status: DC

## 2024-11-16 MED ORDER — ACETAMINOPHEN 325 MG PO TABS: 650.0000 mg | ORAL_TABLET | ORAL | Status: DC | PRN

## 2024-11-16 MED ORDER — NITROGLYCERIN 1 MG/10 ML FOR IR/CATH LAB
INTRA_ARTERIAL | Status: DC | PRN
  Administered 2024-11-16: 200 ug via INTRACORONARY

## 2024-11-16 MED ORDER — HEPARIN SODIUM (PORCINE) 1000 UNIT/ML IJ SOLN
INTRAMUSCULAR | Status: DC | PRN
  Administered 2024-11-16: 5000 [IU] via INTRAVENOUS
  Administered 2024-11-16: 6000 [IU] via INTRAVENOUS

## 2024-11-16 MED ORDER — VERAPAMIL HCL 2.5 MG/ML IV SOLN
INTRAVENOUS | Status: AC
  Filled 2024-11-16: qty 2

## 2024-11-16 MED ORDER — SODIUM CHLORIDE 0.9 % IV SOLN
250.0000 mL | INTRAVENOUS | Status: DC | PRN
  Administered 2024-11-16: 250 mL via INTRAVENOUS

## 2024-11-16 MED ORDER — SODIUM CHLORIDE 0.9% FLUSH: 3.0000 mL | Freq: Two times a day (BID) | INTRAVENOUS | Status: DC

## 2024-11-16 MED ORDER — VERAPAMIL HCL 2.5 MG/ML IV SOLN
INTRAVENOUS | Status: DC | PRN
  Administered 2024-11-16: 2.5 mg via INTRA_ARTERIAL

## 2024-11-16 MED ORDER — SODIUM CHLORIDE 0.9 % IV SOLN: 250.0000 mL | INTRAVENOUS | Status: DC | PRN

## 2024-11-16 MED ORDER — ONDANSETRON HCL 4 MG/2ML IJ SOLN: 4.0000 mg | Freq: Four times a day (QID) | INTRAMUSCULAR | Status: DC | PRN

## 2024-11-16 MED ORDER — MIDAZOLAM HCL 2 MG/2ML IJ SOLN
INTRAMUSCULAR | Status: AC
  Filled 2024-11-16: qty 2

## 2024-11-16 MED ORDER — LIDOCAINE HCL 1 % IJ SOLN
INTRAMUSCULAR | Status: AC
  Filled 2024-11-16: qty 20

## 2024-11-16 MED ORDER — NITROGLYCERIN 1 MG/10 ML FOR IR/CATH LAB
INTRA_ARTERIAL | Status: AC
  Filled 2024-11-16: qty 10

## 2024-11-16 MED ORDER — FENTANYL CITRATE (PF) 100 MCG/2ML IJ SOLN
INTRAMUSCULAR | Status: AC
  Filled 2024-11-16: qty 2

## 2024-11-16 NOTE — Interval H&P Note (Signed)
 History and Physical Interval Note:  11/16/2024 9:44 AM  Jesse Gardner  has presented today for surgery, with the diagnosis of R and L Cath    HFrEF.  The various methods of treatment have been discussed with the patient and family. After consideration of risks, benefits and other options for treatment, the patient has consented to  Procedures: RIGHT/LEFT HEART CATH AND CORONARY ANGIOGRAPHY (Bilateral) as a surgical intervention.  The patient's history has been reviewed, patient examined, no change in status, stable for surgery.  I have reviewed the patient's chart and labs.  Questions were answered to the patient's satisfaction.     Mick Tanguma

## 2024-11-17 LAB — POCT I-STAT 7, (LYTES, BLD GAS, ICA,H+H)
Acid-base deficit: 1 mmol/L (ref 0.0–2.0)
Bicarbonate: 25.4 mmol/L (ref 20.0–28.0)
Calcium, Ion: 1.27 mmol/L (ref 1.15–1.40)
HCT: 42 % (ref 39.0–52.0)
Hemoglobin: 14.3 g/dL (ref 13.0–17.0)
O2 Saturation: 87 %
Potassium: 4.2 mmol/L (ref 3.5–5.1)
Sodium: 141 mmol/L (ref 135–145)
TCO2: 27 mmol/L (ref 22–32)
pCO2 arterial: 46.2 mmHg (ref 32–48)
pH, Arterial: 7.348 — ABNORMAL LOW (ref 7.35–7.45)
pO2, Arterial: 56 mmHg — ABNORMAL LOW (ref 83–108)

## 2024-11-17 LAB — POCT ACTIVATED CLOTTING TIME: Activated Clotting Time: 220 s

## 2024-11-24 ENCOUNTER — Ambulatory Visit: Admitting: Physician Assistant

## 2024-12-01 ENCOUNTER — Ambulatory Visit: Admitting: Physician Assistant

## 2024-12-01 ENCOUNTER — Encounter: Payer: Self-pay | Admitting: Physician Assistant

## 2024-12-01 VITALS — BP 120/60 | HR 84 | Ht 76.0 in | Wt 267.0 lb

## 2024-12-01 DIAGNOSIS — I428 Other cardiomyopathies: Secondary | ICD-10-CM

## 2024-12-01 DIAGNOSIS — E785 Hyperlipidemia, unspecified: Secondary | ICD-10-CM | POA: Diagnosis not present

## 2024-12-01 DIAGNOSIS — I2510 Atherosclerotic heart disease of native coronary artery without angina pectoris: Secondary | ICD-10-CM

## 2024-12-01 DIAGNOSIS — Z87891 Personal history of nicotine dependence: Secondary | ICD-10-CM | POA: Diagnosis not present

## 2024-12-01 DIAGNOSIS — I700 Atherosclerosis of aorta: Secondary | ICD-10-CM

## 2024-12-01 DIAGNOSIS — I10 Essential (primary) hypertension: Secondary | ICD-10-CM | POA: Diagnosis not present

## 2024-12-01 DIAGNOSIS — I471 Supraventricular tachycardia, unspecified: Secondary | ICD-10-CM | POA: Diagnosis not present

## 2024-12-01 DIAGNOSIS — J449 Chronic obstructive pulmonary disease, unspecified: Secondary | ICD-10-CM

## 2024-12-01 DIAGNOSIS — I4729 Other ventricular tachycardia: Secondary | ICD-10-CM | POA: Diagnosis not present

## 2024-12-01 DIAGNOSIS — I5020 Unspecified systolic (congestive) heart failure: Secondary | ICD-10-CM

## 2025-03-01 ENCOUNTER — Ambulatory Visit: Admitting: Physician Assistant

## 2025-03-23 ENCOUNTER — Other Ambulatory Visit

## 2025-03-29 ENCOUNTER — Ambulatory Visit: Admitting: Family Medicine
